# Patient Record
Sex: Female | Born: 1935 | Race: White | Hispanic: No | Smoking: Never smoker
Health system: Southern US, Community
[De-identification: ages and names within clinical notes are randomized; demographics above are authoritative.]

## PROBLEM LIST (undated history)

## (undated) DIAGNOSIS — F419 Anxiety disorder, unspecified: Secondary | ICD-10-CM

## (undated) DIAGNOSIS — K8020 Calculus of gallbladder without cholecystitis without obstruction: Secondary | ICD-10-CM

## (undated) DIAGNOSIS — I714 Abdominal aortic aneurysm, without rupture, unspecified: Secondary | ICD-10-CM

## (undated) DIAGNOSIS — E785 Hyperlipidemia, unspecified: Secondary | ICD-10-CM

## (undated) DIAGNOSIS — B0229 Other postherpetic nervous system involvement: Secondary | ICD-10-CM

## (undated) DIAGNOSIS — K5730 Diverticulosis of large intestine without perforation or abscess without bleeding: Secondary | ICD-10-CM

## (undated) DIAGNOSIS — I639 Cerebral infarction, unspecified: Secondary | ICD-10-CM

## (undated) DIAGNOSIS — I8390 Asymptomatic varicose veins of unspecified lower extremity: Secondary | ICD-10-CM

## (undated) DIAGNOSIS — Z85038 Personal history of other malignant neoplasm of large intestine: Secondary | ICD-10-CM

## (undated) DIAGNOSIS — I10 Essential (primary) hypertension: Secondary | ICD-10-CM

## (undated) DIAGNOSIS — R011 Cardiac murmur, unspecified: Secondary | ICD-10-CM

## (undated) DIAGNOSIS — B0239 Other herpes zoster eye disease: Secondary | ICD-10-CM

## (undated) DIAGNOSIS — M1990 Unspecified osteoarthritis, unspecified site: Secondary | ICD-10-CM

## (undated) DIAGNOSIS — C801 Malignant (primary) neoplasm, unspecified: Secondary | ICD-10-CM

## (undated) HISTORY — DX: Malignant (primary) neoplasm, unspecified: C80.1

## (undated) HISTORY — DX: Unspecified osteoarthritis, unspecified site: M19.90

## (undated) HISTORY — DX: Abdominal aortic aneurysm, without rupture, unspecified: I71.40

## (undated) HISTORY — DX: Abdominal aortic aneurysm, without rupture: I71.4

## (undated) HISTORY — DX: Hyperlipidemia, unspecified: E78.5

## (undated) HISTORY — PX: EYE SURGERY: SHX253

## (undated) HISTORY — PX: COLON SURGERY: SHX602

## (undated) HISTORY — DX: Anxiety disorder, unspecified: F41.9

## (undated) HISTORY — DX: Other herpes zoster eye disease: B02.39

## (undated) HISTORY — DX: Personal history of other malignant neoplasm of large intestine: Z85.038

## (undated) HISTORY — PX: TONSILLECTOMY: SUR1361

## (undated) HISTORY — DX: Cardiac murmur, unspecified: R01.1

## (undated) HISTORY — DX: Diverticulosis of large intestine without perforation or abscess without bleeding: K57.30

## (undated) HISTORY — DX: Calculus of gallbladder without cholecystitis without obstruction: K80.20

## (undated) HISTORY — DX: Asymptomatic varicose veins of unspecified lower extremity: I83.90

## (undated) HISTORY — DX: Essential (primary) hypertension: I10

## (undated) HISTORY — DX: Other postherpetic nervous system involvement: B02.29

---

## 1993-04-22 DIAGNOSIS — Z85038 Personal history of other malignant neoplasm of large intestine: Secondary | ICD-10-CM

## 1993-04-22 HISTORY — DX: Personal history of other malignant neoplasm of large intestine: Z85.038

## 1999-11-20 ENCOUNTER — Encounter: Payer: Self-pay | Admitting: Internal Medicine

## 1999-11-20 ENCOUNTER — Ambulatory Visit (HOSPITAL_COMMUNITY): Admission: RE | Admit: 1999-11-20 | Discharge: 1999-11-20 | Payer: Self-pay | Admitting: Internal Medicine

## 2000-04-22 DIAGNOSIS — B0230 Zoster ocular disease, unspecified: Secondary | ICD-10-CM

## 2000-04-22 HISTORY — DX: Zoster ocular disease, unspecified: B02.30

## 2000-12-08 ENCOUNTER — Other Ambulatory Visit: Admission: RE | Admit: 2000-12-08 | Discharge: 2000-12-08 | Payer: Self-pay | Admitting: Obstetrics and Gynecology

## 2002-02-23 ENCOUNTER — Other Ambulatory Visit: Admission: RE | Admit: 2002-02-23 | Discharge: 2002-02-23 | Payer: Self-pay | Admitting: Obstetrics and Gynecology

## 2003-03-28 ENCOUNTER — Other Ambulatory Visit: Admission: RE | Admit: 2003-03-28 | Discharge: 2003-03-28 | Payer: Self-pay | Admitting: Obstetrics and Gynecology

## 2004-10-04 ENCOUNTER — Ambulatory Visit: Payer: Self-pay | Admitting: Internal Medicine

## 2004-10-16 ENCOUNTER — Other Ambulatory Visit: Admission: RE | Admit: 2004-10-16 | Discharge: 2004-10-16 | Payer: Self-pay | Admitting: Obstetrics and Gynecology

## 2004-12-13 ENCOUNTER — Ambulatory Visit: Payer: Self-pay | Admitting: Gastroenterology

## 2005-01-09 ENCOUNTER — Ambulatory Visit: Payer: Self-pay | Admitting: Gastroenterology

## 2005-03-07 ENCOUNTER — Ambulatory Visit: Payer: Self-pay | Admitting: Internal Medicine

## 2005-07-20 ENCOUNTER — Inpatient Hospital Stay (HOSPITAL_COMMUNITY): Admission: EM | Admit: 2005-07-20 | Discharge: 2005-07-21 | Payer: Self-pay | Admitting: Emergency Medicine

## 2005-07-20 ENCOUNTER — Encounter: Payer: Self-pay | Admitting: Emergency Medicine

## 2005-11-18 ENCOUNTER — Ambulatory Visit: Payer: Self-pay | Admitting: Internal Medicine

## 2005-12-12 ENCOUNTER — Ambulatory Visit: Payer: Self-pay | Admitting: Internal Medicine

## 2006-02-19 ENCOUNTER — Ambulatory Visit: Payer: Self-pay | Admitting: Internal Medicine

## 2006-03-07 ENCOUNTER — Ambulatory Visit: Payer: Self-pay | Admitting: Internal Medicine

## 2006-06-26 ENCOUNTER — Ambulatory Visit: Payer: Self-pay | Admitting: Internal Medicine

## 2006-06-30 ENCOUNTER — Encounter: Payer: Self-pay | Admitting: Internal Medicine

## 2006-07-25 DIAGNOSIS — D126 Benign neoplasm of colon, unspecified: Secondary | ICD-10-CM | POA: Insufficient documentation

## 2006-07-25 DIAGNOSIS — C189 Malignant neoplasm of colon, unspecified: Secondary | ICD-10-CM | POA: Insufficient documentation

## 2006-08-04 ENCOUNTER — Ambulatory Visit: Payer: Self-pay | Admitting: Internal Medicine

## 2006-08-04 LAB — CONVERTED CEMR LAB
Sed Rate: 16 mm/hr (ref 0–25)
Total CK: 48 units/L (ref 7–177)

## 2006-11-07 ENCOUNTER — Encounter: Payer: Self-pay | Admitting: Internal Medicine

## 2006-11-20 ENCOUNTER — Ambulatory Visit: Payer: Self-pay | Admitting: Internal Medicine

## 2006-11-20 DIAGNOSIS — B0229 Other postherpetic nervous system involvement: Secondary | ICD-10-CM | POA: Insufficient documentation

## 2006-11-20 DIAGNOSIS — I10 Essential (primary) hypertension: Secondary | ICD-10-CM

## 2006-11-26 ENCOUNTER — Telehealth (INDEPENDENT_AMBULATORY_CARE_PROVIDER_SITE_OTHER): Payer: Self-pay | Admitting: *Deleted

## 2007-01-26 ENCOUNTER — Ambulatory Visit: Payer: Self-pay | Admitting: Internal Medicine

## 2007-01-26 DIAGNOSIS — E782 Mixed hyperlipidemia: Secondary | ICD-10-CM

## 2007-01-26 DIAGNOSIS — E041 Nontoxic single thyroid nodule: Secondary | ICD-10-CM

## 2007-01-26 DIAGNOSIS — M810 Age-related osteoporosis without current pathological fracture: Secondary | ICD-10-CM | POA: Insufficient documentation

## 2007-01-29 ENCOUNTER — Encounter: Admission: RE | Admit: 2007-01-29 | Discharge: 2007-01-29 | Payer: Self-pay | Admitting: Internal Medicine

## 2007-01-30 ENCOUNTER — Encounter (INDEPENDENT_AMBULATORY_CARE_PROVIDER_SITE_OTHER): Payer: Self-pay | Admitting: *Deleted

## 2007-02-09 ENCOUNTER — Ambulatory Visit: Payer: Self-pay | Admitting: Internal Medicine

## 2007-02-10 ENCOUNTER — Encounter (INDEPENDENT_AMBULATORY_CARE_PROVIDER_SITE_OTHER): Payer: Self-pay | Admitting: *Deleted

## 2007-03-04 ENCOUNTER — Ambulatory Visit: Payer: Self-pay | Admitting: Internal Medicine

## 2007-04-10 ENCOUNTER — Ambulatory Visit: Payer: Self-pay | Admitting: Internal Medicine

## 2007-04-20 ENCOUNTER — Encounter (INDEPENDENT_AMBULATORY_CARE_PROVIDER_SITE_OTHER): Payer: Self-pay | Admitting: *Deleted

## 2007-04-20 LAB — CONVERTED CEMR LAB
Cholesterol: 184 mg/dL (ref 0–200)
HDL: 40.7 mg/dL (ref >39.0)
Total CHOL/HDL Ratio: 4.5

## 2008-01-20 ENCOUNTER — Ambulatory Visit: Payer: Self-pay | Admitting: Internal Medicine

## 2008-06-14 ENCOUNTER — Ambulatory Visit: Payer: Self-pay | Admitting: Internal Medicine

## 2008-06-14 DIAGNOSIS — K5730 Diverticulosis of large intestine without perforation or abscess without bleeding: Secondary | ICD-10-CM | POA: Insufficient documentation

## 2008-06-14 DIAGNOSIS — Z85038 Personal history of other malignant neoplasm of large intestine: Secondary | ICD-10-CM | POA: Insufficient documentation

## 2008-06-16 ENCOUNTER — Ambulatory Visit: Payer: Self-pay | Admitting: Internal Medicine

## 2008-06-20 ENCOUNTER — Encounter: Admission: RE | Admit: 2008-06-20 | Discharge: 2008-06-20 | Payer: Self-pay | Admitting: Internal Medicine

## 2008-06-21 LAB — CONVERTED CEMR LAB
ALT: 15 units/L (ref 0–35)
Basophils Absolute: 0 10*3/uL (ref 0.0–0.1)
Basophils Relative: 0.7 % (ref 0.0–3.0)
Bilirubin, Direct: 0.1 mg/dL (ref 0.0–0.3)
Cholesterol: 161 mg/dL (ref 0–200)
Eosinophils Relative: 5.8 % — ABNORMAL HIGH (ref 0.0–5.0)
HCT: 39 % (ref 36.0–46.0)
HDL: 46.2 mg/dL (ref >39.0)
Hemoglobin: 13.2 g/dL (ref 12.0–15.0)
LDL Cholesterol: 92 mg/dL (ref 0–99)
Lymphocytes Relative: 30.9 % (ref 12.0–46.0)
MCV: 85.1 fL (ref 78.0–100.0)
Monocytes Absolute: 0.4 10*3/uL (ref 0.1–1.0)
Neutro Abs: 2.8 10*3/uL (ref 1.4–7.7)
Neutrophils Relative %: 55.4 % (ref 43.0–77.0)
Platelets: 189 10*3/uL (ref 150–400)
RBC: 4.59 M/uL (ref 3.87–5.11)
RDW: 12.6 % (ref 11.5–14.6)
TSH: 1.06 microintl units/mL (ref 0.35–5.50)
VLDL: 22 mg/dL (ref 0–40)

## 2008-06-22 ENCOUNTER — Encounter (INDEPENDENT_AMBULATORY_CARE_PROVIDER_SITE_OTHER): Payer: Self-pay | Admitting: *Deleted

## 2008-10-25 ENCOUNTER — Encounter: Payer: Self-pay | Admitting: Internal Medicine

## 2009-03-06 ENCOUNTER — Ambulatory Visit: Payer: Self-pay | Admitting: Internal Medicine

## 2009-09-06 ENCOUNTER — Ambulatory Visit: Payer: Self-pay | Admitting: Internal Medicine

## 2009-09-21 ENCOUNTER — Ambulatory Visit: Payer: Self-pay | Admitting: Internal Medicine

## 2009-09-21 ENCOUNTER — Encounter (INDEPENDENT_AMBULATORY_CARE_PROVIDER_SITE_OTHER): Payer: Self-pay | Admitting: *Deleted

## 2009-09-21 LAB — CONVERTED CEMR LAB
OCCULT 1: NEGATIVE
OCCULT 2: NEGATIVE
OCCULT 3: NEGATIVE

## 2009-09-26 ENCOUNTER — Encounter: Admission: RE | Admit: 2009-09-26 | Discharge: 2009-09-26 | Payer: Self-pay | Admitting: Internal Medicine

## 2009-11-22 ENCOUNTER — Encounter: Payer: Self-pay | Admitting: Internal Medicine

## 2010-02-07 ENCOUNTER — Ambulatory Visit: Payer: Self-pay | Admitting: Internal Medicine

## 2010-04-09 ENCOUNTER — Ambulatory Visit: Payer: Self-pay | Admitting: Internal Medicine

## 2010-04-09 DIAGNOSIS — R109 Unspecified abdominal pain: Secondary | ICD-10-CM | POA: Insufficient documentation

## 2010-04-09 DIAGNOSIS — Z8719 Personal history of other diseases of the digestive system: Secondary | ICD-10-CM

## 2010-04-12 ENCOUNTER — Telehealth (INDEPENDENT_AMBULATORY_CARE_PROVIDER_SITE_OTHER): Payer: Self-pay | Admitting: *Deleted

## 2010-04-12 ENCOUNTER — Ambulatory Visit: Payer: Self-pay | Admitting: Internal Medicine

## 2010-04-12 ENCOUNTER — Encounter: Payer: Self-pay | Admitting: Internal Medicine

## 2010-04-12 ENCOUNTER — Encounter
Admission: RE | Admit: 2010-04-12 | Discharge: 2010-04-12 | Payer: Self-pay | Source: Home / Self Care | Attending: Internal Medicine | Admitting: Internal Medicine

## 2010-04-12 LAB — CONVERTED CEMR LAB
AST: 92 units/L — ABNORMAL HIGH (ref 0–37)
Basophils Relative: 0.8 % (ref 0.0–3.0)
Eosinophils Relative: 10.5 % — ABNORMAL HIGH (ref 0.0–5.0)
HCT: 39.2 % (ref 36.0–46.0)
HCV Ab: NEGATIVE
Hep A IgM: NEGATIVE
MCHC: 33.6 g/dL (ref 30.0–36.0)
Monocytes Relative: 12 % (ref 3.0–12.0)
Neutrophils Relative %: 21.5 % — ABNORMAL LOW (ref 43.0–77.0)
Platelets: 182 10*3/uL (ref 150.0–400.0)
RBC: 4.55 M/uL (ref 3.87–5.11)
RDW: 14.2 % (ref 11.5–14.6)
Total Bilirubin: 1.1 mg/dL (ref 0.3–1.2)
Total Protein: 6.8 g/dL (ref 6.0–8.3)

## 2010-04-13 ENCOUNTER — Ambulatory Visit: Payer: Self-pay | Admitting: Internal Medicine

## 2010-04-13 DIAGNOSIS — R7402 Elevation of levels of lactic acid dehydrogenase (LDH): Secondary | ICD-10-CM | POA: Insufficient documentation

## 2010-04-13 DIAGNOSIS — R740 Nonspecific elevation of levels of transaminase and lactic acid dehydrogenase [LDH]: Secondary | ICD-10-CM

## 2010-04-13 DIAGNOSIS — I714 Abdominal aortic aneurysm, without rupture: Secondary | ICD-10-CM

## 2010-04-13 DIAGNOSIS — K8020 Calculus of gallbladder without cholecystitis without obstruction: Secondary | ICD-10-CM | POA: Insufficient documentation

## 2010-04-13 LAB — CONVERTED CEMR LAB
AST: 30 units/L (ref 0–37)
Alkaline Phosphatase: 112 units/L (ref 39–117)
Bilirubin, Direct: 0.2 mg/dL (ref 0.0–0.3)

## 2010-04-18 ENCOUNTER — Telehealth: Payer: Self-pay | Admitting: Internal Medicine

## 2010-04-24 ENCOUNTER — Encounter (INDEPENDENT_AMBULATORY_CARE_PROVIDER_SITE_OTHER): Payer: Self-pay | Admitting: *Deleted

## 2010-04-24 ENCOUNTER — Ambulatory Visit
Admission: RE | Admit: 2010-04-24 | Discharge: 2010-04-24 | Payer: Self-pay | Source: Home / Self Care | Attending: Internal Medicine | Admitting: Internal Medicine

## 2010-04-24 LAB — CONVERTED CEMR LAB
OCCULT 2: NEGATIVE
OCCULT 3: NEGATIVE

## 2010-05-07 ENCOUNTER — Ambulatory Visit: Admit: 2010-05-07 | Payer: Self-pay | Admitting: Surgery

## 2010-05-07 ENCOUNTER — Ambulatory Visit
Admission: RE | Admit: 2010-05-07 | Discharge: 2010-05-07 | Payer: Self-pay | Source: Home / Self Care | Attending: Surgery | Admitting: Surgery

## 2010-05-15 NOTE — Procedures (Unsigned)
DUPLEX ULTRASOUND OF ABDOMINAL AORTA  INDICATION:  Followup abdominal aortic aneurysm.  HISTORY: Diabetes:  No. Cardiac:  No. Hypertension:  Yes. Smoking:  No. Connective Tissue Disorder: Family History:  No. Previous Surgery:  No.  DUPLEX EXAM:         AP (cm)                   TRANSVERSE (cm) Proximal             2.1 Cm                    2.7 cm Mid                  3.1 cm                    3.0 cm Distal               3.8 cm                    4.3 cm Right Iliac          1.9 cm                    1.5 cm Left Iliac           1.6 cm                    1.8 cm  PREVIOUS:  Date:  AP:  3.8  TRANSVERSE:  4.1  IMPRESSION:  Stable appearing abdominal aortic aneurysm within the mid distal aorta measuring 4.3 x 3.8 cm with intraluminal thrombus and atherosclerosis.  Bilateral ectatic iliacs with atherosclerosis seen.  ___________________________________________ V. Charlena Cross, MD  OD/MEDQ  D:  05/07/2010  T:  05/07/2010  Job:  [id number]

## 2010-05-20 LAB — CONVERTED CEMR LAB
ALT: 21 units/L (ref 0–40)
AST: 21 units/L (ref 0–37)
AST: 28 units/L (ref 0–37)
Alkaline Phosphatase: 70 units/L (ref 39–117)
BUN: 13 mg/dL (ref 6–23)
BUN: 8 mg/dL (ref 6–23)
Basophils Absolute: 0.1 10*3/uL (ref 0.0–0.1)
Basophils Relative: 0.7 % (ref 0.0–3.0)
Basophils Relative: 0.9 % (ref 0.0–1.0)
CO2: 34 meq/L — ABNORMAL HIGH (ref 19–32)
CO2: 36 meq/L — ABNORMAL HIGH (ref 19–32)
Calcium: 10.3 mg/dL (ref 8.4–10.5)
Calcium: 10.4 mg/dL (ref 8.4–10.5)
Cholesterol, target level: 200 mg/dL
Creatinine, Ser: 0.7 mg/dL (ref 0.4–1.2)
Creatinine, Ser: 0.8 mg/dL (ref 0.4–1.2)
Direct LDL: 82.1 mg/dL
Eosinophils Absolute: 0.4 10*3/uL (ref 0.0–0.6)
GFR calc Af Amer: 88 mL/min
GFR calc non Af Amer: 73 mL/min
Glucose, Bld: 70 mg/dL (ref 70–99)
Glucose, Bld: 81 mg/dL (ref 70–99)
HCT: 39.6 % (ref 36.0–46.0)
HDL goal, serum: 40 mg/dL
HDL: 37.8 mg/dL — ABNORMAL LOW (ref >39.0)
HDL: 51 mg/dL (ref >39.00)
Hemoglobin: 13.2 g/dL (ref 12.0–15.0)
Hemoglobin: 13.7 g/dL (ref 12.0–15.0)
LDL Goal: 130 mg/dL
MCV: 87.1 fL (ref 78.0–100.0)
Monocytes Absolute: 0.4 10*3/uL (ref 0.2–0.7)
Monocytes Relative: 6.4 % (ref 3.0–11.0)
Monocytes Relative: 7.1 % (ref 3.0–12.0)
Neutrophils Relative %: 62.6 % (ref 43.0–77.0)
Potassium: 3.3 meq/L — ABNORMAL LOW (ref 3.5–5.1)
RBC: 4.62 M/uL (ref 3.87–5.11)
RDW: 13.1 % (ref 11.5–14.6)
RDW: 13.9 % (ref 11.5–14.6)
Sodium: 146 meq/L — ABNORMAL HIGH (ref 135–145)
TSH: 0.91 microintl units/mL (ref 0.35–5.50)
Total CHOL/HDL Ratio: 3
Total CHOL/HDL Ratio: 6.5
Vit D, 25-Hydroxy: 44 ng/mL
WBC: 6.3 10*3/uL (ref 4.5–10.5)
WBC: 6.7 10*3/uL (ref 4.5–10.5)

## 2010-05-24 NOTE — Assessment & Plan Note (Signed)
Summary: DISCUSS ABD PAIN AND TARRY STOOL/KB   Vital Signs:  Patient profile:   77 year old female Weight:      125.8 pounds BMI:     22.37 Temp:     98.1 degrees F oral Pulse rate:   76 / minute Resp:     16 per minute BP sitting:   150 / 90  (left arm) Cuff size:   regular  Vitals Entered By: Shonna Chock CMA (April 09, 2010 2:48 PM) CC: On Friday night patient experienced abdominal pain (center) that lasted til Sat afternoon. Patient took [facility] Saturday and then noticed blacy stools, patient now with diarrhea , Abdominal pain   CC:  On Friday night patient experienced abdominal pain (center) that lasted til Sat afternoon. Patient took [facility] Saturday and then noticed blacy stools, patient now with diarrhea , and Abdominal pain.  History of Present Illness: Abdominal Pain      This is an 77 year old woman who presents with Abdominal pain; onset 12/16 @ 4 pm.  The patient reports nausea, diarrhea ( as of today), melena  on 12/16 only ( denies Pepto Bismol) , and anorexia, but denies vomiting, constipation, hematochezia, and hematemesis.  The location of the pain is epigastric.  The pain is described as intermittent, sharp, and non  radiating. Her daughter wanted her to go to ER, but she declinedThe patient denies the following symptoms: fever, dysuria, chest pain, jaundice, dark urine, and vaginal bleeding.  The pain is worse with sitting up.  The pain is better with antacids, Maalox 12/17& 18 . She has been on Ranitidine twice a day.Marland KitchenPMH of colon cancer 1995 & DUD remotely. Diverticulosis @ colonoscopy by Dr Arlyce Dice.  Current Medications (verified): 1)  Gabapentin 400 Mg Caps (Gabapentin) .Marland Kitchen.. 1 By Mouth Two Times A Day, 2)  Calcium/vitamin D 3)  Mvi 4)  Benazepril Hcl 40 Mg  Tabs (Benazepril Hcl) .Marland Kitchen.. 1 By Mouth Once Daily 5)  Aleve 220 Mg  Tabs (Naproxen Sodium) .... Prn 6)  Alaway 0.025 % Soln (Ketotifen Fumarate) .... As Directed 7)  Pravastatin Sodium 20 Mg  Tabs (Pravastatin  Sodium) .Marland Kitchen.. 1 At Bedtime 8)  Fish Oil 1000 Mg Caps (Omega-3 Fatty Acids) .Marland Kitchen.. 1 By Mouth Once Daily 9)  Systane Ultra 10)  Vitamin E 400 Unit Caps (Vitamin E) .Marland Kitchen.. 1 By Mouth Once Daily 11)  Vitamin D3 1000 Unit Caps (Cholecalciferol) .Marland Kitchen.. 1 By Mouth Once Daily 12)  Ranitidine Hcl 150 Mg Tabs (Ranitidine Hcl) .Marland Kitchen.. 1 Two Times A Day Pre Meals 13)  Probiotic  Caps (Probiotic Product) .Marland Kitchen.. 1 By Mouth Once Daily  Allergies: 1)  ! Pcn  Past History:  Past Medical History: Hyperlipidemia:[place] LDL goal = < 130 Hypertension Osteoporosis abnormal PAP 1997 Colon cancer, PMH  of 1995; Ophthalmic  herpes zoster 2002,Dr Elmer Picker Post herpetic neuralgia Diverticulosis, colon Thyroid nodule  Past Surgical History: Tonsillectomy  Partial Colectomy 1995;Radiation Colonoscopy : diverticulosis  , Dr Arlyce Dice Cataract extraction , OS G 2 P 2  Review of Systems General:  Denies chills and sweats. ENT:  Complains of hoarseness; denies difficulty swallowing. GI:  No dysphagia. GU:  Denies discharge and hematuria.  Physical Exam  General:  Appears uncomfortable but in no acute distress; alert,appropriate and cooperative throughout examination Eyes:  No corneal or conjunctival inflammation noted. Perrla. No icterus Mouth:  Oral mucosa and oropharynx without lesions or exudates.  No pharyngeal erythema.   Lungs:  Normal respiratory effort, chest expands symmetrically. Lungs  are clear to auscultation, no crackles or wheezes. Heart:  normal rate, regular rhythm, no gallop, no rub, no JVD, no HJR, and grade 1 /6 systolic murmur.   Abdomen:  Bowel sounds positive,abdomen soft  but tender  diffusely, especially suprapubic area without masses, organomegaly or hernias noted.Aorta palpable , non tender Skin:  Intact without suspicious lesions or rashes. No jaundice Cervical Nodes:  No lymphadenopathy noted Axillary Nodes:  No palpable lymphadenopathy Psych:  memory intact for recent and remote,  normally interactive, and good eye contact.     Impression & Recommendations:  Problem # 1:  ABDOMINAL PAIN (ICD-789.00)  Orders: Venipuncture (16109) TLB-CBC Platelet - w/Differential (85025-CBCD) TLB-Hepatic/Liver Function Pnl (80076-HEPATIC) TLB-Amylase (82150-AMYL) TLB-Lipase (83690-LIPASE) Radiology Referral (Radiology)  Problem # 2:  MELENA, HX OF (ICD-V12.79)  12/16  Orders: Venipuncture (60454) TLB-CBC Platelet - w/Differential (85025-CBCD) Radiology Referral (Radiology)  Problem # 3:  DIVERTICULOSIS, COLON (ICD-562.10)  Problem # 4:  COLON CANCER, HX OF (ICD-V10.05)  1995  Orders: Radiology Referral (Radiology)  Complete Medication List: 1)  Gabapentin 400 Mg Caps (Gabapentin) .Marland Kitchen.. 1 by mouth two times a day, 2)  Calcium/vitamin D  3)  Mvi  4)  Benazepril Hcl 40 Mg Tabs (Benazepril hcl) .Marland Kitchen.. 1 by mouth once daily 5)  Aleve 220 Mg Tabs (Naproxen sodium) .... Prn 6)  Alaway 0.025 % Soln (Ketotifen fumarate) .... As directed 7)  Pravastatin Sodium 20 Mg Tabs (Pravastatin sodium) .Marland Kitchen.. 1 at bedtime 8)  Fish Oil 1000 Mg Caps (Omega-3 fatty acids) .Marland Kitchen.. 1 by mouth once daily 9)  Systane Ultra  10)  Vitamin E 400 Unit Caps (Vitamin e) .Marland Kitchen.. 1 by mouth once daily 11)  Vitamin D3 1000 Unit Caps (Cholecalciferol) .Marland Kitchen.. 1 by mouth once daily 12)  Ranitidine Hcl 150 Mg Tabs (Ranitidine hcl) .Marland Kitchen.. 1 two times a day pre meals 13)  Probiotic Caps (Probiotic product) .Marland Kitchen.. 1 by mouth once daily 14)  Nexium 40 Mg Cpdr (Esomeprazole magnesium) .Marland Kitchen.. 1 two times a day pre meals ; hold ranitidine  Patient Instructions: 1)  Drink clear liquids only for the next 24 hours, then slowly add other liquids and food as you  tolerate them.Stop Ranitidine ; take samples. Complete stool cards. To ER if pain progresses . Prescriptions: NEXIUM 40 MG CPDR (ESOMEPRAZOLE MAGNESIUM) 1 two times a day pre meals ; hold Ranitidine  #20 x 0   Entered and Authorized by:   Marga Melnick MD   Signed by:    Marga Melnick MD on 04/09/2010   Method used:   Samples Given   RxID:   [id number]    Orders Added: 1)  Est. Patient Level IV [30865] 2)  Venipuncture [78469] 3)  TLB-CBC Platelet - w/Differential [85025-CBCD] 4)  TLB-Hepatic/Liver Function Pnl [80076-HEPATIC] 5)  TLB-Amylase [82150-AMYL] 6)  TLB-Lipase [83690-LIPASE] 7)  Radiology Referral [Radiology]  Appended Document: DISCUSS ABD PAIN AND TARRY STOOL/KB  Laboratory Results   Urine Tests   Date/Time Reported: April 09, 2010 3:51 PM   Routine Urinalysis   Color: yellow Appearance: Clear Glucose: negative   (Normal Range: Negative) Bilirubin: negative   (Normal Range: Negative) Ketone: negative   (Normal Range: Negative) Spec. Gravity: <1.005   (Normal Range: 1.003-1.035) Blood: negative   (Normal Range: Negative) pH: 6.5   (Normal Range: 5.0-8.0) Protein: negative   (Normal Range: Negative) Urobilinogen: negative   (Normal Range: 0-1) Nitrite: negative   (Normal Range: Negative) Leukocyte Esterace: negative   (Normal Range: Negative)    Comments: Rene Kocher  Hoops  April 09, 2010 3:51 PM

## 2010-05-24 NOTE — Assessment & Plan Note (Signed)
Summary: roa per refills//lch   Vital Signs:  Patient profile:   77 year old female Weight:      129.4 pounds BMI:     23.01 Temp:     97.8 degrees F oral Pulse rate:   80 / minute Resp:     16 per minute BP sitting:   152 / 88  (left arm) Cuff size:   regular  Vitals Entered By: Shonna Chock (Sep 06, 2009 10:40 AM) CC: Follow-up on meds and chest pain, Hypertension Management, Lipid Management Comments REVIEWED MED LIST, PATIENT AGREED DOSE AND INSTRUCTION CORRECT    CC:  Follow-up on meds and chest pain, Hypertension Management, and Lipid Management.  History of Present Illness: Rebecca Knight is here for med refills; she has had intermittent "bubble" in the  LUQ for several months.She lost her husband in 04/2009; she is caring for her daughter who is totally disabled due to a malignant cns tumor.  Hypertension History:      She complains of peripheral edema and neurologic problems, but denies headache, chest pain, palpitations, dyspnea with exertion, orthopnea, PND, visual symptoms, syncope, and side effects from treatment.  BP is well controlled @ home.        Positive major cardiovascular risk factors include female age 70 years old or older, hyperlipidemia, and hypertension.  Negative major cardiovascular risk factors include no history of diabetes, negative family history for ischemic heart disease, and non-tobacco-user status.        Further assessment for target organ damage reveals no history of ASHD, stroke/TIA, or peripheral vascular disease.    Lipid Management History:      Positive NCEP/ATP III risk factors include female age 23 years old or older and hypertension.  Negative NCEP/ATP III risk factors include no history of early menopause without estrogen hormone replacement, non-diabetic, no family history for ischemic heart disease, non-tobacco-user status, no ASHD (atherosclerotic heart disease), no prior stroke/TIA, no peripheral vascular disease, and no history of aortic  aneurysm.      Allergies: 1)  ! Pcn  Past History:  Past Medical History: Hyperlipidemia:[place] LDL goal = < 130 Hypertension Osteoporosis abnormal PAP 1997 Colon cancer, hx of 1995; Ophthalmic  herpes zoster 2002,Dr Elmer Picker Post herpetic neuralgia Diverticulosis, colon Thyroid nodule  Past Surgical History: Tonsillectomy  Partial Colectomy 1995;Radiation Colonoscopy 2006: diverticulosis & 2009 , Dr Arlyce Dice, due 2013 Cataract extraction , OS G 2 P 2  Family History: Father: MI @ 5 Mother: CVA in 25s Siblings: bro :DM, MI in 15s; twin: MI,DM @ 17; daughter: cns tumor  Social History: no diet Retired Never Smoked Alcohol use-no Widow  Review of Systems Eyes:  Denies blurring, double vision, and vision loss-both eyes. ENT:  Denies difficulty swallowing and hoarseness. CV:  Complains of swelling of feet; denies leg cramps with exertion. Resp:  Denies cough, shortness of breath, and sputum productive. GI:  See HPI; Complains of abdominal pain and gas; denies bloody stools, diarrhea, and indigestion; No dysphagia. GU:  Complains of incontinence; Dr Vincente Poli has Rxed meds for incontinence. Neuro:  Complains of numbness; denies tingling; Post herpetic neuralgia as burning, itching & numbness in L temple; Gabapentin is beneficial. Psych:  Denies anxiety, depression, easily angered, easily tearful, and irritability.  Physical Exam  General:  Appears younger than age,in no acute distress; alert,appropriate and cooperative throughout examination Head:  Normocephalic and atraumatic without obvious abnormalities. No apparent alopecia or balding. Eyes:  No corneal or conjunctival inflammation noted. Slight ptosis OD . No  icterus. Nose:  External nasal examination shows no deformity or inflammation. Nasal mucosa are pink and moist without lesions or exudates. Mouth:  Oral mucosa and oropharynx without lesions or exudates.  Eroded L maxillary tooth. Prominent posterior maxillary  mucosa  bilaterally Neck:  ? R thyroid nodule Lungs:  Normal respiratory effort, chest expands symmetrically. Lungs are clear to auscultation, no crackles or wheezes. Heart:  normal rate, regular rhythm, no gallop, no rub, no JVD, no HJR, and grade 1 /6 systolic murmur.   Abdomen:  Bowel sounds positive,abdomen soft and non-tender without masses, organomegaly or hernias noted. Aorta 3.5 cm w/o bruit. Rectal:  Given stool cards Genitalia:  Dr Vincente Poli Pulses:  R and L carotid,radial,dorsalis pedis and posterior tibial pulses are full and equal bilaterally Extremities:  No clubbing, cyanosis, or deformity noted . Slight lipedema of ankles Neurologic:  alert & oriented X3 and DTRs symmetrical and normal.   Skin:  Intact without suspicious lesions or rashes. No jaundice Cervical Nodes:  No lymphadenopathy noted Axillary Nodes:  No palpable lymphadenopathy Psych:  memory intact for recent and remote, normally interactive, good eye contact, not anxious appearing, and not depressed appearing.     Impression & Recommendations:  Problem # 1:  ABDOMINAL PAIN, LEFT UPPER QUADRANT (ICD-789.02)  R/O Hiatal Hernia  Orders: Venipuncture (16109) TLB-CBC Platelet - w/Differential (85025-CBCD) Prescription Created Electronically [id number])  Problem # 2:  COLON CANCER, HX OF (ICD-V10.05) Colonoscopy up to date  Problem # 3:  THYROID NODULE, RIGHT (ICD-241.0)  Orders: Venipuncture (09811) TLB-TSH (Thyroid Stimulating Hormone) (91478-GNF) Radiology Referral (Radiology)  Problem # 4:  HYPERLIPIDEMIA (ICD-272.2)  Her updated medication list for this problem includes:    Pravastatin Sodium 20 Mg Tabs (Pravastatin sodium) .Marland Kitchen... 1 at bedtime  Orders: Venipuncture (62130) TLB-Lipid Panel (80061-LIPID) TLB-Hepatic/Liver Function Pnl (80076-HEPATIC) Prescription Created Electronically [id number])  Problem # 5:  HYPERTENSION, ESSENTIAL NOS (ICD-401.9)  controlled by history Her updated medication list  for this problem includes:    Benazepril Hcl 40 Mg Tabs (Benazepril hcl) .Marland Kitchen... 1 by mouth once daily  Orders: EKG w/ Interpretation (93000) Venipuncture (46962) TLB-BMP (Basic Metabolic Panel-BMET) (80048-METABOL) Prescription Created Electronically [id number])  Problem # 6:  POSTHERPETIC NEURALGIA (ICD-053.19)  Orders: Prescription Created Electronically [phone])  Problem # 7:  OSTEOPOROSIS NOS (ICD-733.00)  Orders: Venipuncture (01027) T-Vitamin D (25-Hydroxy) (25366-44034)  Complete Medication List: 1)  Gabapentin 400 Mg Caps (Gabapentin) .Marland Kitchen.. 1 by mouth two times a day, 2)  Calcium/vitamin D  3)  Mvi  4)  Benazepril Hcl 40 Mg Tabs (Benazepril hcl) .Marland Kitchen.. 1 by mouth once daily 5)  Aleve 220 Mg Tabs (Naproxen sodium) .... Prn 6)  Alaway 0.025 % Soln (Ketotifen fumarate) .... As directed 7)  Pravastatin Sodium 20 Mg Tabs (Pravastatin sodium) .Marland Kitchen.. 1 at bedtime 8)  Fish Oil 1000 Mg Caps (Omega-3 fatty acids) .Marland Kitchen.. 1 by mouth once daily 9)  Systane Ultra  10)  Vitamin E 400 Unit Caps (Vitamin e) .Marland Kitchen.. 1 by mouth once daily 11)  Vitamin D3 1000 Unit Caps (Cholecalciferol) .Marland Kitchen.. 1 by mouth once daily 12)  New Ultimate Bladder Support  .Marland KitchenMarland Kitchen. 1 by mouth once daily 13)  Ranitidine Hcl 150 Mg Tabs (Ranitidine hcl) .Marland Kitchen.. 1 two times a day pre meals  Hypertension Assessment/Plan:      The patient's hypertensive risk group is category B: At least one risk factor (excluding diabetes) with no target organ damage.  Her calculated 10 year risk of coronary heart disease is 11 %.  Today's  blood pressure is 152/88.    Lipid Assessment/Plan:      Based on NCEP/ATP III, the patient's risk factor category is "2 or more risk factors and a calculated 10 year CAD risk of < 20%".  The patient's lipid goals are as follows: Total cholesterol goal is 200; LDL cholesterol goal is 130; HDL cholesterol goal is 40; Triglyceride goal is 150.  Her LDL cholesterol goal has been met.  Secondary causes for hyperlipidemia have  been ruled out.  She has been counseled on adjunctive measures for lowering her cholesterol and has been provided with dietary instructions.    Patient Instructions: 1)  Complete stool cards.Avoid foods high in acid (tomatoes, citrus juices, spicy foods). Avoid eating within two hours of lying down or before exercising. Do not over eat; try smaller more frequent meals. Elevate head of bed twelve inches when sleeping. Prescriptions: PRAVASTATIN SODIUM 20 MG  TABS (PRAVASTATIN SODIUM) 1 at bedtime  #90 x 3   Entered and Authorized by:   Marga Melnick MD   Signed by:   Marga Melnick MD on 09/06/2009   Method used:   [facility] ...       [facility] [address] [phone]* (retail)       [address]       [place], [place]  [address]       Ph: [phone] or [phone]       Fax: [phone]   RxID:   [id number] BENAZEPRIL HCL 40 MG  TABS (BENAZEPRIL HCL) 1 by mouth once daily  #90 x 3   Entered and Authorized by:   Marga Melnick MD   Signed by:   Marga Melnick MD on 09/06/2009   Method used:   [facility]axed to ...       [facility] [address] [phone]* (retail)       [address]       [place], [place]  [address]       Ph: [phone] or [phone]       Fax: [phone]   RxID:   [id number] RANITIDINE HCL 150 MG TABS (RANITIDINE HCL) 1 two times a day pre meals  #60 x 1   Entered and Authorized by:   Marga Melnick MD   Signed by:   Marga Melnick MD on 09/06/2009   Method used:   [facility]axed to ...       [facility] [address] [phone]* (retail)       [address]       [place], [place]  [address]       Ph: [phone] or [phone]       Fax: [phone]   RxID:   [id number] GABAPENTIN 400 MG CAPS (GABAPENTIN) 1 by mouth two times a day,  #180 x 3   Entered and Authorized by:   Marga Melnick MD   Signed by:   Marga Melnick MD on 09/06/2009   Method used:   Faxed to ...       [facility] [address] [phone]*  (retail)       [address]       [place], [place]  [address]       Ph: [phone] or [phone]       Fax: [phone]   RxID:   [id number]

## 2010-05-24 NOTE — Letter (Signed)
Summary: Results Follow up Letter  [facility] [place]  [address] [place], [place] [address]   Phone: [phone]  Fax: [phone]    04/24/2010 MRN: [id number]  Rebecca Knight [address] [place], [place]  [address]  Dear [name],  The following are the results of your recent test(s):  Test         Result    Pap Smear:        Normal _____  Not Normal _____ Comments: ______________________________________________________ Cholesterol: LDL(Bad cholesterol):         Your goal is less than:         HDL (Good cholesterol):       Your goal is more than: Comments:  ______________________________________________________ Mammogram:        Normal _____  Not Normal _____ Comments:  ___________________________________________________________________ Hemoccult:        Normal _X____  Not normal _______ Comments:    _____________________________________________________________________ Other Tests:    We routinely do not discuss normal results over the telephone.  If you desire a copy of the results, or you have any questions about this information we can discuss them at your next office visit.   Sincerely,

## 2010-05-24 NOTE — Progress Notes (Signed)
Summary: Nexium rx  Phone Note Other Incoming   Summary of Call: Patient walked in stating that she was given samples of Nexium and is now out. I asked the pt if it was working for her and she stated "I don't have any problems". [facility]  Do you want the patient to continue two times a day therapy or send her prescription for once daily? Initial call taken by: Lucious Groves CMA,  April 18, 2010 2:38 PM  Follow-up for Phone Call        Change to Omeprazole 20 mg each am , 30 min pre b'fast # 30  RX2. This wil be cheaper than Nexium & should be effective Follow-up by: Marga Melnick MD,  April 18, 2010 3:00 PM  Additional Follow-up for Phone Call Additional follow up Details #1::        Lm with spouse for patient to call back to office. Lucious Groves CMA  April 18, 2010 3:19 PM   Patient notified. Lucious Groves CMA  April 18, 2010 4:41 PM     New/Updated Medications: OMEPRAZOLE 20 MG CPDR (OMEPRAZOLE) 1 po each am , 30 min pre b'fast Prescriptions: OMEPRAZOLE 20 MG CPDR (OMEPRAZOLE) 1 po each am , 30 min pre b'fast  #30 x 2   Entered by:   Lucious Groves CMA   Authorized by:   Marga Melnick MD   Signed by:   Lucious Groves CMA on 04/18/2010   Method used:   [facility]ronically to        [facility] [address] [phone]* (retail)       [address]       [place], [place]  [address]       Ph: [phone] or [phone]       Fax: [phone]   RxID:   [id number]

## 2010-05-24 NOTE — Progress Notes (Signed)
Summary: Lab results   Phone Note Outgoing Call Call back at Home Phone [phone]   Call placed by: Shonna Chock CMA,  April 12, 2010 9:24 AM Call placed to: Patient Summary of Call: Spoke with female to have him tell patient to call our office when avaliable. Female indicated patient was gone to get [facility]. Reason for call: Please have repeat fasting liver panel plus acute hepatitis panel drawn this am @ [facility]. Please see me Thurs afternoon.(790.4,789.0)  **I will have patient schedule fasting labs and appointment with [facility] tomorrow** Shonna Chock CMA  April 12, 2010 9:25 AM   Follow-up for Phone Call        Patient called back, Jamesetta So scheduled patient for fasting labs for [facility] and appointment with [facility] tomorrow Follow-up by: Shonna Chock CMA,  April 12, 2010 10:54 AM

## 2010-05-24 NOTE — Letter (Signed)
Summary: [facility]   Imported By: Lanelle Bal 12/07/2009 10:44:04  _____________________________________________________________________  External Attachment:    Type:   Image     Comment:   External Document

## 2010-05-24 NOTE — Assessment & Plan Note (Signed)
Summary: PER Avanell Banwart//PH   Vital Signs:  Patient profile:   77 year old female Weight:      127.2 pounds BMI:     22.61 Temp:     98.0 degrees F oral Pulse rate:   88 / minute Resp:     15 per minute BP sitting:   124 / 80  (left arm) Cuff size:   regular  Vitals Entered By: Shonna Chock CMA (April 13, 2010 2:37 PM) CC: Follow-up on recent labs and U/S (copies given)    CC:  Follow-up on recent labs and U/S (copies given) .  History of Present Illness: Abdominal pain resolved; serial labs reviewed . LFTs dramatically improved & Hepatitis Panel was normal. [place] results suggest passage of small gallstone. AAA present. Risk & options discussed. No PMH of aneurysm. BP @ home typically 127/84.  Current Medications (verified): 1)  Gabapentin 400 Mg Caps (Gabapentin) .Marland Kitchen.. 1 By Mouth Two Times A Day, 2)  Calcium/vitamin D 3)  Mvi 4)  Benazepril Hcl 40 Mg  Tabs (Benazepril Hcl) .Marland Kitchen.. 1 By Mouth Once Daily 5)  Aleve 220 Mg  Tabs (Naproxen Sodium) .... Prn 6)  [place] 0.025 % Soln (Ketotifen Fumarate) .... As Directed 7)  Pravastatin Sodium 20 Mg  Tabs (Pravastatin Sodium) .Marland Kitchen.. 1 At Bedtime 8)  Fish Oil 1000 Mg Caps (Omega-3 Fatty Acids) .Marland Kitchen.. 1 By Mouth Once Daily 9)  Systane Ultra 10)  Vitamin E 400 Unit Caps (Vitamin E) .Marland Kitchen.. 1 By Mouth Once Daily 11)  Vitamin D3 1000 Unit Caps (Cholecalciferol) .Marland Kitchen.. 1 By Mouth Once Daily 12)  Probiotic  Caps (Probiotic Product) .Marland Kitchen.. 1 By Mouth Once Daily 13)  Nexium 40 Mg Cpdr (Esomeprazole Magnesium) .Marland Kitchen.. 1 Two Times A Day Pre Meals ; Hold Ranitidine  Allergies: 1)  ! Pcn  Past History:  Past Medical History: Hyperlipidemia:[place] LDL goal = < 130 Hypertension Osteoporosis abnormal PAP 1997 Colon cancer, PMH  of 1995; Ophthalmic  herpes zoster 2002,Dr Elmer Picker Post herpetic neuralgia Diverticulosis, colon Thyroid nodule; AAA 03/2010 on [place]  Physical Exam  General:  in no acute distress; alert,appropriate and cooperative throughout  examination Eyes:  No corneal or conjunctival inflammation noted.No icterus Lungs:  Normal respiratory effort, chest expands symmetrically. Lungs are clear to auscultation, no crackles or wheezes. Heart:  normal rate, regular rhythm, no gallop, no rub, no JVD, no HJR, and grade1  /6 systolic murmur.   Abdomen:  Bowel sounds positive,abdomen soft and non-tender without  organomegaly or hernias noted.. Aorta palpable    Impression & Recommendations:  Problem # 1:  ABDOMINAL PAIN (ICD-789.00) resolved, probably due to passage of small gallstone  Problem # 2:  NONSPEC ELEVATION OF LEVELS OF TRANSAMINASE/LDH (ICD-790.4) resolving  Problem # 3:  CHOLELITHIASIS (ICD-574.20) Multiple,small   Problem # 4:  ABDOMINAL ANEURYSM WITHOUT MENTION OF RUPTURE (ICD-441.4)  incidental finding  Orders: Misc. Referral (Misc. Ref)  Complete Medication List: 1)  Gabapentin 400 Mg Caps (Gabapentin) .Marland Kitchen.. 1 by mouth two times a day, 2)  Calcium/vitamin D  3)  Mvi  4)  Benazepril Hcl 40 Mg Tabs (Benazepril hcl) .Marland Kitchen.. 1 by mouth once daily 5)  Aleve 220 Mg Tabs (Naproxen sodium) .... Prn 6)  [place]away 0.025 % Soln (Ketotifen fumarate) .... As directed 7)  Pravastatin Sodium 20 Mg Tabs (Pravastatin sodium) .Marland Kitchen.. 1 at bedtime 8)  Fish Oil 1000 Mg Caps (Omega-3 fatty acids) .Marland Kitchen.. 1 by mouth once daily 9)  Systane Ultra  10)  Vitamin E 400  Unit Caps (Vitamin e) .Marland Kitchen.. 1 by mouth once daily 11)  Vitamin D3 1000 Unit Caps (Cholecalciferol) .Marland Kitchen.. 1 by mouth once daily 12)  Probiotic Caps (Probiotic product) .Marland Kitchen.. 1 by mouth once daily 13)  Nexium 40 Mg Cpdr (Esomeprazole magnesium) .Marland Kitchen.. 1 two times a day pre meals ; hold ranitidine  Patient Instructions: 1)  Low fat & low salt diet as discussed. 2)  Check your Blood Pressure regularly. If it is above: 130/80 ON AVERAGE  you should make an appointment because of the aneurysm.   Orders Added: 1)  Est. Patient Level III [65784] 2)  Misc. Referral [Misc. Ref]

## 2010-05-24 NOTE — Letter (Signed)
Summary: Results Follow up Letter  [facility]  [address] [place], [place] [address]   Phone: [phone]  Fax: [phone]    09/21/2009 MRN: [id number]  Rebecca Knight [address] [place], [place]  [address]  Dear [name],  The following are the results of your recent test(s):  Test         Result    Pap Smear:        Normal _____  Not Normal _____ Comments: ______________________________________________________ Cholesterol: LDL(Bad cholesterol):         Your goal is less than:         HDL (Good cholesterol):       Your goal is more than: Comments:  ______________________________________________________ Mammogram:        Normal _____  Not Normal _____ Comments:  ___________________________________________________________________ Hemoccult:        Normal __X___  Not normal _______ Comments:    _____________________________________________________________________ Other Tests:    We routinely do not discuss normal results over the telephone.  If you desire a copy of the results, or you have any questions about this information we can discuss them at your next office visit.   Sincerely,

## 2010-05-24 NOTE — Assessment & Plan Note (Signed)
Summary: FLU SHOT/KB   Nurse Visit  CC: Flu shot./kb   Allergies: 1)  ! Pcn  Orders Added: 1)  Flu Vaccine 30yrs + MEDICARE PATIENTS [Q2039] 2)  Administration Flu vaccine - MCR [G0008]           Flu Vaccine Consent Questions     Do you have a history of severe allergic reactions to this vaccine? no    Any prior history of allergic reactions to egg and/or gelatin? no    Do you have a sensitivity to the preservative Thimersol? no    Do you have a past history of Guillan-Barre Syndrome? no    Do you currently have an acute febrile illness? no    Have you ever had a severe reaction to latex? no    Vaccine information given and explained to patient? yes    Are you currently pregnant? no    Lot Number:[id number]   Exp Date:10/20/2010   Site Given  Left Deltoid IMu

## 2010-08-20 ENCOUNTER — Other Ambulatory Visit: Payer: Self-pay

## 2010-08-20 MED ORDER — OMEPRAZOLE 20 MG PO CPDR: 20.0000 mg | DELAYED_RELEASE_CAPSULE | Freq: Every day | ORAL | Status: DC

## 2010-09-04 NOTE — Assessment & Plan Note (Signed)
OFFICE VISIT   Beamon, Lona G  DOB:  [birth year]                                       05/07/2010  [id number]#:[id number]   REASON FOR VISIT:  Follow up abdominal aneurysm.   HISTORY:  This is an 77 year old female I am seeing at the request of  Dr. Alwyn Ren for evaluation of abdominal aortic aneurysm.  The patient's  aneurysm was detected during ultrasound evaluation for abdominal pain.  The patient does not endorse any current abdominal pain or back pain.  The patient has a history of hypertension and hypercholesterolemia, both  of which medically managed.  She has never smoked.  She has a family  history which includes coronary artery disease.   REVIEW OF SYSTEMS:  GENERAL:  Positive for weight loss.  VASCULAR:  Negative.  CARDIAC:  Positive for chest pain and heart murmur.  GI:  Positive for occasional constipation.  NEURO:  Negative.  PULMONARY:  Negative.  HEME:  Negative.  GU:  Positive for frequent urination.  ENT:  Positive for recent change in eyesight and hearing.  MUSCULOSKELETAL:  Positive arthritis and joint pain.  PSYCH:  Positive for anxiety.  SKIN:  Negative for rash.   PAST MEDICAL HISTORY:  Hypertension, hypercholesterolemia, osteoporosis.   SOCIAL HISTORY:  She is widowed with 3 children.  Does not smoke or  drink.   FAMILY HISTORY:  Positive for coronary artery disease.   ALLERGIES:  Penicillin.   PHYSICAL EXAMINATION:  Heart rate 73, blood pressure 181/101, O2 sats  are 98%.  GENERAL:  She is well-appearing, in no distress.  HEENT:  Within normal limits.  LUNGS:  Clear bilaterally.  CARDIOVASCULAR:  Regular rate and rhythm.  She has a palpable left  dorsalis pedis pulse.  I cannot feel a right dorsalis pedis.  ABDOMEN:  Soft and nontender, pulsatile mass is present and nontender,  diastasis of lower abdomen.  MUSCULOSKELETAL:  Without major deformities.  NEURO:  she has no focal deficits.  SKIN:  Without rash.  She that she  does have 1+ edema bilaterally.   Diagnostic studies:  Ultrasound was performed today which shows a 4.3 x  3.8 cm abdominal aortic aneurysm with ectatic iliac arteries.   ASSESSMENT/PLAN:  Asymptomatic abdominal aortic aneurysm.   PLAN:  The patient will need to be followed with serial examinations to  monitor the size of her aneurysm.  I would like to get a CT angiogram in  6 months to confirm the ultrasound findings of an aneurysm greater 4 cm.  In addition, this will be to evaluate her infrarenal neck anatomy to see  if she would potentially be a candidate for endovascular repair if and  when she reaches the appropriate size of 5 cm or if she becomes  symptomatic.  I will plan on seeing her back in 6 months.     Jorge Ny, MD  Electronically Signed   VWB/MEDQ  D:  05/07/2010  T:  05/08/2010  Job:  [id number]

## 2010-09-06 ENCOUNTER — Telehealth: Payer: Self-pay | Admitting: Internal Medicine

## 2010-09-06 MED ORDER — GABAPENTIN 400 MG PO CAPS: 400.0000 mg | ORAL_CAPSULE | Freq: Two times a day (BID) | ORAL | Status: DC

## 2010-09-06 NOTE — Telephone Encounter (Signed)
Says she called [facility] [place] three days ago for Gabapentin and they say we have not contacted them--didn't see any refill listed in computer

## 2010-09-07 NOTE — Discharge Summary (Signed)
NAMECLEVIE, PROUT NO.:  [id number]   MEDICAL RECORD NO.:  [id number]          PATIENT TYPE:  INP   LOCATION:  3315                         FACILITY:  [facility]   PHYSICIAN:  Cherylynn Ridges, M.D.    DATE OF BIRTH:  07-May-1922   DATE OF ADMISSION:  07/20/2005  DATE OF DISCHARGE:  07/21/2005                                 DISCHARGE SUMMARY   ADMITTING TRAUMA SURGEON:  Jimmye Norman, M.D.   CONSULTANTS:  1.  Gloris Manchester. Lazarus Salines, M.D., ENT.  2.  Payton Doughty, M.D., neurosurgery.   DISCHARGE DIAGNOSES:  1.  Status post a fall on level ground.  2.  Traumatic brain injury with small subarachnoid hemorrhage.  3.  Right orbital blowout fracture with minimal displacement.   BRIEF HISTORY:  On admission, this is a very pleasant 77 year old white  female who was apparently chasing a ball when she accidentally stumbled and  fell directly onto the right side of her face.  There was no significant  loss of consciousness.  The patient presented to the emergency room for  evaluation.  Workup at this time, including a CT scan of the head, showed a  small amount of right frontal subarachnoid hemorrhage.  She also had a right  orbital floor and posterior fracture with minimal displacement.  Secondary  to this. we were asked to the patient for admission.   The patient was evaluated by Dr. Trey Sailors and felt that she could be  observed overnight.  A follow-up CT scan of the head showed a stable  appearance of her right frontal subarachnoid hemorrhage.  It was felt she  could be discharged home to the care of her family for continued  observation.  She was ambulatory and tolerating a regular diet by discharge.   The patient was also seen by Dr. Lazarus Salines, and it was felt that the patient  should avoid nose blowing x2 weeks.  She was given Keflex to prevent  infection from contaminated sinuses.  She was given Darvocet to use on a  p.r.n. basis  for pain.  She was also to follow up with Dr.  Elmer Picker, her regular  ophthalmologist, following discharge, as well.  It was not felt that she  would need specific fixation of these fractures.   The patient was therefore discharged in stable and improved condition on the  above medications on July 21, 2005.      Shawn Rayburn, P.A.      Cherylynn Ridges, M.D.  Electronically Signed    SR/MEDQ  D:  07/31/2005  T:  07/31/2005  Job:  [id number]

## 2010-09-07 NOTE — Consult Note (Signed)
NAMEALASHA, Rebecca Knight NO.:  [id number]   MEDICAL RECORD NO.:  [id number]          PATIENT TYPE:  INP   LOCATION:  3315                         FACILITY:  [facility]   PHYSICIAN:  Payton Doughty, M.D.      DATE OF BIRTH:  Jun 19, 1922   DATE OF CONSULTATION:  07/21/2005  DATE OF DISCHARGE:  07/21/2005                                   CONSULTATION   PLACE OF CONSULT:  3300.   Trauma service asked for it.   BODY OF TEXT:  This is a very nice 77 year old right-handed white lady who  took a spill last night and fell and hit her right orbit.  She did not have  loss of consciousness, but she does have a gash over her right eye and large  shiner.  She came to the [facility] emergency room, where she was seen by  Dr. Freida Busman and then was transferred over to [facility] and was admitted by the  trauma service.  I looked at her CT scan last night.  She had a small amount  of subarachnoid hemorrhagic, traumatic, right underneath her impact site.  As I visit with her this morning, she complains of some sore ribs and a bit  of a black eye but no neurologic complaints, and she is not nauseated.   She is very pleasant, awake, alert and oriented x3.  Her Pupils equal,  round, and reactive to light.  Her extraocular movements are intact in both  eyes.  Facial movement and sensation intact.  Tongue protrudes in the  midline, shoulder shrug is normal, and she describes no swallowing  difficulties.  Her motor strength is 5/5.  She does not have a pronator  drift, and she does not have any sensory deficit.  Reflexes are 1 in the  upper extremities, absent at the knees, and she has downgoing toes  bilaterally.   CT scanning done this morning shows no change in the small amount of  subarachnoid blood associated with this trauma.   CLINICAL IMPRESSION:  Mild closed head injury with an orbital fracture.  She  is stable from a neurosurgical standpoint, and she could be observed and  discharged at  trauma's discretion.  Follow-up will be on a p.r.n. basis.           ______________________________  Payton Doughty, M.D.     MWR/MEDQ  D:  07/21/2005  T:  07/23/2005  Job:  [id number]

## 2010-09-07 NOTE — Consult Note (Signed)
Rebecca Knight, DESCHEPPER NO.:  [id number]   MEDICAL RECORD NO.:  [id number]          PATIENT TYPE:  INP   LOCATION:  3315                         FACILITY:  [facility]   PHYSICIAN:  Zola Button T. Lazarus Salines, M.D. DATE OF BIRTH:  [birth year]   DATE OF CONSULTATION:  07/21/2005  DATE OF DISCHARGE:  07/21/2005                                   CONSULTATION   CHIEF COMPLAINT:  Facial trauma.   HISTORY:  An 76 year old white female was chasing a tennis ball across the  parking lot last evening when she stumbled and fell directly on her right  face.  She did not lose consciousness.  She had several scrapes contusions  of her right face, also left hand, and right chest wall.  She was brought to  the emergency room where evaluation revealed a blowout fracture of the right  orbit, mild subarachnoid hemorrhage, and some other contusions.  She had  some facial lacerations closed.  ENT was called in consultation for  evaluation of the blowout fracture.  She is not noticing any specific visual  difficulty including diplopia, blindness, or pain with range of motion.  She  is breathing comfortably through her nose with no further bleeding.  Hearing  is fine on both sides.  Her teeth feel fine and her jaws moved freely  without discomfort.   PAST MEDICAL HISTORY:  She is allergic to PENICILLIN.   PHYSICAL EXAMINATION:  This is a animated pleasant older middle-aged white  female who has excoriations and contusions around her right eye including a  laceration of the lateral brow.  She has slight difficulty opening her eyes  but has subjectively intact vision on each side, and subjectively and  objectively full range of motion with conjugate gaze and no subjective  diplopia.  I did not perform further examination.   X-RAYS:  Review of the maxillofacial CT scans including a coronal plus  sagittal reconstructions show a broad fracture of the right orbital floor  positioned more posteriorly and  laterally.  It is relatively broad in its  contours but minimally displaced.  No other bony facial fractures.   IMPRESSION:  Right orbital blowout fracture relatively broad with minimal  displacement and no evidence of entrapment.   PLAN:  I discussed this with her and her family.  I would like her to use  ice for 24 hours, elevation for 3-4 days, avoidance of nose blowing for two  weeks, and cephalexin to prevent infection from the contaminated sinuses.  We  will send her home with Darvocet for pain.  She needs to see Dr. Elmer Picker, her  ophthalmologist, in the next 1-2 days.  I will see her back in my office in  one week.  I do not think this is likely to require any specific repair.  She understands and agrees.      Rebecca Knight. Lazarus Salines, M.D.  Electronically Signed     KTW/MEDQ  D:  07/21/2005  T:  07/22/2005  Job:  [id number]   cc:   Delon Sacramento, M.D.  Fax: [phone]   Emilie Rutter.  Channing Mutters, M.D.  Fax: [phone]   Cherylynn Ridges, M.D.  [address]  [place]  [place] [address]

## 2010-09-13 ENCOUNTER — Other Ambulatory Visit: Payer: Self-pay | Admitting: Surgery

## 2010-09-13 DIAGNOSIS — I714 Abdominal aortic aneurysm, without rupture: Secondary | ICD-10-CM

## 2010-10-04 ENCOUNTER — Other Ambulatory Visit: Payer: Self-pay | Admitting: Internal Medicine

## 2010-10-04 MED ORDER — BENAZEPRIL HCL 40 MG PO TABS: 40.0000 mg | ORAL_TABLET | Freq: Every day | ORAL | Status: DC

## 2010-10-04 NOTE — Telephone Encounter (Signed)
RX sent to pharmacy  

## 2010-10-08 ENCOUNTER — Other Ambulatory Visit: Payer: Self-pay | Admitting: Internal Medicine

## 2010-11-12 ENCOUNTER — Ambulatory Visit
Admission: RE | Admit: 2010-11-12 | Discharge: 2010-11-12 | Disposition: A | Discharge status: Home / Self Care | Payer: Medicare Other | Source: Ambulatory Visit | Attending: Surgery | Admitting: Surgery

## 2010-11-12 ENCOUNTER — Ambulatory Visit (INDEPENDENT_AMBULATORY_CARE_PROVIDER_SITE_OTHER): Payer: No Typology Code available for payment source | Admitting: Surgery

## 2010-11-12 DIAGNOSIS — I714 Abdominal aortic aneurysm, without rupture: Secondary | ICD-10-CM

## 2010-11-12 MED ORDER — IOHEXOL 350 MG/ML SOLN
100.0000 mL | Freq: Once | INTRAVENOUS | Status: AC | PRN
  Administered 2010-11-12: 100 mL via INTRAVENOUS

## 2010-11-13 NOTE — Assessment & Plan Note (Signed)
OFFICE VISIT  Knight, Rebecca G DOB:  [birth year]                                       11/12/2010 ZOXWR#:[id number]  REASON FOR VISIT:  Follow up aneurysm.  HISTORY:  This is a very pleasant 77 year old female that I am following at the request of Dr. Alwyn Ren for an abdominal aortic aneurysm that was detected during ultrasound evaluation for abdominal pain.  The patient has been asymptomatic.  Since I last saw her she has not had any abdominal pain.  She does complain of occasional twinge in her chest but it is very intermittent and not reproducible.  She continues to be managed for hypertension, hypercholesterolemia.  She has never smoked. She does complain of some leg swelling.  PHYSICAL EXAMINATION:  Vital signs:  Heart rate 87, blood pressure 176/96, respiratory rate 24.  General:  She is well-appearing, in no distress.  Respirations:  Nonlabored.  Cardiovascular:  Regular rate and rhythm.  Abdomen:  Soft and nontender.  Extremities:  Reveal 2+ pitting edema bilaterally without ulceration.  She does have right posterior leg varicosity.  DIAGNOSTIC STUDIES:  CT angiogram was performed today which reveals prominent descending aorta, maximum diameter was 3.6 cm.  The infrarenal abdominal aortic aneurysm is trilobed, maximum diameter is 4.2 by radiology measurements, 4.6 by my measurements.  ASSESSMENT AND PLAN: 1. Abdominal aortic aneurysm.  I discussed the size criteria for     repair with the patient today.  I still feel like she falls below     the indications for proceeding with repair; this does not mean she     is not at risk for rupture but I think her risk of rupture is very     low at this time.  She was told to go to the emergency department     should she develop severe abdominal or low back pain.  I will plan     on seeing her back in 6 months with a repeat ultrasound. 2. Lower extremity swelling with varicosities:  I have encouraged the  patient to keep her legs elevated when she is not on her feet.     Also we discussed compression stockings.  When she comes back to     see me in 6 months I will get a venous reflux evaluation to see if     we may have options for her to help with the swelling.    Jorge Ny, MD Electronically Signed  VWB/MEDQ  D:  11/12/2010  T:  11/13/2010  Job:  [id number]  cc:   Titus Dubin. Alwyn Ren, MD,FACP,FCCP

## 2011-01-10 ENCOUNTER — Other Ambulatory Visit: Payer: Self-pay | Admitting: Internal Medicine

## 2011-01-10 MED ORDER — PRAVASTATIN SODIUM 20 MG PO TABS: 20.0000 mg | ORAL_TABLET | Freq: Every day | ORAL | Status: DC

## 2011-01-10 NOTE — Telephone Encounter (Signed)
RX sent

## 2011-02-06 ENCOUNTER — Other Ambulatory Visit: Payer: Self-pay | Admitting: Internal Medicine

## 2011-03-08 ENCOUNTER — Other Ambulatory Visit: Payer: Self-pay | Admitting: Internal Medicine

## 2011-03-08 NOTE — Telephone Encounter (Signed)
Lipid/Hep 272.4/995.20  

## 2011-03-11 ENCOUNTER — Other Ambulatory Visit: Payer: Self-pay | Admitting: Internal Medicine

## 2011-03-11 MED ORDER — GABAPENTIN 400 MG PO CAPS: 400.0000 mg | ORAL_CAPSULE | Freq: Two times a day (BID) | ORAL | Status: DC

## 2011-03-11 NOTE — Telephone Encounter (Signed)
Patient will need to schedule a CPX  

## 2011-03-29 ENCOUNTER — Other Ambulatory Visit: Payer: Self-pay | Admitting: Internal Medicine

## 2011-03-29 MED ORDER — BENAZEPRIL HCL 40 MG PO TABS: 40.0000 mg | ORAL_TABLET | Freq: Every day | ORAL | Status: DC

## 2011-03-29 NOTE — Telephone Encounter (Signed)
Patient needs to schedule a CPX  

## 2011-04-10 ENCOUNTER — Other Ambulatory Visit: Payer: Self-pay | Admitting: Internal Medicine

## 2011-04-10 NOTE — Telephone Encounter (Signed)
Patient needs to schedule a CPX with fasting labs  

## 2011-05-15 ENCOUNTER — Encounter: Payer: Self-pay | Admitting: Surgery

## 2011-05-20 ENCOUNTER — Ambulatory Visit: Payer: Medicare Other | Admitting: Surgery

## 2011-06-07 ENCOUNTER — Other Ambulatory Visit: Payer: Self-pay | Admitting: Internal Medicine

## 2011-06-11 MED ORDER — GABAPENTIN 400 MG PO CAPS: ORAL_CAPSULE | ORAL | Status: DC

## 2011-06-11 NOTE — Telephone Encounter (Signed)
Refill x 3 months 

## 2011-06-11 NOTE — Telephone Encounter (Signed)
Please verify ok to fill x 3 months, Last seen 2011

## 2011-06-11 NOTE — Telephone Encounter (Signed)
Last seen 04/13/2010, no pending appointment. Last filled 02/2011 with notation OV due  Dr.Hopper please advise

## 2011-06-11 NOTE — Telephone Encounter (Signed)
Per Dr.Hopper ok to fill  

## 2011-06-11 NOTE — Telephone Encounter (Signed)
Addended by: Maurice Small on: 06/11/2011 05:22 PM   Modules accepted: Orders

## 2011-06-21 ENCOUNTER — Encounter: Payer: Self-pay | Admitting: Surgery

## 2011-06-24 ENCOUNTER — Encounter: Payer: Self-pay | Admitting: Surgery

## 2011-06-24 ENCOUNTER — Ambulatory Visit (INDEPENDENT_AMBULATORY_CARE_PROVIDER_SITE_OTHER): Payer: Medicare Other | Admitting: Vascular Surgery

## 2011-06-24 ENCOUNTER — Ambulatory Visit (INDEPENDENT_AMBULATORY_CARE_PROVIDER_SITE_OTHER): Payer: Medicare Other | Admitting: Surgery

## 2011-06-24 ENCOUNTER — Encounter (INDEPENDENT_AMBULATORY_CARE_PROVIDER_SITE_OTHER): Payer: Medicare Other | Admitting: Vascular Surgery

## 2011-06-24 VITALS — BP 176/130 | HR 79 | Resp 16 | Ht 60.0 in | Wt 122.0 lb

## 2011-06-24 DIAGNOSIS — I714 Abdominal aortic aneurysm, without rupture, unspecified: Secondary | ICD-10-CM

## 2011-06-24 DIAGNOSIS — Z01818 Encounter for other preprocedural examination: Secondary | ICD-10-CM

## 2011-06-24 DIAGNOSIS — R609 Edema, unspecified: Secondary | ICD-10-CM

## 2011-06-24 DIAGNOSIS — I83893 Varicose veins of bilateral lower extremities with other complications: Secondary | ICD-10-CM

## 2011-06-24 NOTE — Progress Notes (Signed)
Vascular and Vein Specialist of [facility]   Patient name: Rebecca Knight MRN: [id number] DOB: 1923/03/24 Sex: female     Chief Complaint  Patient presents with  . AAA    6 month f/up w/Labs    HISTORY OF PRESENT ILLNESS: The patient comes back today for followup of her abdominal aortic aneurysm. She easily had a CT scan done 6 months ago which revealed an infrarenal 4.6 cm aneurysm. She is back today for ultrasound. She denies having any abdominal pain. She does report right upper quadrant pain with dark stools. Associate been complaining of bilateral lower extremity swelling since she also had a venous ultrasound today.  Past Medical History  Diagnosis Date  . Hypertension   . Hyperlipidemia   . Heart murmur   . Arthritis   . AAA (abdominal aortic aneurysm)   . Varicose veins   . Anxiety   . Osteoporosis   . Cancer 1995    colon cancer  . Herpes zoster of eye 2002    Dr. Elmer Picker  . Post herpetic neuralgia   . Diverticulosis of colon   . Calculus of gallbladder without mention of cholecystitis or obstruction     History reviewed. No pertinent past surgical history.  History   Social History  . Marital Status: Married    Spouse Name: N/A    Number of Children: N/A  . Years of Education: N/A   Occupational History  . Not on file.   Social History Main Topics  . Smoking status: Never Smoker   . Smokeless tobacco: Not on file  . Alcohol Use: No  . Drug Use: No  . Sexually Active:    Other Topics Concern  . Not on file   Social History Narrative  . No narrative on file    Family History  Problem Relation Age of Onset  . Heart disease Other   . Heart disease Father   . Hypertension Father   . Heart attack Father   . Diabetes Sister   . Heart attack Sister   . Diabetes Brother   . Heart disease Brother   . Heart attack Brother     Allergies as of 06/24/2011 - Review Complete 06/24/2011  Allergen Reaction Noted  . Penicillins  11/20/2006    Current  Outpatient Prescriptions on File Prior to Visit  Medication Sig Dispense Refill  . benazepril (LOTENSIN) 40 MG tablet Take 1 tablet (40 mg total) by mouth daily.  90 tablet  0  . calcium-vitamin D (OSCAL) 250-125 MG-UNIT per tablet Take 1 tablet by mouth daily.      . Cholecalciferol (VITAMIN D-3 PO) Take by mouth daily.      . fish oil-omega-3 fatty acids 1000 MG capsule Take 1 g by mouth daily.      Marland Kitchen gabapentin (NEURONTIN) 400 MG capsule TAKE ONE CAPSULE BY MOUTH TWICE A DAY  180 capsule  0  . ketotifen (ALAWAY) 0.025 % ophthalmic solution 1 drop 2 (two) times daily.      . Multiple Vitamin (MULTIVITAMIN) tablet Take 1 tablet by mouth daily.      . pravastatin (PRAVACHOL) 20 MG tablet TAKE 1 TABLET EVERY DAY  90 tablet  0  . naproxen sodium (ANAPROX) 220 MG tablet Take 220 mg by mouth as needed.      Marland Kitchen omeprazole (PRILOSEC) 20 MG capsule Take 1 capsule (20 mg total) by mouth daily.  30 capsule  7  . Probiotic Product (PROBIOTIC FORMULA) CAPS Take by mouth.  REVIEW OF SYSTEMS: No changes  PHYSICAL EXAMINATION:   Vital signs are BP 176/130  Pulse 79  Resp 16  Ht 5' (1.524 m)  Wt 122 lb (55.339 kg)  BMI 23.83 kg/m2  SpO2 100% General: The patient appears their stated age. HEENT:  No gross abnormalities Pulmonary:  Non labored breathing Abdomen: Soft and non-tender Musculoskeletal: There are no major deformities. Neurologic: No focal weakness or paresthesias are detected, Skin: There are no ulcer or rashes noted. Psychiatric: The patient has normal affect. Cardiovascular: There is a regular rate and rhythm without significant murmur appreciated. Palpable right pedal pulse nonpalpable left, bilateral edema. No carotid bruits   Diagnostic Studies Ultrasound today underestimated the degree of change in her aneurysm. Maximum diameter was 3.35. There is a, and that this may be underestimated due to tortuosity.  Venous ultrasound reveals right saphenous reflux with a small  caliber vein. She also has bilateral deep reflux in the right popliteal vein and the left superficial femoral vein.  Assessment: Abdominal aortic aneurysm Leg swelling Plan: Aneurysm:  I cannot make recommendations for management of her aneurysm based on her ultrasound today. She's going to be referred for CT angiogram of the abdomen and pelvis she will come back in one to 2 weeks once his study has been done Leg swelling: Based on her ultrasound today do not she is a candidate for intervention. I recommended compression stockings, 20-30 mm gradient. Prior visit She will get carotid Dopplers as well as ABIs anticipating her operation.  Jorge Ny, M.D. Vascular and Vein Specialists of [place] Office: [phone] Pager:  [phone]

## 2011-06-28 ENCOUNTER — Other Ambulatory Visit: Payer: Self-pay | Admitting: Surgery

## 2011-06-28 ENCOUNTER — Encounter: Payer: Self-pay | Admitting: Surgery

## 2011-07-01 ENCOUNTER — Ambulatory Visit
Admission: RE | Admit: 2011-07-01 | Discharge: 2011-07-01 | Disposition: A | Discharge status: Home / Self Care | Payer: Medicare Other | Source: Ambulatory Visit | Attending: Surgery | Admitting: Surgery

## 2011-07-01 ENCOUNTER — Encounter (INDEPENDENT_AMBULATORY_CARE_PROVIDER_SITE_OTHER): Payer: Medicare Other | Admitting: Vascular Surgery

## 2011-07-01 ENCOUNTER — Ambulatory Visit (INDEPENDENT_AMBULATORY_CARE_PROVIDER_SITE_OTHER): Payer: Medicare Other | Admitting: Surgery

## 2011-07-01 ENCOUNTER — Encounter: Payer: Self-pay | Admitting: Surgery

## 2011-07-01 ENCOUNTER — Other Ambulatory Visit (INDEPENDENT_AMBULATORY_CARE_PROVIDER_SITE_OTHER): Payer: Medicare Other | Admitting: Vascular Surgery

## 2011-07-01 VITALS — BP 171/83 | HR 74 | Resp 16 | Ht 60.0 in | Wt 123.0 lb

## 2011-07-01 DIAGNOSIS — I714 Abdominal aortic aneurysm, without rupture: Secondary | ICD-10-CM

## 2011-07-01 DIAGNOSIS — Z01810 Encounter for preprocedural cardiovascular examination: Secondary | ICD-10-CM

## 2011-07-01 DIAGNOSIS — Z01818 Encounter for other preprocedural examination: Secondary | ICD-10-CM

## 2011-07-01 MED ORDER — IOHEXOL 350 MG/ML SOLN
100.0000 mL | Freq: Once | INTRAVENOUS | Status: AC | PRN
  Administered 2011-07-01: 100 mL via INTRAVENOUS

## 2011-07-01 NOTE — Progress Notes (Signed)
Vascular and Vein Specialist of [facility]   Patient name: Rebecca Knight MRN: [id number] DOB: 10/20/1922 Sex: female     Chief Complaint  Patient presents with  . AAA    CTA  Abd/Pelvis - Carotid  U/S and ABI's prior    HISTORY OF PRESENT ILLNESS: The patient comes back today for further evaluation of her abdominal aortic aneurysm. She had a CT scan prior to my visit. She is without abdominal or back pain today.  Past Medical History  Diagnosis Date  . Hypertension   . Hyperlipidemia   . Heart murmur   . Arthritis   . AAA (abdominal aortic aneurysm)   . Varicose veins   . Anxiety   . Osteoporosis   . Cancer 1995    colon cancer  . Herpes zoster of eye 2002    Dr. Elmer Picker  . Post herpetic neuralgia   . Diverticulosis of colon   . Calculus of gallbladder without mention of cholecystitis or obstruction     History reviewed. No pertinent past surgical history.  History   Social History  . Marital Status: Married    Spouse Name: N/A    Number of Children: N/A  . Years of Education: N/A   Occupational History  . Not on file.   Social History Main Topics  . Smoking status: Never Smoker   . Smokeless tobacco: Not on file  . Alcohol Use: No  . Drug Use: No  . Sexually Active:    Other Topics Concern  . Not on file   Social History Narrative  . No narrative on file    Family History  Problem Relation Age of Onset  . Heart disease Other   . Heart disease Father   . Hypertension Father   . Heart attack Father   . Diabetes Sister   . Heart attack Sister   . Diabetes Brother   . Heart disease Brother   . Heart attack Brother     Allergies as of 07/01/2011 - Review Complete 07/01/2011  Allergen Reaction Noted  . Penicillins  11/20/2006    Current Outpatient Prescriptions on File Prior to Visit  Medication Sig Dispense Refill  . benazepril (LOTENSIN) 40 MG tablet Take 1 tablet (40 mg total) by mouth daily.  90 tablet  0  . calcium-vitamin D (OSCAL)  250-125 MG-UNIT per tablet Take 1 tablet by mouth daily.      . Cholecalciferol (VITAMIN D-3 PO) Take by mouth daily.      . fish oil-omega-3 fatty acids 1000 MG capsule Take 1 g by mouth daily.      Marland Kitchen gabapentin (NEURONTIN) 400 MG capsule TAKE ONE CAPSULE BY MOUTH TWICE A DAY  180 capsule  0  . Multiple Vitamin (MULTIVITAMIN) tablet Take 1 tablet by mouth daily.      . pravastatin (PRAVACHOL) 20 MG tablet TAKE 1 TABLET EVERY DAY  90 tablet  0  . ketotifen (ALAWAY) 0.025 % ophthalmic solution 1 drop 2 (two) times daily.      . naproxen sodium (ANAPROX) 220 MG tablet Take 220 mg by mouth as needed.      Marland Kitchen omeprazole (PRILOSEC) 20 MG capsule Take 1 capsule (20 mg total) by mouth daily.  30 capsule  7  . Probiotic Product (PROBIOTIC FORMULA) CAPS Take by mouth.       No current facility-administered medications on file prior to visit.     REVIEW OF SYSTEMS: No changes from prior visit  PHYSICAL EXAMINATION:  Vital signs are BP 171/83  Pulse 74  Resp 16  Ht 5' (1.524 m)  Wt 123 lb (55.792 kg)  BMI 24.02 kg/m2  SpO2 98% General: The patient appears their stated age. HEENT:  No gross abnormalities Pulmonary:  Non labored breathing Abdomen: Soft and non-tender Musculoskeletal: There are no major deformities. Neurologic: No focal weakness or paresthesias are detected, Skin: There are no ulcer or rashes noted. Psychiatric: The patient has normal affect. Cardiovascular: I cannot palpate pedal pulses   Diagnostic Studies Carotid duplex: 1-39% stenosis bilaterally ABI: 0.98 on the right 1.0 on the left CT angiogram: Increase in the size of the infrarenal component to her aneurysm from 4.2-4.9 mm. This is over a 7 month.  Assessment: Abdominal aortic aneurysm Plan: Based on the rate of growth (7 mm in 7 months) I feel that we need to address her aneurysm. I've recommended proceeding with endovascular repair. I think this can be done percutaneously. I discussed the risks and benefits  of the procedure with the patient including the risk of death the risk of cardiopulmonary complications risk of renal complications, the risk of intestinal and lower extremity complications. The patient takes care of at daughter at home and needs to make arrangements for this prior operation. We have tentatively scheduled her for Thursday, April 18. I'm sending her for a Myoview.  I spent in excess of 45 minutes discussing the CT scan findings with the patient, reviewing her information and planning her operation  V. Charlena Cross, M.D. Vascular and Vein Specialists of [place] Office: [phone] Pager:  [phone]

## 2011-07-04 ENCOUNTER — Ambulatory Visit (INDEPENDENT_AMBULATORY_CARE_PROVIDER_SITE_OTHER): Payer: Medicare Other | Admitting: Internal Medicine

## 2011-07-04 ENCOUNTER — Encounter: Payer: Self-pay | Admitting: Internal Medicine

## 2011-07-04 VITALS — BP 140/92 | HR 73 | Temp 98.3°F | Wt 123.0 lb

## 2011-07-04 DIAGNOSIS — I10 Essential (primary) hypertension: Secondary | ICD-10-CM

## 2011-07-04 DIAGNOSIS — E782 Mixed hyperlipidemia: Secondary | ICD-10-CM

## 2011-07-04 DIAGNOSIS — I714 Abdominal aortic aneurysm, without rupture: Secondary | ICD-10-CM

## 2011-07-04 MED ORDER — BENAZEPRIL HCL 40 MG PO TABS: 40.0000 mg | ORAL_TABLET | Freq: Every day | ORAL | Status: DC

## 2011-07-04 MED ORDER — METOPROLOL TARTRATE 25 MG PO TABS: 25.0000 mg | ORAL_TABLET | Freq: Two times a day (BID) | ORAL | Status: DC

## 2011-07-04 MED ORDER — PRAVASTATIN SODIUM 20 MG PO TABS: 20.0000 mg | ORAL_TABLET | Freq: Every day | ORAL | Status: DC

## 2011-07-04 NOTE — Assessment & Plan Note (Signed)
Blood pressure control is suboptimal in view of the aneurysm. Low-dose metoprolol will be added

## 2011-07-04 NOTE — Assessment & Plan Note (Signed)
The operation was discussed; I've encouraged her to pursue this because of the increased risk with her hypertension and the physical strain  related to providing care for her daughter. I've also asked her to talk to her daughter's primary care doctor to see if  Aid  can be obtained through [employer] sources.

## 2011-07-04 NOTE — Patient Instructions (Signed)
Blood Pressure Goal  Ideally is an AVERAGE < 130/80. This AVERAGE should be calculated from @ least 5-7 BP readings taken @ different times of day on different days of week. You should not respond to isolated BP readings , but rather the AVERAGE for that week .  Please do not take the metoprolol in am prior to the scheduled stress test.

## 2011-07-04 NOTE — Progress Notes (Signed)
  Subjective:    Patient ID: Rebecca Knight, female    DOB: 04/15/1923, 78 y.o.   MRN: [id number]  HPI  abd aneurysm - MD told patient if aneurysm "gets bigger it is liable to burst".Stent placement is scheduled for april 18th, pt here today to talk about pros and cons of stent placement.  Pt is a non-smoker, she does have HTN - takes medications, pt has never had a MI  Pt had an "abd attack" with pain a few months ago - ultrasound showed aneurysm and gallstones  Pt then had another "attack" a few weeks ago - stools were black with what looked like seeds.  No abd pain now, no changes in stool now, sometimes pt has back pain, pt lifts daughter (125pounds) - son is helping out now; has helper X1 per week for bath.  Pt has had no pre syncope, does have incoordination at times, but has not fallen, no numbness or tinging in feet or hands, does have swelling in ankles off/on.  does not follow a certain diet  Review of Systems   She denies any clay-colored stool or coke colored urine. The dark stool has resolved    Objective:   Physical Exam Gen.: thin but healthy and well-nourished in appearance. Alert, appropriate and cooperative throughout exam.Appears younger than stated age   Eyes: No corneal or conjunctival inflammation noted. No icterus. Mouth: Oral mucosa and oropharynx reveal no lesions or exudates. Teeth in good repair. Neck: No deformities, masses, or tenderness noted.  Lungs: Normal respiratory effort; chest expands symmetrically. Lungs are clear to auscultation without rales, wheezes, or increased work of breathing. Heart: Normal rate and rhythm. Normal S1 and S2. No gallop, click, or rub. Grade 1/6 systolic murmur . Abdomen: Bowel sounds normal; abdomen soft and nontender. No masses, organomegaly or hernias noted.AAA palpable  Musculoskeletal/extremities:  No clubbing, cyanosis,pitting edema, or deformity noted.  Nail health  good. Vascular: Carotid, radial artery, dorsalis pedis  and  posterior tibial pulses are full and equal. No bruits present. Neurologic: Alert and oriented x3.          Skin: Intact without suspicious lesions or rashes. Lymph: No cervical, axillary lymphadenopathy present. Psych: Mood and affect are normal. Normally interactive                                                                                         Assessment & Plan:

## 2011-07-05 ENCOUNTER — Other Ambulatory Visit: Payer: Self-pay | Admitting: *Deleted

## 2011-07-05 LAB — HEPATIC FUNCTION PANEL
AST: 22 U/L (ref 0–37)
Albumin: 4 g/dL (ref 3.5–5.2)
Alkaline Phosphatase: 94 U/L (ref 39–117)
Total Protein: 7 g/dL (ref 6.0–8.3)

## 2011-07-05 LAB — LIPID PANEL
Cholesterol: 144 mg/dL (ref 0–200)
LDL Cholesterol: 65 mg/dL (ref 0–99)
Triglycerides: 99 mg/dL (ref 0.0–149.0)

## 2011-07-08 NOTE — Procedures (Unsigned)
CAROTID DUPLEX EXAM  INDICATION:  Preoperative exam  HISTORY: Diabetes:  No Cardiac:  No Hypertension:  Yes Smoking:  No Previous Surgery:  No carotid intervention CV History:  Asymptomatic Amaurosis Fugax No, Paresthesias No, Hemiparesis No                                      RIGHT             LEFT Brachial systolic pressure:         176               178 Brachial Doppler waveforms:         WNL               WNL Vertebral direction of flow:        Antegrade         Antegrade DUPLEX VELOCITIES (cm/sec) CCA peak systolic                   65                74 ECA peak systolic                   63                57 ICA peak systolic                   70                68-P/123-M ICA end diastolic                   23                18-P/41-M PLAQUE MORPHOLOGY:                  Heterogeneous     Heterogeneous PLAQUE AMOUNT:                      Mild              Mild PLAQUE LOCATION:                    CCA/ICA           CCA/ICA  IMPRESSION: 1. Bilateral internal carotid artery stenosis present in the 1%-39%     range. 2. Bilateral external carotid arteries are patent. 3. Bilateral vertebral arteries are patent and antegrade.  ___________________________________________ V. Charlena Cross, MD  SH/MEDQ  D:  07/01/2011  T:  07/01/2011  Job:  [id number]

## 2011-07-08 NOTE — Procedures (Unsigned)
LOWER EXTREMITY VENOUS REFLUX EXAM  INDICATION:  Bilateral lower extremity edema and varicose veins.  EXAM:  Using color-flow imaging and pulse Doppler spectral analysis, the bilateral common femoral, femoral, popliteal, posterior tibial, great and small saphenous veins were evaluated.  There is evidence suggesting deep venous insufficiency in the bilateral lower extremities.  The bilateral saphenofemoral junctions are competent.  The right GSV is not competent with reflux of > 500 milliseconds with caliber as described below.  The left GSV is competent.  The bilateral proximal small saphenous veins demonstrate incompetency, with the right diameters ranging from 0.11 cm to 0.03 cm and the left diameters ranging from 0.13 cm to 0.23 cm.  Additionally, multiple branches are noted arising off the mid calf segments of the bilateral small saphenous veins.  GSV Diameter (used if found to be incompetent only)                                           Right    Left Proximal Greater Saphenous Vein           0.40 cm  cm Proximal-to-mid-thigh                     0.33 cm  cm Mid thigh                                 0.05 cm  cm Mid-distal thigh                          cm       cm Distal thigh                              0.36 cm  cm Knee                                      0.24 cm  cm  IMPRESSION: 1. The right great saphenous vein is not competent with reflux of >     500 milliseconds. 2. The left great saphenous vein is competent. 3. The bilateral great saphenous veins are not tortuous. 4. The deep venous system bilaterally is not competent with reflux of     > 500 milliseconds. 5. The bilateral small saphenous veins are not competent with reflux     of > 500 milliseconds.     ___________________________________________ V. Charlena Cross, MD  SH/MEDQ  D:  06/24/2011  T:  06/24/2011  Job:  [id number]

## 2011-07-08 NOTE — Procedures (Unsigned)
DUPLEX ULTRASOUND OF ABDOMINAL AORTA  INDICATION:  Abdominal aortic aneurysm  HISTORY: Diabetes:  No Cardiac:  No Hypertension:  Yes Smoking:  No Connective Tissue Disorder: Family History:  No Previous Surgery:  No  DUPLEX EXAM:         AP (cm)                   TRANSVERSE (cm) Proximal             2.78 cm                   2.75 cm Mid                  2.67 cm                   2.76 cm Distal               3.85 cm                   3.35 cm Right Iliac          2.71 cm                   2.60 cm Left Iliac           3.38 cm                   3.08 cm  PREVIOUS:  Date:  05/07/2010  AP:  3.8  TRANSVERSE:  4.3  IMPRESSION: 1. Infrarenal abdominal aortic aneurysm measuring 3.85 cm x 3.35 cm,     this may be over or under estimated due to vessel tortuosity. 2. Right common iliac artery aneurysm present measuring 2.71 cm x 2.60     cm. 3. Left common iliac artery distal aneurysm present measuring 3.38 cm     x 3.08 cm. 4. Technically difficult due to vessel tortuosity present. 5. Increase in iliac artery diameters and stable abdominal aortic     aneurysm diameter since previous study on 05/07/2010.     ___________________________________________ V. Charlena Cross, MD  SH/MEDQ  D:  06/24/2011  T:  06/24/2011  Job:  [id number]

## 2011-07-24 ENCOUNTER — Encounter (HOSPITAL_COMMUNITY): Payer: Self-pay | Admitting: Pharmacy Technician

## 2011-08-01 ENCOUNTER — Ambulatory Visit (HOSPITAL_COMMUNITY)
Admission: RE | Admit: 2011-08-01 | Discharge: 2011-08-01 | Disposition: A | Discharge status: Home / Self Care | Payer: Medicare Other | Source: Ambulatory Visit | Attending: Anesthesiology | Admitting: Anesthesiology

## 2011-08-01 ENCOUNTER — Encounter (HOSPITAL_COMMUNITY)
Admission: RE | Admit: 2011-08-01 | Discharge: 2011-08-01 | Disposition: A | Discharge status: Home / Self Care | Payer: Medicare Other | Source: Ambulatory Visit | Attending: Surgery | Admitting: Surgery

## 2011-08-01 ENCOUNTER — Encounter (HOSPITAL_COMMUNITY): Payer: Self-pay

## 2011-08-01 DIAGNOSIS — Z01811 Encounter for preprocedural respiratory examination: Secondary | ICD-10-CM | POA: Insufficient documentation

## 2011-08-01 DIAGNOSIS — Z01818 Encounter for other preprocedural examination: Secondary | ICD-10-CM | POA: Insufficient documentation

## 2011-08-01 DIAGNOSIS — Z01810 Encounter for preprocedural cardiovascular examination: Secondary | ICD-10-CM | POA: Insufficient documentation

## 2011-08-01 DIAGNOSIS — Z01812 Encounter for preprocedural laboratory examination: Secondary | ICD-10-CM | POA: Insufficient documentation

## 2011-08-01 DIAGNOSIS — I700 Atherosclerosis of aorta: Secondary | ICD-10-CM | POA: Insufficient documentation

## 2011-08-01 DIAGNOSIS — M4120 Other idiopathic scoliosis, site unspecified: Secondary | ICD-10-CM | POA: Insufficient documentation

## 2011-08-01 LAB — DIFFERENTIAL
Basophils Absolute: 0.1 10*3/uL (ref 0.0–0.1)
Lymphocytes Relative: 28 % (ref 12–46)
Neutro Abs: 3.7 10*3/uL (ref 1.7–7.7)

## 2011-08-01 LAB — CBC
Hemoglobin: 13.1 g/dL (ref 12.0–15.0)
MCH: 28.1 pg (ref 26.0–34.0)
MCV: 85.9 fL (ref 78.0–100.0)
RBC: 4.67 MIL/uL (ref 3.87–5.11)

## 2011-08-01 LAB — URINALYSIS, ROUTINE W REFLEX MICROSCOPIC
Glucose, UA: NEGATIVE mg/dL
Hgb urine dipstick: NEGATIVE
Nitrite: NEGATIVE
Urobilinogen, UA: 0.2 mg/dL (ref 0.0–1.0)

## 2011-08-01 LAB — TYPE AND SCREEN: ABO/RH(D): O POS

## 2011-08-01 LAB — BLOOD GAS, ARTERIAL
Acid-Base Excess: 6.7 mmol/L — ABNORMAL HIGH (ref 0.0–2.0)
Bicarbonate: 30.8 mEq/L — ABNORMAL HIGH (ref 20.0–24.0)
Drawn by: 344381
O2 Saturation: 94 %
pO2, Arterial: 65.1 mmHg — ABNORMAL LOW (ref 80.0–100.0)

## 2011-08-01 LAB — COMPREHENSIVE METABOLIC PANEL
BUN: 9 mg/dL (ref 6–23)
CO2: 31 mEq/L (ref 19–32)
Calcium: 9.7 mg/dL (ref 8.4–10.5)
Creatinine, Ser: 0.78 mg/dL (ref 0.50–1.10)
GFR calc Af Amer: 85 mL/min — ABNORMAL LOW (ref >90)
GFR calc non Af Amer: 73 mL/min — ABNORMAL LOW (ref >90)
Glucose, Bld: 75 mg/dL (ref 70–99)

## 2011-08-01 LAB — APTT: aPTT: 35 seconds (ref 24–37)

## 2011-08-01 LAB — ABO/RH: ABO/RH(D): O POS

## 2011-08-01 LAB — PROTIME-INR
INR: 1.01 (ref 0.00–1.49)
Prothrombin Time: 13.5 seconds (ref 11.6–15.2)

## 2011-08-01 MED ORDER — SODIUM CHLORIDE 0.9 % IV SOLN
INTRAVENOUS | Status: DC
  Filled 2011-08-01: qty 1000

## 2011-08-01 NOTE — Pre-Procedure Instructions (Signed)
20 Rebecca Knight  08/01/2011   Your procedure is scheduled on:  08/08/2011  Report to [facility] at 0530 AM.  Call this number if you have problems the morning of surgery: [phone]   Remember:   Do not eat food:After Midnight.  May have clear liquids: up to 4 Hours before arrival.  DO NOT DRINK LIQUIDS AFTER 0130 AM  Clear liquids include soda, tea, black coffee, apple or grape juice, broth.  Take these medicines the morning of surgery with A SIP OF WATER: METOPROLOL     Do not wear jewelry, make-up or nail polish.  Do not wear lotions, powders, or perfumes. You may wear deodorant.  Do not shave 48 hours prior to surgery.  Do not bring valuables to the hospital.  Contacts, dentures or bridgework may not be worn into surgery.  Leave suitcase in the car. After surgery it may be brought to your room.  For patients admitted to the hospital, checkout time is 11:00 AM the day of discharge.   Patients discharged the day of surgery will not be allowed to drive home.  Name and phone number of your driver: [phone]  Special Instructions: CHG Shower Use Special Wash: 1/2 bottle night before surgery and 1/2 bottle morning of surgery.   Please read over the following fact sheets that you were given: Pain Booklet, Coughing and Deep Breathing, Blood Transfusion Information, MRSA Information and Surgical Site Infection Prevention

## 2011-08-02 NOTE — Consult Note (Signed)
Anesthesia:  Patient is a 78 year old female scheduled for a EVAR AAA on 08/08/11.  History includes non-smoker, HTN, murmur, anxiety, HLD, arthritis, osteoporosis, colon cancer s/p resection '95.  Her PCP is Dr. Marga Melnick whom she recently saw to further discuss her planned procedure.  Pre-operatively Dr. Myra Gianotti ordered a stress test which was performed at [facility] on 07/05/11 and showed normal myocardial perfusion imaging with no significant ischemia, EF 68%.  EKG on 08/01/11 showed SR with first degree AVB, LAD.  CXR on 08/01/11 showed no active disease. Thoracic dextroscoliosis. Mild generative changes thoracic spine. Tortuous thoracic aorta with atherosclerotic calcifications.   Labs and ABG noted.  I alerted Oswaldo Done, RN at [facility] to have Dr. Myra Gianotti review if not done so already.  However, I anticipate she can proceed if no acute change in her status.

## 2011-08-07 MED ORDER — VANCOMYCIN HCL IN DEXTROSE 1-5 GM/200ML-% IV SOLN
1000.0000 mg | INTRAVENOUS | Status: AC
  Administered 2011-08-08: 1000 mg via INTRAVENOUS
  Filled 2011-08-07: qty 200

## 2011-08-08 ENCOUNTER — Encounter (HOSPITAL_COMMUNITY): Payer: Self-pay | Admitting: Vascular Surgery

## 2011-08-08 ENCOUNTER — Encounter (HOSPITAL_COMMUNITY)
Admission: RE | Disposition: A | Discharge status: Home / Self Care | Payer: Self-pay | Source: Ambulatory Visit | Attending: Surgery

## 2011-08-08 ENCOUNTER — Encounter (HOSPITAL_COMMUNITY): Payer: Self-pay | Admitting: *Deleted

## 2011-08-08 ENCOUNTER — Ambulatory Visit (HOSPITAL_COMMUNITY): Payer: Medicare Other

## 2011-08-08 ENCOUNTER — Ambulatory Visit (HOSPITAL_COMMUNITY): Payer: Medicare Other | Admitting: Vascular Surgery

## 2011-08-08 ENCOUNTER — Inpatient Hospital Stay (HOSPITAL_COMMUNITY)
Admission: RE | Admit: 2011-08-08 | Discharge: 2011-08-09 | DRG: 238 | Disposition: A | Discharge status: Home / Self Care | Payer: Medicare Other | Source: Ambulatory Visit | Attending: Surgery | Admitting: Surgery

## 2011-08-08 ENCOUNTER — Other Ambulatory Visit: Payer: Self-pay | Admitting: *Deleted

## 2011-08-08 DIAGNOSIS — Z85038 Personal history of other malignant neoplasm of large intestine: Secondary | ICD-10-CM

## 2011-08-08 DIAGNOSIS — I714 Abdominal aortic aneurysm, without rupture, unspecified: Secondary | ICD-10-CM

## 2011-08-08 DIAGNOSIS — F411 Generalized anxiety disorder: Secondary | ICD-10-CM | POA: Diagnosis present

## 2011-08-08 DIAGNOSIS — M810 Age-related osteoporosis without current pathological fracture: Secondary | ICD-10-CM | POA: Diagnosis present

## 2011-08-08 DIAGNOSIS — E785 Hyperlipidemia, unspecified: Secondary | ICD-10-CM | POA: Diagnosis present

## 2011-08-08 DIAGNOSIS — M129 Arthropathy, unspecified: Secondary | ICD-10-CM | POA: Diagnosis present

## 2011-08-08 DIAGNOSIS — Z48812 Encounter for surgical aftercare following surgery on the circulatory system: Secondary | ICD-10-CM

## 2011-08-08 DIAGNOSIS — Z79899 Other long term (current) drug therapy: Secondary | ICD-10-CM

## 2011-08-08 DIAGNOSIS — I10 Essential (primary) hypertension: Secondary | ICD-10-CM | POA: Diagnosis present

## 2011-08-08 HISTORY — PX: ENDOVASCULAR STENT INSERTION: SHX5161

## 2011-08-08 LAB — CBC
HCT: 32.9 % — ABNORMAL LOW (ref 36.0–46.0)
Hemoglobin: 11 g/dL — ABNORMAL LOW (ref 12.0–15.0)
MCH: 28.1 pg (ref 26.0–34.0)
MCH: 28.2 pg (ref 26.0–34.0)
MCV: 85.2 fL (ref 78.0–100.0)
Platelets: 140 10*3/uL — ABNORMAL LOW (ref 150–400)
RBC: 3.92 MIL/uL (ref 3.87–5.11)
RDW: 13.8 % (ref 11.5–15.5)
WBC: 6.3 10*3/uL (ref 4.0–10.5)
WBC: 8.3 10*3/uL (ref 4.0–10.5)

## 2011-08-08 LAB — BASIC METABOLIC PANEL
CO2: 29 mEq/L (ref 19–32)
Calcium: 9 mg/dL (ref 8.4–10.5)
Chloride: 108 mEq/L (ref 96–112)
Glucose, Bld: 106 mg/dL — ABNORMAL HIGH (ref 70–99)
Sodium: 145 mEq/L (ref 135–145)

## 2011-08-08 LAB — PROTIME-INR
INR: 1.18 (ref 0.00–1.49)
Prothrombin Time: 15.3 seconds — ABNORMAL HIGH (ref 11.6–15.2)

## 2011-08-08 LAB — CARDIAC PANEL(CRET KIN+CKTOT+MB+TROPI)
CK, MB: 1.7 ng/mL (ref 0.3–4.0)
Relative Index: INVALID (ref 0.0–2.5)
Troponin I: <0.3 ng/mL (ref <0.30)

## 2011-08-08 LAB — APTT: aPTT: 36 seconds (ref 24–37)

## 2011-08-08 LAB — PHOSPHORUS: Phosphorus: 2.4 mg/dL (ref 2.3–4.6)

## 2011-08-08 LAB — MAGNESIUM: Magnesium: 1.7 mg/dL (ref 1.5–2.5)

## 2011-08-08 SURGERY — ENDOVASCULAR STENT GRAFT INSERTION
Anesthesia: General | Site: Groin | Wound class: Clean

## 2011-08-08 MED ORDER — SODIUM CHLORIDE 0.9 % IJ SOLN
INTRAMUSCULAR | Status: AC
  Filled 2011-08-08: qty 30

## 2011-08-08 MED ORDER — MAGNESIUM SULFATE 50 % IJ SOLN
1.0000 g | Freq: Once | INTRAVENOUS | Status: DC
  Filled 2011-08-08: qty 2

## 2011-08-08 MED ORDER — SODIUM CHLORIDE 0.9 % IV SOLN
INTRAVENOUS | Status: DC
  Administered 2011-08-08: 15:00:00 via INTRAVENOUS

## 2011-08-08 MED ORDER — SIMVASTATIN 5 MG PO TABS
10.0000 mg | ORAL_TABLET | Freq: Every day | ORAL | Status: DC
  Filled 2011-08-08 (×2): qty 2

## 2011-08-08 MED ORDER — PROTAMINE SULFATE 10 MG/ML IV SOLN
INTRAVENOUS | Status: DC | PRN
  Administered 2011-08-08: 10 mg via INTRAVENOUS
  Administered 2011-08-08: 30 mg via INTRAVENOUS
  Administered 2011-08-08: 10 mg via INTRAVENOUS

## 2011-08-08 MED ORDER — SODIUM CHLORIDE 0.9 % IR SOLN
Status: DC | PRN
  Administered 2011-08-08: 09:00:00

## 2011-08-08 MED ORDER — SODIUM CHLORIDE 0.9 % IV SOLN: 500.0000 mL | Freq: Once | INTRAVENOUS | Status: AC | PRN

## 2011-08-08 MED ORDER — ALUM & MAG HYDROXIDE-SIMETH 200-200-20 MG/5ML PO SUSP: 15.0000 mL | ORAL | Status: DC | PRN

## 2011-08-08 MED ORDER — PANTOPRAZOLE SODIUM 40 MG PO TBEC
40.0000 mg | DELAYED_RELEASE_TABLET | Freq: Every day | ORAL | Status: DC
  Administered 2011-08-08 – 2011-08-09 (×2): 40 mg via ORAL
  Filled 2011-08-08 (×2): qty 1

## 2011-08-08 MED ORDER — HYDRALAZINE HCL 20 MG/ML IJ SOLN: 10.0000 mg | INTRAMUSCULAR | Status: DC | PRN

## 2011-08-08 MED ORDER — HYDRALAZINE HCL 20 MG/ML IJ SOLN
10.0000 mg | Freq: Four times a day (QID) | INTRAMUSCULAR | Status: DC | PRN
  Administered 2011-08-08 (×2): 10 mg via INTRAVENOUS
  Filled 2011-08-08: qty 0.5

## 2011-08-08 MED ORDER — MIDAZOLAM HCL 5 MG/5ML IJ SOLN
INTRAMUSCULAR | Status: DC | PRN
  Administered 2011-08-08: .5 mg via INTRAVENOUS

## 2011-08-08 MED ORDER — HYDROMORPHONE HCL PF 1 MG/ML IJ SOLN
0.2500 mg | INTRAMUSCULAR | Status: DC | PRN
  Administered 2011-08-08 (×2): 0.5 mg via INTRAVENOUS

## 2011-08-08 MED ORDER — POLYVINYL ALCOHOL 1.4 % OP SOLN
1.0000 [drp] | Freq: Two times a day (BID) | OPHTHALMIC | Status: DC
  Administered 2011-08-08 – 2011-08-09 (×3): 1 [drp] via OPHTHALMIC
  Filled 2011-08-08: qty 15

## 2011-08-08 MED ORDER — 0.9 % SODIUM CHLORIDE (POUR BTL) OPTIME
TOPICAL | Status: DC | PRN
  Administered 2011-08-08: 1000 mL

## 2011-08-08 MED ORDER — VANCOMYCIN HCL IN DEXTROSE 1-5 GM/200ML-% IV SOLN
1000.0000 mg | Freq: Two times a day (BID) | INTRAVENOUS | Status: AC
  Administered 2011-08-08 – 2011-08-09 (×2): 1000 mg via INTRAVENOUS
  Filled 2011-08-08 (×2): qty 200

## 2011-08-08 MED ORDER — MORPHINE SULFATE 2 MG/ML IJ SOLN: 0.0500 mg/kg | INTRAMUSCULAR | Status: DC | PRN

## 2011-08-08 MED ORDER — METOPROLOL TARTRATE 1 MG/ML IV SOLN: 2.0000 mg | INTRAVENOUS | Status: DC | PRN

## 2011-08-08 MED ORDER — ACETAMINOPHEN 325 MG PO TABS: 325.0000 mg | ORAL_TABLET | ORAL | Status: DC | PRN

## 2011-08-08 MED ORDER — IODIXANOL 320 MG/ML IV SOLN
INTRAVENOUS | Status: DC | PRN
  Administered 2011-08-08: 25 mL via INTRAVENOUS

## 2011-08-08 MED ORDER — FENTANYL CITRATE 0.05 MG/ML IJ SOLN
INTRAMUSCULAR | Status: DC | PRN
  Administered 2011-08-08: 50 ug via INTRAVENOUS
  Administered 2011-08-08: 25 ug via INTRAVENOUS
  Administered 2011-08-08: 50 ug via INTRAVENOUS

## 2011-08-08 MED ORDER — LACTATED RINGERS IV SOLN: INTRAVENOUS | Status: DC

## 2011-08-08 MED ORDER — BISACODYL 5 MG PO TBEC: 5.0000 mg | DELAYED_RELEASE_TABLET | Freq: Every day | ORAL | Status: DC | PRN

## 2011-08-08 MED ORDER — MAGNESIUM SULFATE 40 MG/ML IJ SOLN
2.0000 g | Freq: Once | INTRAMUSCULAR | Status: AC | PRN
  Filled 2011-08-08: qty 50

## 2011-08-08 MED ORDER — GABAPENTIN 400 MG PO CAPS
400.0000 mg | ORAL_CAPSULE | Freq: Two times a day (BID) | ORAL | Status: DC
  Administered 2011-08-08 – 2011-08-09 (×2): 400 mg via ORAL
  Filled 2011-08-08 (×3): qty 1

## 2011-08-08 MED ORDER — SENNOSIDES-DOCUSATE SODIUM 8.6-50 MG PO TABS
1.0000 | ORAL_TABLET | Freq: Every evening | ORAL | Status: DC | PRN
  Filled 2011-08-08: qty 1

## 2011-08-08 MED ORDER — POLYETHYL GLYCOL-PROPYL GLYCOL 0.4-0.3 % OP SOLN: 1.0000 [drp] | Freq: Two times a day (BID) | OPHTHALMIC | Status: DC

## 2011-08-08 MED ORDER — DOPAMINE-DEXTROSE 3.2-5 MG/ML-% IV SOLN: 3.0000 ug/kg/min | INTRAVENOUS | Status: DC

## 2011-08-08 MED ORDER — DOCUSATE SODIUM 100 MG PO CAPS
100.0000 mg | ORAL_CAPSULE | Freq: Every day | ORAL | Status: DC
  Administered 2011-08-09: 100 mg via ORAL
  Filled 2011-08-08: qty 1

## 2011-08-08 MED ORDER — ACETAMINOPHEN 650 MG RE SUPP: 325.0000 mg | RECTAL | Status: DC | PRN

## 2011-08-08 MED ORDER — ONDANSETRON HCL 4 MG/2ML IJ SOLN
4.0000 mg | Freq: Four times a day (QID) | INTRAMUSCULAR | Status: DC | PRN
  Administered 2011-08-08: 4 mg via INTRAVENOUS
  Filled 2011-08-08: qty 2

## 2011-08-08 MED ORDER — OXYCODONE HCL 5 MG PO TABS: 5.0000 mg | ORAL_TABLET | ORAL | Status: DC | PRN

## 2011-08-08 MED ORDER — BENAZEPRIL HCL 40 MG PO TABS
40.0000 mg | ORAL_TABLET | Freq: Every day | ORAL | Status: DC
  Administered 2011-08-08 – 2011-08-09 (×2): 40 mg via ORAL
  Filled 2011-08-08 (×2): qty 1

## 2011-08-08 MED ORDER — PHENOL 1.4 % MT LIQD: 1.0000 | OROMUCOSAL | Status: DC | PRN

## 2011-08-08 MED ORDER — METOPROLOL TARTRATE 25 MG PO TABS
25.0000 mg | ORAL_TABLET | Freq: Two times a day (BID) | ORAL | Status: DC
  Administered 2011-08-08 – 2011-08-09 (×2): 25 mg via ORAL
  Filled 2011-08-08 (×3): qty 1

## 2011-08-08 MED ORDER — KETOTIFEN FUMARATE 0.025 % OP SOLN: 1.0000 [drp] | Freq: Two times a day (BID) | OPHTHALMIC | Status: DC

## 2011-08-08 MED ORDER — LABETALOL HCL 5 MG/ML IV SOLN
10.0000 mg | INTRAVENOUS | Status: DC | PRN
  Administered 2011-08-08: 10 mg via INTRAVENOUS
  Filled 2011-08-08: qty 4

## 2011-08-08 MED ORDER — POTASSIUM CHLORIDE CRYS ER 20 MEQ PO TBCR: 20.0000 meq | EXTENDED_RELEASE_TABLET | Freq: Once | ORAL | Status: AC | PRN

## 2011-08-08 MED ORDER — PROPOFOL 10 MG/ML IV EMUL
INTRAVENOUS | Status: DC | PRN
  Administered 2011-08-08: 100 ug/kg/min via INTRAVENOUS

## 2011-08-08 MED ORDER — LIDOCAINE-EPINEPHRINE (PF) 1 %-1:200000 IJ SOLN
INTRAMUSCULAR | Status: DC | PRN
  Administered 2011-08-08: 20 mL

## 2011-08-08 MED ORDER — LACTATED RINGERS IV SOLN
INTRAVENOUS | Status: DC | PRN
  Administered 2011-08-08: 07:00:00 via INTRAVENOUS

## 2011-08-08 MED ORDER — MORPHINE SULFATE 2 MG/ML IJ SOLN
2.0000 mg | INTRAMUSCULAR | Status: DC | PRN
Start: 2011-08-08 — End: 2011-08-09

## 2011-08-08 MED ORDER — HEPARIN SODIUM (PORCINE) 1000 UNIT/ML IJ SOLN
INTRAMUSCULAR | Status: DC | PRN
  Administered 2011-08-08: 6000 [IU] via INTRAVENOUS

## 2011-08-08 MED ORDER — ONDANSETRON HCL 4 MG/2ML IJ SOLN: 4.0000 mg | Freq: Once | INTRAMUSCULAR | Status: DC | PRN

## 2011-08-08 MED ORDER — MAGNESIUM SULFATE IN D5W 10-5 MG/ML-% IV SOLN
1.0000 g | Freq: Once | INTRAVENOUS | Status: AC
  Administered 2011-08-08: 1 g via INTRAVENOUS
  Filled 2011-08-08: qty 100

## 2011-08-08 MED ORDER — IODIXANOL 320 MG/ML IV SOLN
INTRAVENOUS | Status: DC | PRN
  Administered 2011-08-08: 82 mL via INTRAVENOUS

## 2011-08-08 MED ORDER — GUAIFENESIN-DM 100-10 MG/5ML PO SYRP: 15.0000 mL | ORAL_SOLUTION | ORAL | Status: DC | PRN

## 2011-08-08 SURGICAL SUPPLY — 75 items
BAG DECANTER FOR FLEXI CONT (MISCELLANEOUS) ×2 IMPLANT
BALLN CODA OCL 2-9.0-35-120-3 (BALLOONS)
BALLOON COD OCL 2-9.0-35-120-3 (BALLOONS) IMPLANT
CANISTER SUCTION 2500CC (MISCELLANEOUS) ×2 IMPLANT
CATH OMNI FLUSH .035X70CM (CATHETERS) ×2 IMPLANT
CLIP TI MEDIUM 24 (CLIP) IMPLANT
CLIP TI WIDE RED SMALL 24 (CLIP) IMPLANT
CLOTH BEACON ORANGE TIMEOUT ST (SAFETY) ×2 IMPLANT
COVER MAYO STAND STRL (DRAPES) ×2 IMPLANT
COVER PROBE W GEL 5X96 (DRAPES) ×2 IMPLANT
COVER SURGICAL LIGHT HANDLE (MISCELLANEOUS) ×4 IMPLANT
DERMABOND ADHESIVE PROPEN (GAUZE/BANDAGES/DRESSINGS) ×1
DERMABOND ADVANCED (GAUZE/BANDAGES/DRESSINGS) ×1
DERMABOND ADVANCED .7 DNX12 (GAUZE/BANDAGES/DRESSINGS) ×1 IMPLANT
DERMABOND ADVANCED .7 DNX6 (GAUZE/BANDAGES/DRESSINGS) ×1 IMPLANT
DEVICE CLOSURE PERCLS PRGLD 6F (VASCULAR PRODUCTS) ×4 IMPLANT
DEVICE TORQUE 50000 (MISCELLANEOUS) IMPLANT
DRAIN CHANNEL 10F 3/8 F FF (DRAIN) IMPLANT
DRAIN CHANNEL 10M FLAT 3/4 FLT (DRAIN) IMPLANT
DRAPE C-ARM 42X72 X-RAY (DRAPES) ×2 IMPLANT
DRAPE TABLE COVER HEAVY DUTY (DRAPES) ×2 IMPLANT
DRESSING OPSITE X SMALL 2X3 (GAUZE/BANDAGES/DRESSINGS) ×2 IMPLANT
DRYSEAL FLEXSHEATH 18FR 33CM (SHEATH) ×1
ELECT REM PT RETURN 9FT ADLT (ELECTROSURGICAL) ×4
ELECTRODE REM PT RTRN 9FT ADLT (ELECTROSURGICAL) ×2 IMPLANT
EVACUATOR 3/16  PVC DRAIN (DRAIN)
EVACUATOR 3/16 PVC DRAIN (DRAIN) IMPLANT
EVACUATOR SILICONE 100CC (DRAIN) IMPLANT
EXCLUDER TNK LEG 31MX14X17 (Endovascular Graft) ×1 IMPLANT
EXCLUDER TRUNK LEG 31MX14X17 (Endovascular Graft) ×2 IMPLANT
GLOVE BIOGEL PI IND STRL 7.0 (GLOVE) ×1 IMPLANT
GLOVE BIOGEL PI IND STRL 7.5 (GLOVE) ×1 IMPLANT
GLOVE BIOGEL PI INDICATOR 7.0 (GLOVE) ×1
GLOVE BIOGEL PI INDICATOR 7.5 (GLOVE) ×1
GLOVE SURG SS PI 7.5 STRL IVOR (GLOVE) ×2 IMPLANT
GOWN PREVENTION PLUS XXLARGE (GOWN DISPOSABLE) ×2 IMPLANT
GOWN STRL NON-REIN LRG LVL3 (GOWN DISPOSABLE) ×2 IMPLANT
GOWN STRL REIN XL XLG (GOWN DISPOSABLE) ×2 IMPLANT
GRAFT BALLN CATH 65CM (STENTS) IMPLANT
GRAFT EXCLUDER LEG 12X12 (Endovascular Graft) ×2 IMPLANT
GUIDEWIRE AMPLATZ STIFF 0.35 (WIRE) ×4 IMPLANT
HEMOSTAT SNOW SURGICEL 2X4 (HEMOSTASIS) IMPLANT
HEMOSTAT SURGICEL 2X14 (HEMOSTASIS) IMPLANT
KIT BASIN OR (CUSTOM PROCEDURE TRAY) ×2 IMPLANT
KIT ROOM TURNOVER OR (KITS) ×2 IMPLANT
NEEDLE PERC 18GX7CM (NEEDLE) ×2 IMPLANT
NS IRRIG 1000ML POUR BTL (IV SOLUTION) ×2 IMPLANT
PACK AORTA (CUSTOM PROCEDURE TRAY) ×2 IMPLANT
PAD ARMBOARD 7.5X6 YLW CONV (MISCELLANEOUS) ×4 IMPLANT
PENCIL BUTTON HOLSTER BLD 10FT (ELECTRODE) IMPLANT
PERCLOSE PROGLIDE 6F (VASCULAR PRODUCTS) ×8
SHEATH BRITE TIP 8FR 23CM (MISCELLANEOUS) ×2 IMPLANT
SHEATH DRYSEAL FLEX 18FR 33CM (SHEATH) ×1 IMPLANT
SHEATH DRYSEAL GORE 12FRX28 (SHEATH) ×2 IMPLANT
SLEEVE SURGEON STRL (DRAPES) ×2 IMPLANT
STAPLER VISISTAT 35W (STAPLE) IMPLANT
STENT GRAFT BALLN CATH 65CM (STENTS)
STOPCOCK MORSE 400PSI 3WAY (MISCELLANEOUS) ×4 IMPLANT
SUT ETHILON 3 0 PS 1 (SUTURE) IMPLANT
SUT PROLENE 5 0 C 1 24 (SUTURE) IMPLANT
SUT VIC AB 2-0 CT1 36 (SUTURE) IMPLANT
SUT VIC AB 3-0 SH 27 (SUTURE)
SUT VIC AB 3-0 SH 27X BRD (SUTURE) IMPLANT
SUT VICRYL 4-0 PS2 18IN ABS (SUTURE) ×4 IMPLANT
SYR 20CC LL (SYRINGE) ×4 IMPLANT
SYR 30ML LL (SYRINGE) IMPLANT
SYR 5ML LL (SYRINGE) IMPLANT
SYR MEDRAD MARK V 150ML (SYRINGE) ×2 IMPLANT
SYRINGE 10CC LL (SYRINGE) ×6 IMPLANT
TOWEL OR 17X24 6PK STRL BLUE (TOWEL DISPOSABLE) ×4 IMPLANT
TOWEL OR 17X26 10 PK STRL BLUE (TOWEL DISPOSABLE) ×4 IMPLANT
TRAY FOLEY CATH 14FRSI W/METER (CATHETERS) ×2 IMPLANT
TUBING HIGH PRESSURE 120CM (CONNECTOR) ×4 IMPLANT
WATER STERILE IRR 1000ML POUR (IV SOLUTION) ×2 IMPLANT
WIRE BENTSON .035X145CM (WIRE) ×4 IMPLANT

## 2011-08-08 NOTE — Progress Notes (Signed)
eLink Physician-Brief Progress Note Patient Name: Rebecca Knight DOB: 27-Sep-1922 MRN: [id number]  Date of Service  08/08/2011   HPI/Events of Note  Camera rounds shows patient to be pale though normotensive  Ct Abdomen Pelvis Wo Contrast  08/08/2011  *RADIOLOGY REPORT*  Clinical Data: Endograft stent placement this morning.  "Hardness" felt in the lower abdomen/pelvis.  CT ABDOMEN AND PELVIS WITHOUT CONTRAST  Technique:  Multidetector CT imaging of the abdomen and pelvis was performed following the standard protocol without intravenous contrast.  Comparison: Plain film of earlier today.  Prior CT of 07/01/2011.  Findings: Bibasilar atelectasis. Mild cardiomegaly without pericardial or pleural effusion  Evaluation of abdominal parenchyma is degraded by lack of IV contrast.  There is also no oral contrast.  Grossly normal uninfused appearance of the liver, spleen, stomach, pancreas. Cholelithiasis.  Normal adrenal glands.  Contrast within the renal collecting systems, presumably from today's earlier surgery.  Areas of retained contrast within the renal parenchyma are nonspecific but may relate to mild renal insufficiency.  Exophytic interpolar right renal cyst.  Status post aortic Endograft repair.  No gross periaortic hemorrhage.  The native sac measures 4.9 cm on image 44. No retroperitoneal or retrocrural adenopathy.  Colonic stool burden suggests constipation.  Normal caliber of small bowel loops.  No abdominal ascites, pneumatosis, or free intraperitoneal air.  There is edema and subcutaneous gas within the groins bilaterally. There is ill-defined hemorrhage tracking in the left pelvic retroperitoneum up to approximately the pelvic brim.  Example image 41 - 54 of series 2.  The bladder is mildly distended with contrast within.  Soft tissue density within the central pelvis is likely residual uterus. No significant free fluid.  Mild to moderate osteopenia.  Sclerosis in the lateral 9th right rib is likely  due to remote trauma.  Probable left posterior 12th rib remote trauma as well.  IMPRESSION: 1.  Surgical changes of aortic Endograft repair.  Ill-defined hemorrhage within the left pelvic retroperitoneum to approximately the level of the pelvic brim. These results will be called to the ordering clinician or representative by the Radiologist Assistant, and communication documented in the PACS Dashboard. 2.  Mild bladder distention. 3.  Cholelithiasis. 4.  Degraded exam, secondary lack of IV contrast.  Original Report Authenticated By: Consuello Bossier, M.D.   eICU Interventions  Check cbc, bmet, lactate, phois, mag, stat   Intervention Category Major Interventions: Other:  Rebecca Knight 08/08/2011, 5:05 PM

## 2011-08-08 NOTE — Progress Notes (Signed)
08/08/2011 2043  Unable to doppler L PT pulse at shift change. L DP pulse is dopplerable. Dr. Edilia Bo notified. No new orders received. Will continue to monitor.  Rebecca Laakea Pereira, RN

## 2011-08-08 NOTE — Progress Notes (Signed)
Dr Myra Gianotti here to see pt. Aware cuff BP lower than art line, Sbp in 170's. Aware abd a little distended. Xrays have been done.

## 2011-08-08 NOTE — Anesthesia Procedure Notes (Signed)
Procedures [phone]: Procedures: Right IJ CVP Catheter Insertion: The patient was identified and consent obtained.  TO was performed, and full barrier precautions were used.  The skin was anesthetized with lidocaine-4cc plain with 25g needle.  Once the vein was located with the 22 ga. needle using ultrasound guidance , the wire was inserted into the vein.  The wire location was confirmed with ultrasound.  The tissue was dilated and the 8.5 [place] cordis catheter was carefully inserted.   The patient tolerated the procedure well.   CE

## 2011-08-08 NOTE — H&P (Signed)
[facility]in [facility]ist of [facility]  Patient name: Rebecca Knight MRN: [id number] DOB: 08/16/1922 Sex: female  Chief Complaint   Patient presents with   .  AAA     CTA Abd/Pelvis - Carotid U/S and ABI's prior    HISTORY OF PRESENT ILLNESS:  The patient comes back today for further evaluation of her abdominal aortic aneurysm. She had a CT scan prior to my visit. She is without abdominal or back pain today.  Past Medical History   Diagnosis  Date   .  Hypertension    .  Hyperlipidemia    .  Heart murmur    .  Arthritis    .  AAA (abdominal aortic aneurysm)    .  Varicose veins    .  Anxiety    .  Osteoporosis    .  Cancer  1995     colon cancer   .  Herpes zoster of eye  2002     Dr. Elmer Picker   .  Post herpetic neuralgia    .  Diverticulosis of colon    .  Calculus of gallbladder without mention of cholecystitis or obstruction     History reviewed. No pertinent past surgical history.  History    Social History   .  Marital Status:  Married     Spouse Name:  N/A     Number of Children:  N/A   .  Years of Education:  N/A    Occupational History   .  Not on file.    Social History Main Topics   .  Smoking status:  Never Smoker   .  Smokeless tobacco:  Not on file   .  Alcohol Use:  No   .  Drug Use:  No   .  Sexually Active:     Other Topics  Concern   .  Not on file    Social History Narrative   .  No narrative on file    Family History   Problem  Relation  Age of Onset   .  Heart disease  Other    .  Heart disease  Father    .  Hypertension  Father    .  Heart attack  Father    .  Diabetes  Sister    .  Heart attack  Sister    .  Diabetes  Brother    .  Heart disease  Brother    .  Heart attack  Brother     Allergies as of 07/01/2011 - Review Complete 07/01/2011   Allergen  Reaction  Noted   .  Penicillins   11/20/2006    Current Outpatient Prescriptions on File Prior to Visit   Medication  Sig  Dispense  Refill   .  benazepril (LOTENSIN) 40 MG  tablet  Take 1 tablet (40 mg total) by mouth daily.  90 tablet  0   .  calcium-vitamin D (OSCAL) 250-125 MG-UNIT per tablet  Take 1 tablet by mouth daily.     .  Cholecalciferol (VITAMIN D-3 PO)  Take by mouth daily.     .  fish oil-omega-3 fatty acids 1000 MG capsule  Take 1 g by mouth daily.     Marland Kitchen  gabapentin (NEURONTIN) 400 MG capsule  TAKE ONE CAPSULE BY MOUTH TWICE A DAY  180 capsule  0   .  Multiple Vitamin (MULTIVITAMIN) tablet  Take 1 tablet by mouth daily.     Marland Kitchen  pravastatin (PRAVACHOL) 20 MG tablet  TAKE 1 TABLET EVERY DAY  90 tablet  0   .  ketotifen (ALAWAY) 0.025 % ophthalmic solution  1 drop 2 (two) times daily.     .  naproxen sodium (ANAPROX) 220 MG tablet  Take 220 mg by mouth as needed.     Marland Kitchen  omeprazole (PRILOSEC) 20 MG capsule  Take 1 capsule (20 mg total) by mouth daily.  30 capsule  7   .  Probiotic Product (PROBIOTIC FORMULA) CAPS  Take by mouth.      No current facility-administered medications on file prior to visit.    REVIEW OF SYSTEMS:  No changes from prior visit  PHYSICAL EXAMINATION:  Vital signs are BP 171/83  Pulse 74  Resp 16  Ht 5' (1.524 m)  Wt 123 lb (55.792 kg)  BMI 24.02 kg/m2  SpO2 98%  General: The patient appears their stated age.  HEENT: No gross abnormalities  Pulmonary: Non labored breathing  Abdomen: Soft and non-tender  Musculoskeletal: There are no major deformities.  Neurologic: No focal weakness or paresthesias are detected,  Skin: There are no ulcer or rashes noted.  Psychiatric: The patient has normal affect.  Cardiovascular: I cannot palpate pedal pulses  Diagnostic Studies  Carotid duplex: 1-39% stenosis bilaterally  ABI: 0.98 on the right 1.0 on the left  CT angiogram: Increase in the size of the infrarenal component to her aneurysm from 4.2-4.9 mm. This is over a 7 month.  Assessment:  Abdominal aortic aneurysm  Plan:  Based on the rate of growth (7 mm in 7 months) I feel that we need to address her aneurysm. I've  recommended proceeding with endovascular repair. I think this can be done percutaneously. I discussed the risks and benefits of the procedure with the patient including the risk of death the risk of cardiopulmonary complications risk of renal complications, the risk of intestinal and lower extremity complications. The patient takes care of at daughter at home and needs to make arrangements for this prior operation. We have tentatively scheduled her for Thursday, April 18. I'm sending her for a Myoview. I spent in excess of 45 minutes discussing the CT scan findings with the patient, reviewing her information and planning her operation  V. Charlena Cross, M.D.  [facility] of [place]  Office: [phone]  Pager: [phone]

## 2011-08-08 NOTE — Op Note (Signed)
Vascular and Vein Specialists of [facility]  Patient name: ILYSSA GRENNAN MRN: [id number] DOB: 1922/12/07 Sex: female  08/08/2011 Pre-operative Diagnosis: Abdominal aortic aneurysm Post-operative diagnosis:  Same Surgeon:  Jorge Ny Assistants:  M.Collins Procedure:   Percutaneous endovascular abdominal aortic aneurysm repair Anesthesia:  Conscious sedation Blood Loss:  See anesthesia record Specimens:  None  Findings:  Complete exclusion Devices used: Main body (primary left) GORE Excluder 31 x 14 x 17, contralateral extension (right) Gore Excluder 12 x 12  Indications:  The patient comes in today for repair of her abdominal aortic aneurysm. She has had a significant increase in the size of her aneurysm over the past 7 months.  Procedure:  The patient was identified in the holding area and taken to [facility] OR ROOM 09  The patient was then placed supine on the table. IV sedation anesthesia was administered.  The patient was prepped and draped in the usual sterile fashion.  A time out was called and antibiotics were administered.  Ultrasound was used to evaluate the right and left common femoral arteries. They were without significant plaque and widely patent. Digital ultrasound images were acquired. 11 blade was used to make a skin nick on each side. An 18-gauge needle was used to access bilateral common femoral arteries under ultrasound guidance. An 035 wires were advanced. The groins were prepared for pre-closure using a Proglide device deployed in the 11:00 and 1:00 position. 8 French sheaths were placed bilaterally. The patient was fully heparinized. A contrast injection was performed with the catheter on the left side to determine the appropriate length of the main body device. I then upsized to a stiff wire an 18 French sheath on the left. On the right a 73 French sheath was placed over a stiff wire. The pigtail catheter was then advanced up the right side. The main body device was  selected and prepared on the back table. This was a Biomedical scientist 31 x 14 x 17. The device was then advanced through the sheath on the left groin up to the level of the renal arteries. A contrast injection was performed and the device was deployed at the level of the renal arteries. A followup study revealed that the graft had migrated slightly and I felt it was too far below the renal arteries. The main body was reconstructing and then advanced more proximally. This had to be done several times with contrast injections. Ultimately I was satisfied with the placement of the graft. During manipulation of the proximal portion of the graft it became apparent that the sheath had been withdrawn from the groin and there was a small hematoma in the left groin. At this point I performed a contrast injection from the sheath on the right side with the image intensifier and a right anterior oblique position. This identified the origin of the left hypogastric artery. I then deployed the ipsilateral limb. The device was then removed. I reinserted the dilator into the 18 French sheath and advanced the sheath back into the artery. Next the gate was cannulated with the Omni flush catheter and a Benson wire. The catheter was advanced into the main body portion of the device and able to be freely rotated confirming successful cannulation. The image intensifier was rotated to a left anterior oblique position and a retrograde arteriogram through the sheath in the right groin was performed which located the right hypogastric artery. I then selected the appropriate contralateral limb this was a Biomedical scientist 12 x  12. The dilator for the sheath was then reinserted and the sheath was advanced into the contralateral gate. The contralateral limb was then advanced through the sheath and placed into the appropriate position. The sheath was then withdrawn and the device deployed. Next a Q-50 balloon was used to mold the proximal and distal  portions of the device as well as the overlapping segments. A completion arteriogram was then performed which showed widely patent bilateral renal arteries, preservation of both internal and external iliac arteries as well as complete exclusion of the aneurysm. There was a late type II leak. I was satisfied with our final results. Bentson wires were exchanged for on both sides. The groins were then closed by completing the Proglide devices. 50 mg of protamine was then administered. Once I was satisfied with the hemostasis the skin incisions were closed with 4-0 Vicryl and Dermabond was placed on the skin. The patient had excellent Doppler signals in her dorsalis pedis and posterior tibial arteries at the ankle. She was taken the recovery room in stable condition. There were no complications.   Disposition:  To PACU in stable condition.   Juleen China, M.D. [facility] of [place] Office: [phone] Pager:  [phone]

## 2011-08-08 NOTE — Progress Notes (Signed)
eLink Physician-Brief Progress Note Patient Name: DANIRA NYLANDER DOB: [birth year] MRN: [id number]  Date of Service  08/08/2011   HPI/Events of Note    Lab 08/08/11 1716 08/08/11 1020  HGB 10.9* 11.0*    Lab 08/08/11 1716 08/08/11 1020  NA -- 145  K -- 3.5  CL -- 108  CO2 -- 29  GLUCOSE -- 106*  BUN -- 11  CREATININE -- 0.69  CALCIUM -- 9.0  MG 1.6 1.7  PHOS 2.4 --     Lab 08/08/11 1716  TROPONINI <0.30     eICU Interventions  Hgb stable - monitor MAg 1.6 - will replete   Intervention Category Intermediate Interventions: Diagnostic test evaluation;Electrolyte abnormality - evaluation and management  Alfred Harrel 08/08/2011, 10:28 PM

## 2011-08-08 NOTE — Anesthesia Postprocedure Evaluation (Signed)
  Anesthesia Post-op Note  Patient: Rebecca Knight  Procedure(s) Performed: Procedure(s) (LRB): ENDOVASCULAR STENT GRAFT INSERTION (N/A)  Patient Location: PACU  Anesthesia Type: General  Level of Consciousness: awake  Airway and Oxygen Therapy: Patient Spontanous Breathing  Post-op Pain: mild  Post-op Assessment: Post-op Vital signs reviewed  Post-op Vital Signs: Reviewed  Complications: No apparent anesthesia complications

## 2011-08-08 NOTE — Transfer of Care (Signed)
Immediate Anesthesia Transfer of Care Note  Patient: Rebecca Knight  Procedure(s) Performed: Procedure(s) (LRB): ENDOVASCULAR STENT GRAFT INSERTION (N/A)  Patient Location: PACU  Anesthesia Type: MAC  Level of Consciousness: awake, alert , oriented and patient cooperative  Airway & Oxygen Therapy: Patient Spontanous Breathing and Patient connected to face mask oxygen  Post-op Assessment: Report given to PACU RN, Post -op Vital signs reviewed and stable and Patient moving all extremities X 4  Post vital signs: Reviewed and stable  Complications: No apparent anesthesia complications

## 2011-08-08 NOTE — Progress Notes (Signed)
Abdomen appears a bit more distended than earlier .  Dr Myra Gianotti and PA Narda Amber at bedside.  Pt to ct for abd scan. Will cont to monitor closely.

## 2011-08-08 NOTE — Preoperative (Signed)
Beta Blockers   Reason not to administer Beta Blockers:Not Applicable 

## 2011-08-08 NOTE — Anesthesia Preprocedure Evaluation (Addendum)
Anesthesia Evaluation  Patient identified by MRN, date of birth, ID band Patient awake    Reviewed: Allergy & Precautions, H&P , NPO status , Patient's Chart, lab work & pertinent test results, reviewed documented beta blocker date and time   Airway Mallampati: I TM Distance: >3 FB Neck ROM: Full    Dental  (+) Teeth Intact and Dental Advisory Given   Pulmonary neg pulmonary ROS,  breath sounds clear to auscultation        Cardiovascular hypertension, Pt. on medications Rhythm:Regular Rate:Normal     Neuro/Psych Anxiety negative neurological ROS     GI/Hepatic Neg liver ROS,   Endo/Other  negative endocrine ROS  Renal/GU negative Renal ROS     Musculoskeletal   Abdominal   Peds  Hematology   Anesthesia Other Findings   Reproductive/Obstetrics                         Anesthesia Physical Anesthesia Plan  ASA: III  Anesthesia Plan: General   Post-op Pain Management:    Induction: Intravenous  Airway Management Planned: Oral ETT  Additional Equipment: Arterial line and CVP  Intra-op Plan:   Post-operative Plan: Extubation in OR  Informed Consent: I have reviewed the patients History and Physical, chart, labs and discussed the procedure including the risks, benefits and alternatives for the proposed anesthesia with the patient or authorized representative who has indicated his/her understanding and acceptance.   Dental advisory given  Plan Discussed with: CRNA, Anesthesiologist and Surgeon  Anesthesia Plan Comments:         Anesthesia Quick Evaluation

## 2011-08-09 ENCOUNTER — Encounter (HOSPITAL_COMMUNITY): Payer: Self-pay | Admitting: Surgery

## 2011-08-09 LAB — COMPREHENSIVE METABOLIC PANEL
AST: 15 U/L (ref 0–37)
Albumin: 3 g/dL — ABNORMAL LOW (ref 3.5–5.2)
Alkaline Phosphatase: 56 U/L (ref 39–117)
BUN: 9 mg/dL (ref 6–23)
Potassium: 3.1 mEq/L — ABNORMAL LOW (ref 3.5–5.1)
Sodium: 142 mEq/L (ref 135–145)
Total Protein: 5.2 g/dL — ABNORMAL LOW (ref 6.0–8.3)

## 2011-08-09 LAB — CBC
MCV: 84.4 fL (ref 78.0–100.0)
Platelets: 158 10*3/uL (ref 150–400)
RBC: 3.52 MIL/uL — ABNORMAL LOW (ref 3.87–5.11)
RDW: 14.1 % (ref 11.5–15.5)
WBC: 9.2 10*3/uL (ref 4.0–10.5)

## 2011-08-09 MED ORDER — OXYCODONE HCL 5 MG PO TABS: 5.0000 mg | ORAL_TABLET | Freq: Four times a day (QID) | ORAL | Status: AC | PRN

## 2011-08-09 NOTE — Progress Notes (Signed)
Utilization review completed. Kalid Ghan, RN, BSN. 08/09/11 

## 2011-08-09 NOTE — Progress Notes (Signed)
  Discussed discharge instructions and medications with patient and family member. Answered all questions. Vss. Pt has no complaints of pain. Left groin is Clean dry and intact with purple bruising with small hematoma, md aware. Right groin is clean dry and intact.  Left DP and Right DP and PT pulses are dopplerable. All IV's are dc'ed with tips intact. Pt discharged home with family.  Rebecca Knight Annette 4/19/20132:13 PM

## 2011-08-09 NOTE — Discharge Summary (Signed)
Vascular and Vein Specialists Discharge Summary  Rebecca Knight 07/30/1922 78 y.o. female  [id number]  Admission Date: 08/08/2011  Discharge Date: 08/09/11  Physician: Rebecca Libman, MD  Admission Diagnosis: AAA   HPI:   This is a 78 y.o. female comes back today for further evaluation of her abdominal aortic aneurysm. She had a CT scan prior to my visit. She is without abdominal or back pain today.   Hospital Course:  The patient was admitted to the hospital and taken to the operating room on 08/08/2011 and underwent Percutaneous endovascular abdominal aortic aneurysm repair.  The pt tolerated the procedure well and was transported to the PACU in good condition. Pt's abdomen was distended post operatively and the pt was sent for a CT scan of the abdomen. The results are as follows: IMPRESSION:  1. Surgical changes of aortic Endograft repair. Ill-defined  hemorrhage within the left pelvic retroperitoneum to approximately  the level of the pelvic brim. These results will be called to the  ordering clinician or representative by the Radiologist Assistant,  and communication documented in the PACS Dashboard.  2. Mild bladder distention.  3. Cholelithiasis.  4. Degraded exam, secondary lack of IV contrast  By POD 1, she is doing well and is discharged home.  The remainder of the hospital course consisted of increasing ambulation and increasing intake of solids without difficulty.  CBC    Component Value Date/Time   WBC 9.2 08/09/2011 0411   RBC 3.52* 08/09/2011 0411   HGB 9.7* 08/09/2011 0411   HCT 29.7* 08/09/2011 0411   PLT 158 08/09/2011 0411   MCV 84.4 08/09/2011 0411   MCH 27.6 08/09/2011 0411   MCHC 32.7 08/09/2011 0411   RDW 14.1 08/09/2011 0411   LYMPHSABS 1.7 08/01/2011 1121   MONOABS 0.4 08/01/2011 1121   EOSABS 0.3 08/01/2011 1121   BASOSABS 0.1 08/01/2011 1121    BMET    Component Value Date/Time   NA 142 08/09/2011 0411   K 3.1* 08/09/2011 0411   CL 107 08/09/2011  0411   CO2 27 08/09/2011 0411   GLUCOSE 107* 08/09/2011 0411   BUN 9 08/09/2011 0411   CREATININE 0.78 08/09/2011 0411   CREATININE 0.81 06/28/2011 1321   CALCIUM 8.7 08/09/2011 0411   GFRNONAA 73* 08/09/2011 0411   GFRAA 85* 08/09/2011 0411     Discharge Instructions:   The patient is discharged to home with extensive instructions on wound care and progressive ambulation.  They are instructed not to drive or perform any heavy lifting until returning to see the physician in his office.  Discharge Orders    Future Appointments: Provider: Department: Dept Phone: Center:   09/09/2011 1:45 PM Rebecca Libman, MD [facility] [phone] [facility]     Future Orders Please Complete By Expires   Resume previous diet      Driving Restrictions      Comments:   No driving for 2 weeks and while taking pain medication   Lifting restrictions      Comments:   No lifting for 6 weeks   Call MD for:  temperature >100.5      Call MD for:  redness, tenderness, or signs of infection (pain, swelling, bleeding, redness, odor or green/yellow discharge around incision site)      Call MD for:  severe or increased pain, loss or decreased feeling  in affected limb(s)      ABDOMINAL PROCEDURE/ANEURYSM REPAIR/AORTO-BIFEMORAL BYPASS:  Call MD for increased abdominal pain; cramping diarrhea; nausea/vomiting  may wash over wound with mild soap and water      Scheduling Instructions:   Shower daily with soap and water starting 08/10/11       Discharge Diagnosis:  AAA  Secondary Diagnosis: Patient Active Problem List  Diagnoses  . POSTHERPETIC NEURALGIA  . ADENOCARCINOMA, COLON  . NEOP, BNG, LARGE INTESTINE  . THYROID NODULE, RIGHT  . HYPERLIPIDEMIA  . HYPERTENSION, ESSENTIAL NOS  . DIVERTICULOSIS, COLON  . OSTEOPOROSIS NOS  . COLON CANCER, HX OF  . ABDOMINAL ANEURYSM WITHOUT MENTION OF RUPTURE  . CHOLELITHIASIS  . NONSPEC ELEVATION OF LEVELS OF TRANSAMINASE/LDH  . MELENA, HX OF   Past Medical History    Diagnosis Date  . Hypertension   . Hyperlipidemia   . Heart murmur   . Arthritis   . AAA (abdominal aortic aneurysm)   . Varicose veins   . Anxiety   . Osteoporosis   . Cancer 1995    colon cancer  . Herpes zoster of eye 2002    Dr. Elmer Knight  . Post herpetic neuralgia   . Diverticulosis of colon   . Calculus of gallbladder without mention of cholecystitis or obstruction       Rebecca Knight  Home Medication Instructions [id number]   Printed on:08/09/11 1025  Medication Information                    calcium-vitamin D (OSCAL) 250-125 MG-UNIT per tablet Take 1 tablet by mouth daily.           naproxen sodium (ANAPROX) 220 MG tablet Take 220 mg by mouth daily as needed. For pain           ketotifen (ALAWAY) 0.025 % ophthalmic solution Place 1 drop into both eyes 2 (two) times daily.            fish oil-omega-3 fatty acids 1000 MG capsule Take 1 g by mouth daily.           metoprolol tartrate (LOPRESSOR) 25 MG tablet Take 1 tablet (25 mg total) by mouth 2 (two) times daily.           pravastatin (PRAVACHOL) 20 MG tablet Take 1 tablet (20 mg total) by mouth daily.           benazepril (LOTENSIN) 40 MG tablet Take 40 mg by mouth daily.           gabapentin (NEURONTIN) 400 MG capsule Take 400 mg by mouth 2 (two) times daily.           Polyethyl Glycol-Propyl Glycol (SYSTANE) 0.4-0.3 % SOLN Apply 1 drop to eye 2 (two) times daily.           oxyCODONE (OXY IR/ROXICODONE) 5 MG immediate release tablet Take 1 tablet (5 mg total) by mouth every 6 (six) hours as needed.             Disposition: home  Patient's condition: is Good  Follow up: 1. Rebecca Knight in 4 weeks with CT Scan   Rebecca Pigg, PA-C [facility] [phone] 08/09/2011  10:25 AM

## 2011-08-09 NOTE — Progress Notes (Signed)
[facility] of [place]  Subjective  - POD #1 s/p EVAR  Feels good this am.  Ambulated last night.  Tolerating diet   Physical Exam:  Abdomen soft Both groins level 0, non tender Legs warm and pink        Assessment/Plan:  POD #1  D/c home today F/u 1 month with CTA abd/pelvis  Tinamarie Przybylski IV, V. WELLS 08/09/2011 10:10 AM --  Filed Vitals:   08/09/11 0939  BP: 134/62  Pulse: 82  Temp:   Resp:     Intake/Output Summary (Last 24 hours) at 08/09/11 1010 Last data filed at 08/09/11 0900  Gross per 24 hour  Intake   1870 ml  Output   1390 ml  Net    480 ml     Laboratory CBC    Component Value Date/Time   WBC 9.2 08/09/2011 0411   HGB 9.7* 08/09/2011 0411   HCT 29.7* 08/09/2011 0411   PLT 158 08/09/2011 0411    BMET    Component Value Date/Time   NA 142 08/09/2011 0411   K 3.1* 08/09/2011 0411   CL 107 08/09/2011 0411   CO2 27 08/09/2011 0411   GLUCOSE 107* 08/09/2011 0411   BUN 9 08/09/2011 0411   CREATININE 0.78 08/09/2011 0411   CREATININE 0.81 06/28/2011 1321   CALCIUM 8.7 08/09/2011 0411   GFRNONAA 73* 08/09/2011 0411   GFRAA 85* 08/09/2011 0411    COAG Lab Results  Component Value Date   INR 1.18 08/08/2011   INR 1.01 08/01/2011   No results found for this basename: PTT    Antibiotics Anti-infectives     Start     Dose/Rate Route Frequency Ordered Stop   08/08/11 2000   vancomycin (VANCOCIN) IVPB 1000 mg/200 mL premix        1,000 mg 200 mL/hr over 60 Minutes Intravenous Every 12 hours 08/08/11 1621 08/09/11 0848   08/08/11 0600   vancomycin (VANCOCIN) IVPB 1000 mg/200 mL premix        1,000 mg 200 mL/hr over 60 Minutes Intravenous On call to O.R. 08/07/11 1340 08/08/11 0743           V. Charlena Cross, M.D. [facility] [place] Office: [phone] Pager:  [phone]

## 2011-08-09 NOTE — Discharge Summary (Signed)
Agree with above  Rebecca Knight 

## 2011-08-09 NOTE — Discharge Summary (Signed)
[facility] Discharge Summary   Patient ID:  Rebecca Knight MRN: [id number] DOB/AGE: Rebecca Knight 78 y.o.  Admit date: 08/08/2011 Discharge date: 08/09/2011 Date of Surgery: 08/08/2011 Surgeon: Surgeon(s): Nada Libman, MD  Admission Diagnosis: AAA  Discharge Diagnoses:  AAA  Secondary Diagnoses: Past Medical History  Diagnosis Date  . Hypertension   . Hyperlipidemia   . Heart murmur   . Arthritis   . AAA (abdominal aortic aneurysm)   . Varicose veins   . Anxiety   . Osteoporosis   . Cancer 1995    colon cancer  . Herpes zoster of eye 2002    Dr. Elmer Picker  . Post herpetic neuralgia   . Diverticulosis of colon   . Calculus of gallbladder without mention of cholecystitis or obstruction     Procedure(s): ENDOVASCULAR STENT GRAFT INSERTION  Discharged Condition: good  HPI: Rebecca Knight is a 77 y.o. female ha had a known Abdominal aortic aneurysm  Based on the rate of growth (7 mm in 7 months) Dr. Myra Gianotti feel that we need to address her aneurysm. He recommended proceeding with endovascular repair. This can be done percutaneously.  the risks and benefits of the procedure were discussed with the patient including the risk of death the risk of cardiopulmonary complications risk of renal complications, the risk of intestinal and lower extremity complications.  We have tentatively scheduled her for Thursday, Rebecca 20. She had preoperative cardiac clearance  Hospital Course:  Rebecca Knight is a 78 y.o. female is S/P Procedure(s): ENDOVASCULAR STENT GRAFT INSERTION Extubated: POD # 0 Post-op wounds healing well with minimal ecchymoses but no hematoma Pt. Ambulating, voiding and taking PO diet without difficulty. Pt pain controlled with PO pain meds. Labs as below Complications:none  Consults:     Significant Diagnostic Studies: CBC Lab Results  Component Value Date   WBC 9.2 08/09/2011   HGB 9.7* 08/09/2011   HCT 29.7* 08/09/2011   MCV 84.4  08/09/2011   PLT 158 08/09/2011    BMET    Component Value Date/Time   NA 142 08/09/2011 0411   K 3.1* 08/09/2011 0411   CL 107 08/09/2011 0411   CO2 27 08/09/2011 0411   GLUCOSE 107* 08/09/2011 0411   BUN 9 08/09/2011 0411   CREATININE 0.78 08/09/2011 0411   CREATININE 0.81 06/28/2011 1321   CALCIUM 8.7 08/09/2011 0411   GFRNONAA 73* 08/09/2011 0411   GFRAA 85* 08/09/2011 0411   COAG Lab Results  Component Value Date   INR 1.18 08/08/2011   INR 1.01 08/01/2011     Disposition:  Discharge to :Home Discharge Orders    Future Appointments: Provider: Department: Dept Phone: Center:   09/09/2011 1:45 PM Nada Libman, MD [facility] [phone] [facility]      [name], [name]  Home Medication Instructions QIO:[id number]   Printed on:08/09/11 1020  Medication Information                    calcium-vitamin D (OSCAL) 250-125 MG-UNIT per tablet Take 1 tablet by mouth daily.           naproxen sodium (ANAPROX) 220 MG tablet Take 220 mg by mouth daily as needed. For pain           ketotifen (ALAWAY) 0.025 % ophthalmic solution Place 1 drop into both eyes 2 (two) times daily.            fish oil-omega-3 fatty acids 1000 MG capsule Take 1 g by mouth  daily.           metoprolol tartrate (LOPRESSOR) 25 MG tablet Take 1 tablet (25 mg total) by mouth 2 (two) times daily.           pravastatin (PRAVACHOL) 20 MG tablet Take 1 tablet (20 mg total) by mouth daily.           benazepril (LOTENSIN) 40 MG tablet Take 40 mg by mouth daily.           gabapentin (NEURONTIN) 400 MG capsule Take 400 mg by mouth 2 (two) times daily.           Polyethyl Glycol-Propyl Glycol (SYSTANE) 0.4-0.3 % SOLN Apply 1 drop to eye 2 (two) times daily.            Verbal and written Discharge instructions given to the patient. Wound care per Discharge AVS   Signed: Marlowe Shores 08/09/2011, 10:20 AM

## 2011-09-06 ENCOUNTER — Encounter: Payer: Self-pay | Admitting: Surgery

## 2011-09-09 ENCOUNTER — Ambulatory Visit (INDEPENDENT_AMBULATORY_CARE_PROVIDER_SITE_OTHER): Payer: Medicare Other | Admitting: Surgery

## 2011-09-09 ENCOUNTER — Encounter: Payer: Self-pay | Admitting: Surgery

## 2011-09-09 ENCOUNTER — Ambulatory Visit
Admission: RE | Admit: 2011-09-09 | Discharge: 2011-09-09 | Disposition: A | Discharge status: Home / Self Care | Payer: Medicare Other | Source: Ambulatory Visit | Attending: Surgery | Admitting: Surgery

## 2011-09-09 VITALS — BP 175/68 | HR 76 | Temp 98.0°F | Ht 60.0 in | Wt 120.0 lb

## 2011-09-09 DIAGNOSIS — Z48812 Encounter for surgical aftercare following surgery on the circulatory system: Secondary | ICD-10-CM

## 2011-09-09 DIAGNOSIS — I714 Abdominal aortic aneurysm, without rupture, unspecified: Secondary | ICD-10-CM

## 2011-09-09 MED ORDER — IOHEXOL 350 MG/ML SOLN
100.0000 mL | Freq: Once | INTRAVENOUS | Status: AC | PRN
  Administered 2011-09-09: 100 mL via INTRAVENOUS

## 2011-09-09 NOTE — Progress Notes (Signed)
The patient comes in today for followup. She is status post endovascular repair of an abdominal aortic aneurysm which was performed on 08/08/2011. The indication for repair was a significant increase in size over a six-month period. Her procedure was done using local anesthesia and IV sedation. Her procedure was uncomplicated. She is back today without complaints.  CT angiogram was ordered and reviewed today. This shows the stent graft to be in good position. There is no evidence of endoleak.  On examination her extremities are warm well perfused both groin sites are clean  Overall she is doing very well following repair. I will see her back in 6 months with an abdominal ultrasound

## 2011-09-09 NOTE — Progress Notes (Signed)
Addended by: Sharee Pimple on: 09/09/2011 04:03 PM   Modules accepted: Orders

## 2011-09-11 ENCOUNTER — Other Ambulatory Visit: Payer: Self-pay | Admitting: Internal Medicine

## 2011-09-26 ENCOUNTER — Ambulatory Visit (INDEPENDENT_AMBULATORY_CARE_PROVIDER_SITE_OTHER): Payer: Medicare Other | Admitting: Internal Medicine

## 2011-09-26 ENCOUNTER — Encounter: Payer: Self-pay | Admitting: Internal Medicine

## 2011-09-26 VITALS — BP 130/88 | HR 102 | Temp 98.1°F | Wt 120.0 lb

## 2011-09-26 DIAGNOSIS — R1013 Epigastric pain: Secondary | ICD-10-CM

## 2011-09-26 DIAGNOSIS — I714 Abdominal aortic aneurysm, without rupture: Secondary | ICD-10-CM

## 2011-09-26 DIAGNOSIS — R609 Edema, unspecified: Secondary | ICD-10-CM

## 2011-09-26 DIAGNOSIS — Z0000 Encounter for general adult medical examination without abnormal findings: Secondary | ICD-10-CM

## 2011-09-26 DIAGNOSIS — I10 Essential (primary) hypertension: Secondary | ICD-10-CM

## 2011-09-26 DIAGNOSIS — R350 Frequency of micturition: Secondary | ICD-10-CM

## 2011-09-26 NOTE — Progress Notes (Signed)
Subjective:    Patient ID: Rebecca Knight, female    DOB: Nov 02, 1922, 78 y.o.   MRN: [id number]  HPI Medicare Wellness Visit:  The following psychosocial & medical history were reviewed as required by Medicare.   Social history: caffeine:2 colas / week , alcohol:  no,  tobacco use : never  & exercise :no program  Home & personal  safety / fall risk: some imbalance, activities of daily living: no limitations , seatbelt use : yes , and smoke alarm employment : yes.  Power of Attorney/Living Will status : needed  Vision ( as recorded per Nurse) & Hearing  evaluation :  Ophth exam < 12 mos. Decreased hearing , no evaluation to date. Orientation :oriented X 3 , memory & recall :good,  math testing: good,and mood & affect : normal . Depression / anxiety: worried about move to retirement facility Travel history : 1983 [place] , immunization status :Shingles declined , transfusion history: no, and preventive health surveillance ( colonoscopies, BMD , etc as per protocol/ [facility]): colonoscopy up to date, Dental care:  Overdue due to finances  . Chart reviewed &  Updated. Active issues reviewed & addressed.       Review of Systems HYPERTENSION: Disease Monitoring: Blood pressure range- elevated, list not brought  Chest pain, palpitations- no      Dyspnea- no Medications: Compliance- yes  Lightheadedness,Syncope- no    Edema- yes  HYPERLIPIDEMIA: Disease Monitoring: See symptoms for Hypertension Medications: Compliance- yes  Abd pain, bowel changes-epigastric pain 09/25/11 after taking OTC for increased "gas"   , resolved over several hours . She lost 5 pounds after her vascular surgery. She denies rectal bleeding or melena Muscle aches- no    GEX:BMWUXLKG/MWNUUV/OZDGUY- excess urination       Visual problems- no          Objective:   Physical Exam Gen.: Thin but well-nourished in appearance. Alert, appropriate and cooperative throughout exam. Appears younger than stated age  Head:  Normocephalic without obvious abnormalities Eyes: No corneal or conjunctival inflammation noted. Pupils equal round reactive to light and accommodation.  Extraocular motion intact. Vision grossly normal.Minimal OD ptosis Ears: External  ear exam reveals no significant lesions or deformities. Canals clear .TMs normal. Hearing is grossly decreased bilaterally. Nose: External nasal exam reveals no deformity or inflammation. Nasal mucosa are pink and moist. No lesions or exudates noted.  Mouth: Oral mucosa and oropharynx reveal no lesions or exudates. Teeth in fair-poor repair. Neck: No deformities, masses, or tenderness noted.  Thyroid normal Lungs: Normal respiratory effort; chest expands symmetrically. Lungs are clear to auscultation without rales, wheezes, or increased work of breathing. Heart: Normal rate and rhythm. Normal S1 and S2. No gallop, click, or rub.Grade 1/6 systolic murmur  Abdomen: Bowel sounds normal; abdomen soft and nontender. No masses, organomegaly or hernias noted.Aorta palpable  Genitalia: deferred  .                                                                                   Musculoskeletal/extremities: There is some asymmetry of the posterior thoracic musculature suggesting occult scoliosis.  No clubbing, cyanosis, or significant  deformity noted. Range of motion  normal .Tone & strength  normal.Joints normal. Nail health  Good. Trace-1/2 pedal edema Vascular: Carotid, radial artery, dorsalis pedis and  posterior tibial pulses are  equal. No bruits present. Decreased pedal pulsesNeurologic: Alert and oriented x3. Deep tendon reflexes symmetrical and normal.          Skin: Intact without suspicious lesions or rashes. Lymph: No cervical, axillary lymphadenopathy present. Psych: Mood and affect are normal. Normally interactive                                                                                         Assessment & Plan:  #1 Medicare Wellness Exam; criteria  met ; data entered #2 Problem List reviewed ; Assessment/ Recommendations made  #3 hypertension; blood pressure today is good. She'll be asked to monitor her this. Goals discussed. HCTZ 12.5 mg will be added if her blood pressure stays greater than 140/90 on average.  #4 abdominal pain 09/25/11, resolved. She should be seen if this recurs or  progresses Plan: see Orders

## 2011-09-26 NOTE — Patient Instructions (Signed)
Blood Pressure Goal  Is average< 140/90,ideally  < 135/85. This AVERAGE should be calculated from @ least 5-7 BP readings taken @ different times of day on different days of week. You should not respond to isolated BP readings , but rather the AVERAGE for that week . If your blood pressure remains elevated when the swelling increases; we will add HCTZ 12.5 mg daily.  Please monitor your stools for black or tarry stools or blood. Call if abdominal pain recurs

## 2011-09-27 ENCOUNTER — Ambulatory Visit (INDEPENDENT_AMBULATORY_CARE_PROVIDER_SITE_OTHER): Payer: Medicare Other

## 2011-09-27 DIAGNOSIS — Z111 Encounter for screening for respiratory tuberculosis: Secondary | ICD-10-CM

## 2011-09-30 LAB — TB SKIN TEST: TB Skin Test: NEGATIVE

## 2011-10-14 DIAGNOSIS — Z0279 Encounter for issue of other medical certificate: Secondary | ICD-10-CM

## 2011-12-09 ENCOUNTER — Other Ambulatory Visit: Payer: Self-pay | Admitting: Internal Medicine

## 2011-12-25 ENCOUNTER — Encounter: Payer: Self-pay | Admitting: Gastroenterology

## 2012-01-06 ENCOUNTER — Encounter: Payer: Self-pay | Admitting: Gastroenterology

## 2012-01-08 ENCOUNTER — Other Ambulatory Visit: Payer: Self-pay | Admitting: Internal Medicine

## 2012-01-08 NOTE — Telephone Encounter (Signed)
Refill done.  

## 2012-01-15 ENCOUNTER — Other Ambulatory Visit: Payer: Self-pay | Admitting: Internal Medicine

## 2012-01-15 NOTE — Telephone Encounter (Signed)
Rx sent.    MW 

## 2012-02-10 ENCOUNTER — Ambulatory Visit (INDEPENDENT_AMBULATORY_CARE_PROVIDER_SITE_OTHER): Payer: Medicare Other | Admitting: Gastroenterology

## 2012-02-10 ENCOUNTER — Encounter: Payer: Self-pay | Admitting: Gastroenterology

## 2012-02-10 VITALS — BP 152/92 | HR 72 | Ht 60.75 in | Wt 102.4 lb

## 2012-02-10 DIAGNOSIS — C189 Malignant neoplasm of colon, unspecified: Secondary | ICD-10-CM

## 2012-02-10 DIAGNOSIS — R32 Unspecified urinary incontinence: Secondary | ICD-10-CM | POA: Insufficient documentation

## 2012-02-10 NOTE — Assessment & Plan Note (Signed)
Patient has no GI symptoms. Last colonoscopy in 2006 demonstrated diverticulosis. In view of her age and absence of symptoms I do not think surveillance colonoscopy is merited.

## 2012-02-10 NOTE — Patient Instructions (Addendum)
Follow up as needed

## 2012-02-10 NOTE — Assessment & Plan Note (Signed)
The patient will follow with Drs. Hopper and Grewel for this problem

## 2012-02-10 NOTE — Progress Notes (Signed)
History of Present Illness: Pleasant 78 year old white female with history of colon cancer resected in 1996 here for possible colonoscopy. Last colonoscopy 2006 demonstrated diverticulosis. She has no GI complaints including change in bowel habits, abdominal pain, melena or hematochezia.    Past Medical History  Diagnosis Date  . Hypertension   . Hyperlipidemia   . Heart murmur   . Arthritis   . AAA (abdominal aortic aneurysm)   . Varicose veins   . Anxiety   . Osteoporosis   . Cancer 1995    colon cancer  . Herpes zoster of eye 2002    Dr. Elmer Picker  . Post herpetic neuralgia   . Diverticulosis of colon   . Calculus of gallbladder without mention of cholecystitis or obstruction    Past Surgical History  Procedure Date  . Tonsillectomy   . Colon surgery     RESECTION in 1990s  . Eye surgery     CAT EXT  OU  . Endovascular stent insertion 08/08/2011    Procedure: ENDOVASCULAR STENT GRAFT INSERTION;  Surgeon: Nada Libman, MD;  Location: [facility];  Service: Vascular;  Laterality: N/A;  Endo Vascular Aortic stent placement with gore   family history includes Brain cancer in her daughter; Diabetes in her brother and sister; Heart attack in her brother; Heart attack (age of onset:65) in her father; Heart attack (age of onset:78) in her sister; and Hypertension in her father.  There is no history of Anesthesia problems, and Hypotension, and Malignant hyperthermia, and Pseudochol deficiency, . Current Outpatient Prescriptions  Medication Sig Dispense Refill  . AMBULATORY NON FORMULARY MEDICATION PRO-ADVANCED FORMULA 220 URINARY SUPPORT Take 2 by mouth at bedtime      . benazepril (LOTENSIN) 40 MG tablet TAKE 1 TABLET (40 MG TOTAL) BY MOUTH DAILY.  90 tablet  0  . calcium-vitamin D (OSCAL) 250-125 MG-UNIT per tablet Take 1 tablet by mouth daily.      . fish oil-omega-3 fatty acids 1000 MG capsule Take 1 g by mouth daily.      Marland Kitchen gabapentin (NEURONTIN) 400 MG capsule TAKE ONE CAPSULE BY  MOUTH TWICE A DAY  180 capsule  0  . metoprolol tartrate (LOPRESSOR) 25 MG tablet TAKE 1 TABLET (25 MG TOTAL) BY MOUTH 2 (TWO) TIMES DAILY.  60 tablet  0  . Polyethyl Glycol-Propyl Glycol (SYSTANE) 0.4-0.3 % SOLN Apply 1 drop to eye 2 (two) times daily.      . pravastatin (PRAVACHOL) 20 MG tablet TAKE 1 TABLET (20 MG TOTAL) BY MOUTH DAILY.  90 tablet  0   Allergies as of 02/10/2012 - Review Complete 02/10/2012  Allergen Reaction Noted  . Penicillins  11/20/2006    reports that she has never smoked. She has never used smokeless tobacco. She reports that she does not drink alcohol or use illicit drugs.     Review of Systems: She complains of urinary incontinence. Pertinent positive and negative review of systems were noted in the above HPI section. All other review of systems were otherwise negative.  Vital signs were reviewed in today's medical record Physical Exam: General: Well developed , well nourished, no acute distress Head: Normocephalic and atraumatic Eyes:  sclerae anicteric, EOMI Ears: Normal auditory acuity Mouth: No deformity or lesions Neck: Supple, no masses or thyromegaly Lungs: Clear throughout to auscultation Heart: Regular rate and rhythm; no murmurs, rubs or bruits Abdomen: Soft, non tender and non distended. No masses, hepatosplenomegaly or hernias noted. Normal Bowel sounds Rectal:deferred Musculoskeletal: Symmetrical with no  gross deformities  Skin: No lesions on visible extremities Pulses:  Normal pulses noted Extremities: No clubbing, cyanosis,  or deformities noted. There is trace pedal edema Neurological: Alert oriented x 4, grossly nonfocal Cervical Nodes:  No significant cervical adenopathy Inguinal Nodes: No significant inguinal adenopathy Psychological:  Alert and cooperative. Normal mood and affect

## 2012-02-20 ENCOUNTER — Other Ambulatory Visit: Payer: Self-pay | Admitting: Internal Medicine

## 2012-03-09 ENCOUNTER — Other Ambulatory Visit: Payer: Self-pay | Admitting: Internal Medicine

## 2012-03-09 NOTE — Telephone Encounter (Signed)
Rx sent.    MW 

## 2012-03-13 ENCOUNTER — Encounter: Payer: Self-pay | Admitting: Neurosurgery

## 2012-03-16 ENCOUNTER — Encounter: Payer: Self-pay | Admitting: Neurosurgery

## 2012-03-16 ENCOUNTER — Encounter (INDEPENDENT_AMBULATORY_CARE_PROVIDER_SITE_OTHER): Payer: Medicare Other | Admitting: *Deleted

## 2012-03-16 ENCOUNTER — Ambulatory Visit (INDEPENDENT_AMBULATORY_CARE_PROVIDER_SITE_OTHER): Payer: Medicare Other | Admitting: Neurosurgery

## 2012-03-16 VITALS — BP 155/91 | HR 70 | Resp 16 | Ht 60.0 in | Wt 120.6 lb

## 2012-03-16 DIAGNOSIS — Z48812 Encounter for surgical aftercare following surgery on the circulatory system: Secondary | ICD-10-CM

## 2012-03-16 DIAGNOSIS — I714 Abdominal aortic aneurysm, without rupture: Secondary | ICD-10-CM

## 2012-03-16 NOTE — Progress Notes (Signed)
[facility] Office Note  CC: AAA surveillance post endograft repair Referring Physician: Myra Gianotti  History of Present Illness: 78 year old female patient of Dr. Myra Gianotti status post endovascular repair of a AAA in April 2013. The patient denies any abdominal or back pain. The patient denies any new medical diagnoses or recent surgery.  Past Medical History  Diagnosis Date  . Hypertension   . Hyperlipidemia   . Heart murmur   . Arthritis   . AAA (abdominal aortic aneurysm)   . Varicose veins   . Anxiety   . Osteoporosis   . Cancer 1995    colon cancer  . Herpes zoster of eye 2002    Dr. Elmer Picker  . Post herpetic neuralgia   . Diverticulosis of colon   . Calculus of gallbladder without mention of cholecystitis or obstruction     ROS: [x]  Positive   [ ]  Denies    General: [ ]  Weight loss, [ ]  Fever, [ ]  chills Neurologic: [ ]  Dizziness, [ ]  Blackouts, [ ]  Seizure [ ]  Stroke, [ ]  "Mini stroke", [ ]  Slurred speech, [ ]  Temporary blindness; [ ]  weakness in arms or legs, [ ]  Hoarseness Cardiac: [ ]  Chest pain/pressure, [ ]  Shortness of breath at rest [ ]  Shortness of breath with exertion, [ ]  Atrial fibrillation or irregular heartbeat Vascular: [ ]  Pain in legs with walking, [ ]  Pain in legs at rest, [ ]  Pain in legs at night,  [ ]  Non-healing ulcer, [ ]  Blood clot in vein/DVT,   Pulmonary: [ ]  Home oxygen, [ ]  Productive cough, [ ]  Coughing up blood, [ ]  Asthma,  [ ]  Wheezing Musculoskeletal:  [ ]  Arthritis, [ ]  Low back pain, [ ]  Joint pain Hematologic: [ ]  Easy Bruising, [ ]  Anemia; [ ]  Hepatitis Gastrointestinal: [ ]  Blood in stool, [ ]  Gastroesophageal Reflux/heartburn, [ ]  Trouble swallowing Urinary: [ ]  chronic Kidney disease, [ ]  on HD - [ ]  MWF or [ ]  TTHS, [ ]  Burning with urination, [ ]  Difficulty urinating Skin: [ ]  Rashes, [ ]  Wounds Psychological: [ ]  Anxiety, [ ]  Depression   Social History History  Substance Use Topics  .  Smoking status: Never Smoker   . Smokeless tobacco: Never Used  . Alcohol Use: No    Family History Family History  Problem Relation Age of Onset  . Hypertension Father   . Heart attack Father 64  . Diabetes Sister   . Heart attack Sister 24     her twin  . Diabetes Brother   . Heart attack Brother     in 70s  . Anesthesia problems Neg Hx   . Hypotension Neg Hx   . Malignant hyperthermia Neg Hx   . Pseudochol deficiency Neg Hx   . Brain cancer Daughter     Allergies  Allergen Reactions  . Penicillins     Rash     Current Outpatient Prescriptions  Medication Sig Dispense Refill  . benazepril (LOTENSIN) 40 MG tablet TAKE 1 TABLET (40 MG TOTAL) BY MOUTH DAILY.  90 tablet  0  . calcium-vitamin D (OSCAL) 250-125 MG-UNIT per tablet Take 1 tablet by mouth daily.      . fish oil-omega-3 fatty acids 1000 MG capsule Take 1 g by mouth daily.      Marland Kitchen gabapentin (NEURONTIN) 400 MG capsule TAKE ONE CAPSULE BY MOUTH TWICE A DAY  180 capsule  0  . metoprolol tartrate (LOPRESSOR) 25 MG  tablet TAKE 1 TABLET (25 MG TOTAL) BY MOUTH 2 (TWO) TIMES DAILY.  60 tablet  5  . pravastatin (PRAVACHOL) 20 MG tablet TAKE 1 TABLET (20 MG TOTAL) BY MOUTH DAILY.  90 tablet  0  . AMBULATORY NON FORMULARY MEDICATION PRO-ADVANCED FORMULA 220 URINARY SUPPORT Take 2 by mouth at bedtime      . Polyethyl Glycol-Propyl Glycol (SYSTANE) 0.4-0.3 % SOLN Apply 1 drop to eye 2 (two) times daily.        Physical Examination  Filed Vitals:   03/16/12 0946  BP: 155/91  Pulse: 70  Resp: 16    Body mass index is 23.55 kg/(m^2).  General:  WDWN in NAD Gait: Normal HEENT: WNL Eyes: Pupils equal Pulmonary: normal non-labored breathing , without Rales, rhonchi,  wheezing Cardiac: RRR, without  Murmurs, rubs or gallops; No carotid bruits Abdomen: soft, NT, no masses Skin: no rashes, ulcers noted Vascular Exam/Pulses: Palpable femoral pulses bilaterally there is no abdominal mass palpated  Extremities without  ischemic changes, no Gangrene , no cellulitis; no open wounds;  Musculoskeletal: no muscle wasting or atrophy  Neurologic: A&O X 3; Appropriate Affect ; SENSATION: normal; MOTOR FUNCTION:  moving all extremities equally. Speech is fluent/normal  Non-Invasive Vascular Imaging: AAA duplex today shows AP diameter 3.3 by transverse of 3.6 which is fairly consistent with previous duplex 6 months ago.  ASSESSMENT/PLAN: Asymptomatic patient doing well status post endograft repair of AAA. Patient will followup in 6 months with a repeat duplex. The patient's questions were encouraged and answered, she is in agreement with this plan.  Lauree Chandler ANP  Clinic M.D.: Myra Gianotti

## 2012-03-16 NOTE — Addendum Note (Signed)
Addended by: Sharee Pimple on: 03/16/2012 10:39 AM   Modules accepted: Orders

## 2012-04-06 ENCOUNTER — Other Ambulatory Visit: Payer: Self-pay | Admitting: Internal Medicine

## 2012-04-06 MED ORDER — PRAVASTATIN SODIUM 20 MG PO TABS: ORAL_TABLET | ORAL | Status: DC

## 2012-04-06 MED ORDER — BENAZEPRIL HCL 40 MG PO TABS: ORAL_TABLET | ORAL | Status: DC

## 2012-04-06 NOTE — Telephone Encounter (Signed)
RXs sent.

## 2012-04-06 NOTE — Telephone Encounter (Signed)
refills x 2--last ov 6.6.13 V70 no labs -- last  labs 3.14.13  1-Benazepril HCL 40MG  Tablet #90 last fill 9.18.13, Take one tablet by mouth daily   2-Pravastatin Sodium 20MG  Tab #90 last fill 9.18.13 Take 1 tablet by mouth daily

## 2012-06-10 ENCOUNTER — Other Ambulatory Visit: Payer: Self-pay | Admitting: Internal Medicine

## 2012-06-10 NOTE — Telephone Encounter (Signed)
Patient aware Controlled Substance Contract to be sign and rx to be picked up   

## 2012-06-11 ENCOUNTER — Telehealth: Payer: Self-pay | Admitting: *Deleted

## 2012-06-11 NOTE — Telephone Encounter (Signed)
Hopp please advise  

## 2012-06-11 NOTE — Telephone Encounter (Signed)
VM left calling to check status of Paperwork dropped off for completion. Please call when ready.

## 2012-06-14 ENCOUNTER — Encounter: Payer: Self-pay | Admitting: Internal Medicine

## 2012-06-19 ENCOUNTER — Ambulatory Visit (INDEPENDENT_AMBULATORY_CARE_PROVIDER_SITE_OTHER): Payer: Medicare Other | Admitting: Internal Medicine

## 2012-06-19 ENCOUNTER — Encounter: Payer: Self-pay | Admitting: Internal Medicine

## 2012-06-19 VITALS — BP 138/94 | HR 76 | Wt 119.0 lb

## 2012-06-19 DIAGNOSIS — I10 Essential (primary) hypertension: Secondary | ICD-10-CM

## 2012-06-19 DIAGNOSIS — R1013 Epigastric pain: Secondary | ICD-10-CM

## 2012-06-19 DIAGNOSIS — R0789 Other chest pain: Secondary | ICD-10-CM

## 2012-06-19 DIAGNOSIS — T887XXA Unspecified adverse effect of drug or medicament, initial encounter: Secondary | ICD-10-CM

## 2012-06-19 LAB — CBC WITH DIFFERENTIAL/PLATELET
Eosinophils Relative: 4 % (ref 0–5)
HCT: 38.5 % (ref 36.0–46.0)
Hemoglobin: 12.6 g/dL (ref 12.0–15.0)
Lymphocytes Relative: 27 % (ref 12–46)
Lymphs Abs: 1.7 10*3/uL (ref 0.7–4.0)
MCV: 82.8 fL (ref 78.0–100.0)
Monocytes Relative: 9 % (ref 3–12)
Platelets: 176 10*3/uL (ref 150–400)
RBC: 4.65 MIL/uL (ref 3.87–5.11)
WBC: 6.2 10*3/uL (ref 4.0–10.5)

## 2012-06-19 LAB — BASIC METABOLIC PANEL
CO2: 34 mEq/L — ABNORMAL HIGH (ref 19–32)
Chloride: 100 mEq/L (ref 96–112)
Creat: 1.1 mg/dL (ref 0.50–1.10)

## 2012-06-19 MED ORDER — LOSARTAN POTASSIUM 100 MG PO TABS: ORAL_TABLET | ORAL | Status: DC

## 2012-06-19 NOTE — Progress Notes (Signed)
  Subjective:    Patient ID: Rebecca Knight, female    DOB: June 01, 1922, 79 y.o.   MRN: [id number]  HPI CHRONIC HYPERTENSION follow-up:  Home blood pressure range up to 164/100  Patient is compliant with medications  Adverse effects noted from medication: dry throat (Note: on ACE-I)  No exercise program but helping with daughter  Low-sugar/ low-salt diet           Review of Systems No exertional chest pain, palpitations, dyspnea, claudication,or paroxysmal nocturnal dyspnea described. Occasional L sided sharp chest pain @ rest over 1 minute. No sweating or nausea associated. Persistent edema.No dyspepsia, dysphagia, weight loss, melena or rectal bleeding. Mid abd pain on one occasion. Stools ?dark & soft; increased flatulence  No significant lightheadedness, headache, epistaxis, or syncope. Nocturia 3x / night.      Objective:   Physical Exam Thin but appears healthy and well-nourished & in no acute distress.Appears younger than stated age  No carotid bruits are present.No neck pain distention present at 10 - 15 degrees. Thyroid normal to palpation  Heart rhythm and rate are normal with Grade 1.5 /6 systolic murmur  Chest is clear with no increased work of breathing  There is no evidence of aortic aneurysm or renal artery bruits. L carotid bruit  Abdomen soft with no organomegaly or masses. No HJR  No clubbing, cyanosis or edema present.  DPP decreased > PTP  No ischemic skin changes are present .   Alert and oriented. Strength, tone, DTRs reflexes normal         Assessment & Plan:  #1 HTN #2 dry throat, ? ACE-I induced #3 atypical chest pain #4 abd pain #5 stool changes Plan: See orders and recommendations

## 2012-06-19 NOTE — Patient Instructions (Addendum)
Please complete and return stool cards; these will determine whether there is any gastrointestinal bleeding risk. Review and correct the record as indicated. Please share record with all medical staff seen.

## 2012-06-25 NOTE — Telephone Encounter (Signed)
Amy please verify if you have received this paperwork

## 2012-06-25 NOTE — Telephone Encounter (Signed)
Hopp please advise, I checked green file folder and no paperwork/form for patient

## 2012-06-25 NOTE — Telephone Encounter (Signed)
Last form signed & placed in Rebecca Knight's basket ; not sure of date

## 2012-07-02 ENCOUNTER — Encounter: Payer: Self-pay | Admitting: Internal Medicine

## 2012-07-12 ENCOUNTER — Other Ambulatory Visit: Payer: Self-pay | Admitting: Internal Medicine

## 2012-07-13 NOTE — Telephone Encounter (Signed)
Pending CPX 09/2012

## 2012-07-14 ENCOUNTER — Other Ambulatory Visit (INDEPENDENT_AMBULATORY_CARE_PROVIDER_SITE_OTHER): Payer: Medicare Other

## 2012-07-14 DIAGNOSIS — Z1289 Encounter for screening for malignant neoplasm of other sites: Secondary | ICD-10-CM

## 2012-07-14 LAB — HEMOCCULT GUIAC POC 1CARD (OFFICE): Card #2 Fecal Occult Blod, POC: NEGATIVE

## 2012-07-27 NOTE — Telephone Encounter (Signed)
Patient was seen on 06/19/2012

## 2012-08-15 ENCOUNTER — Other Ambulatory Visit: Payer: Self-pay | Admitting: Internal Medicine

## 2012-08-17 ENCOUNTER — Other Ambulatory Visit: Payer: Self-pay | Admitting: Internal Medicine

## 2012-08-18 NOTE — Telephone Encounter (Signed)
Spoke with pharmacy, pt still has refills left on file. Refill request sent in error.

## 2012-08-26 ENCOUNTER — Telehealth: Payer: Self-pay | Admitting: Internal Medicine

## 2012-08-26 NOTE — Telephone Encounter (Signed)
[facility] called to inquire about the [place]illa envelope they dropped off a few weeks ago. Contents were FL2 forms to be filled out by Dr. Alwyn Ren. Has anyone seen this paperwork?

## 2012-08-26 NOTE — Telephone Encounter (Signed)
Paper work is not in [employer], contracting, Hopp please advise if you have seen FL2 forms

## 2012-08-28 NOTE — Telephone Encounter (Signed)
Paperwork is scanned into the system, I called [facility] and informed them paperwork was sent to them.

## 2012-09-07 ENCOUNTER — Other Ambulatory Visit: Payer: Self-pay | Admitting: Internal Medicine

## 2012-09-18 ENCOUNTER — Encounter: Payer: Self-pay | Admitting: Neurosurgery

## 2012-09-21 ENCOUNTER — Ambulatory Visit: Payer: Medicare Other | Admitting: Neurosurgery

## 2012-09-21 ENCOUNTER — Telehealth: Payer: Self-pay | Admitting: Surgery

## 2012-09-21 ENCOUNTER — Other Ambulatory Visit: Payer: Medicare Other

## 2012-09-21 NOTE — Telephone Encounter (Signed)
Patient reports "occasional heart pain", the same as when she saw Dr. Alwyn Ren in February. She will call them to discuss this pain and will reschedule her protocol scan for post AAA. Rebecca Knight was made aware to reschedule.

## 2012-09-21 NOTE — Telephone Encounter (Signed)
Patient missed appt today and wanted to reschedule, I noticed she was Lab only, but she mentioned having some pain- transferred to Triage for appropriate follow up.

## 2012-09-22 ENCOUNTER — Other Ambulatory Visit: Payer: Self-pay | Admitting: *Deleted

## 2012-09-22 DIAGNOSIS — I714 Abdominal aortic aneurysm, without rupture: Secondary | ICD-10-CM

## 2012-09-22 DIAGNOSIS — Z48812 Encounter for surgical aftercare following surgery on the circulatory system: Secondary | ICD-10-CM

## 2012-09-23 ENCOUNTER — Ambulatory Visit (INDEPENDENT_AMBULATORY_CARE_PROVIDER_SITE_OTHER): Payer: Medicare Other | Admitting: Vascular Surgery

## 2012-09-23 DIAGNOSIS — I714 Abdominal aortic aneurysm, without rupture, unspecified: Secondary | ICD-10-CM

## 2012-09-23 DIAGNOSIS — Z48812 Encounter for surgical aftercare following surgery on the circulatory system: Secondary | ICD-10-CM

## 2012-09-23 NOTE — Progress Notes (Signed)
AAA ultrasound performed @ [facility] 09/23/2012

## 2012-09-28 ENCOUNTER — Other Ambulatory Visit: Payer: Self-pay

## 2012-09-28 ENCOUNTER — Ambulatory Visit (INDEPENDENT_AMBULATORY_CARE_PROVIDER_SITE_OTHER): Payer: Medicare Other | Admitting: Internal Medicine

## 2012-09-28 ENCOUNTER — Encounter: Payer: Self-pay | Admitting: Internal Medicine

## 2012-09-28 VITALS — BP 150/88 | HR 79 | Temp 98.3°F | Resp 12 | Ht 61.08 in | Wt 123.0 lb

## 2012-09-28 DIAGNOSIS — R0789 Other chest pain: Secondary | ICD-10-CM

## 2012-09-28 DIAGNOSIS — I10 Essential (primary) hypertension: Secondary | ICD-10-CM

## 2012-09-28 DIAGNOSIS — I714 Abdominal aortic aneurysm, without rupture: Secondary | ICD-10-CM

## 2012-09-28 DIAGNOSIS — E782 Mixed hyperlipidemia: Secondary | ICD-10-CM

## 2012-09-28 DIAGNOSIS — Z0000 Encounter for general adult medical examination without abnormal findings: Secondary | ICD-10-CM

## 2012-09-28 DIAGNOSIS — Z48812 Encounter for surgical aftercare following surgery on the circulatory system: Secondary | ICD-10-CM

## 2012-09-28 MED ORDER — METOPROLOL TARTRATE 50 MG PO TABS: ORAL_TABLET | ORAL | Status: DC

## 2012-09-28 NOTE — Progress Notes (Signed)
Subjective:    Patient ID: Rebecca Knight, female    DOB: [birth year], 79 y.o.   MRN: [id number]  HPI Medicare Wellness Visit:  Psychosocial & medical history were reviewed as required by Medicare (abuse,antisocial behavioral risks,firearm risk).  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  Home & personal  safety / fall risk:some unsteadiness @ times Limitations of activities of daily living:no Seatbelt  and smoke alarm use:yes Power of Attorney/Living Will status : needed Ophthalmology exam status : due Hearing evaluation status:1 year ago Orientation :oriented X 3 Memory & recall :good Math testing: good Active depression / anxiety:denied Foreign travel history : remotely in 29s in [place] Immunization status for Shingles /Flu/ PNA/ tetanus :afraid to take shingles Transfusion history: no  Preventive health surveillance status of colonoscopy, BMD , mammograms,PAP as per protocol/ SOC:BMD by Gyn; aged out of colonoscopy as per GI Dental care: not seen  Chart reviewed &  Updated. Active issues reviewed & addressed.      Review of Systems CHRONIC HYPERTENSION follow-up: Home blood pressure not monitored regularly Patient is compliant with medications No adverse effects noted from medication No specific dietary program but minimal salt in diet.   She describes intermittent, nonexertional substernal sharp chest pain lasting seconds. Resolves with several deep inspirations. Last episode this am.She denies associated dyspepsia,dysphagia, abdominal pain, melena, rectal bleeding.     No  palpitations, dyspnea, claudication,or paroxysmal nocturnal dyspnea described. No significant lightheadedness, headache, epistaxis, or syncope. Occasional benign positional vertigo.         Objective:   Physical Exam  Gen.: Healthy and well-nourished in appearance. Alert, appropriate and cooperative throughout  exam. Appears younger than stated age  Head: Normocephalic without obvious abnormalities  Eyes: No corneal or conjunctival inflammation noted.  Extraocular motion intact. Vision grossly normal with lenses Ears: External  ear exam reveals no significant lesions or deformities. Some wax on R; butTMs normal. Hearing is grossly markedly decreased bilaterally. Nose: External nasal exam reveals no deformity or inflammation. Nasal mucosa are pink and moist. No lesions or exudates noted.  Mouth: Oral mucosa and oropharynx reveal no lesions or exudates. Teeth in poor repair.Osteoma R posterior maxilla Neck: No deformities, masses, or tenderness noted. Range of motion good .Thyroid normal. Lungs: Normal respiratory effort; chest expands symmetrically. Lungs are clear to auscultation without rales, wheezes, or increased work of breathing. Heart: Normal rate and rhythm. Normal S1 ;accentuated S2. No gallop, click, or rub. Grade 1/6 systolic murmur R base Abdomen: Bowel sounds normal; abdomen soft and nontender. No masses, organomegaly or hernias noted. Aorta palpable ; known AAA Genitalia: As per Gyn  , Dr Vincente Poli                                Musculoskeletal/extremities: There is some asymmetry of the posterior thoracic musculature suggesting occult scoliosis. No clubbing, cyanosis,  or significant extremity  deformity noted. Range of motion normal .Tone & strength  Normal. Trace - 1/2 + edema Joints  reveal mild  DJD DIP changes. Nail health good. Able to lie down & sit up w/o help. Negative SLR bilaterally Vascular: Carotid, radial artery, & dorsalis pedis  are full and equal. No bruits present.Decreased PTP Neurologic: Alert and oriented x3. Deep tendon reflexes symmetrical and normal.       Skin: Intact without suspicious lesions or rashes. Lymph: No  cervical, axillary lymphadenopathy present. Psych: Mood and affect are normal. Normally interactive                                                                                       Assessment & Plan:  #1 Medicare Wellness Exam; criteria met ; data entered #2 Problem List/Diagnoses reviewed #3 non exertional atypical chest pain relieved with deep inspirations. EKG normal Plan:  Assessments made/ Orders entered

## 2012-09-28 NOTE — Patient Instructions (Addendum)
Minimal Blood Pressure Goal= AVERAGE < 140/90;  Ideal is an AVERAGE < 135/85. This AVERAGE should be calculated from @ least 5-7 BP readings taken @ different times of day on different days of week. You should not respond to isolated BP readings , but rather the AVERAGE for that week .Please bring your  blood pressure cuff to office visits to verify that it is reliable.It  can also be checked against the blood pressure device at the pharmacy. Finger or wrist cuffs are not dependable; an arm cuff is. Share results with [facility] staff .

## 2012-10-10 ENCOUNTER — Other Ambulatory Visit: Payer: Self-pay | Admitting: Internal Medicine

## 2012-10-10 DIAGNOSIS — T887XXA Unspecified adverse effect of drug or medicament, initial encounter: Secondary | ICD-10-CM

## 2012-10-10 DIAGNOSIS — E785 Hyperlipidemia, unspecified: Secondary | ICD-10-CM

## 2012-10-10 NOTE — Telephone Encounter (Signed)
#  90.Please  schedule fasting Labs : CK,Lipids, AST, ALT, TSH. Code 272.4, 995.20

## 2012-10-10 NOTE — Telephone Encounter (Signed)
Last OV 09-28-12, Last refilled3-24-14 #90 last lipid 07-04-11.Please advise

## 2012-10-12 NOTE — Telephone Encounter (Signed)
Discuss with patient, appt scheduled. 

## 2012-10-13 ENCOUNTER — Other Ambulatory Visit: Payer: Self-pay

## 2012-10-13 DIAGNOSIS — I714 Abdominal aortic aneurysm, without rupture: Secondary | ICD-10-CM

## 2012-10-13 DIAGNOSIS — Z48812 Encounter for surgical aftercare following surgery on the circulatory system: Secondary | ICD-10-CM

## 2012-10-19 ENCOUNTER — Telehealth: Payer: Self-pay

## 2012-10-19 NOTE — Telephone Encounter (Signed)
Called patient to give her results of aortic ultrasound.  No change since prior study per Dr. Myra Gianotti.  Results are normal.  Patient needs to return for aortic ultrasound and to see Dr. Myra Gianotti in 1 year.  Left message for patient to call Eber Jones for results.  Also need to tell patient that we will send results letter and appointment for 1 year.

## 2012-10-20 ENCOUNTER — Other Ambulatory Visit (INDEPENDENT_AMBULATORY_CARE_PROVIDER_SITE_OTHER): Payer: Medicare Other

## 2012-10-20 DIAGNOSIS — T887XXA Unspecified adverse effect of drug or medicament, initial encounter: Secondary | ICD-10-CM

## 2012-10-20 DIAGNOSIS — E785 Hyperlipidemia, unspecified: Secondary | ICD-10-CM

## 2012-10-20 LAB — LIPID PANEL
Cholesterol: 149 mg/dL (ref 0–200)
LDL Cholesterol: 77 mg/dL (ref 0–99)
Triglycerides: 126 mg/dL (ref 0.0–149.0)
VLDL: 25.2 mg/dL (ref 0.0–40.0)

## 2012-10-21 ENCOUNTER — Encounter: Payer: Self-pay | Admitting: *Deleted

## 2012-10-21 ENCOUNTER — Telehealth: Payer: Self-pay

## 2012-10-21 NOTE — Telephone Encounter (Signed)
I called Rebecca Knight with the results of an aortic ultrasound she had at [facility] in June.  We have left several messages for each other, but I finally spoke with her today.  I told her the results of the ultrasound, unchanged from the previous.  I also told her that Dr. Myra Gianotti wants the ultrasound repeated in 1 year.  She will receive a results letter and an appointment to return for the next ultrasound in 1 year.

## 2012-10-22 ENCOUNTER — Encounter: Payer: Self-pay | Admitting: Vascular Surgery

## 2012-11-11 NOTE — Telephone Encounter (Signed)
This encounter was created in error - please disregard.

## 2012-12-12 ENCOUNTER — Other Ambulatory Visit: Payer: Self-pay | Admitting: Internal Medicine

## 2012-12-16 NOTE — Telephone Encounter (Signed)
Med filled.  

## 2013-01-07 ENCOUNTER — Other Ambulatory Visit: Payer: Self-pay | Admitting: Internal Medicine

## 2013-01-13 NOTE — Telephone Encounter (Signed)
Med filled.  

## 2013-03-10 ENCOUNTER — Other Ambulatory Visit: Payer: Self-pay | Admitting: Internal Medicine

## 2013-03-11 NOTE — Telephone Encounter (Signed)
Request for Gabapentin 400mg  Last refill:12-16-12 Last OV:09-28-12 Please advise.//AB/CMA

## 2013-03-11 NOTE — Telephone Encounter (Signed)
OK X1 

## 2013-04-01 ENCOUNTER — Other Ambulatory Visit: Payer: Self-pay | Admitting: Internal Medicine

## 2013-04-02 NOTE — Telephone Encounter (Signed)
Rx sent to the pharmacy by e-script.  Pt needs office visit for BP check.//AB/CMA 

## 2013-04-14 ENCOUNTER — Other Ambulatory Visit: Payer: Self-pay | Admitting: *Deleted

## 2013-04-14 MED ORDER — PRAVASTATIN SODIUM 20 MG PO TABS: ORAL_TABLET | ORAL | Status: DC

## 2013-06-07 ENCOUNTER — Other Ambulatory Visit: Payer: Self-pay | Admitting: Internal Medicine

## 2013-06-09 ENCOUNTER — Encounter: Payer: Self-pay | Admitting: Internal Medicine

## 2013-06-09 NOTE — Telephone Encounter (Signed)
OK X1 

## 2013-06-09 NOTE — Telephone Encounter (Signed)
Requesting Gabapentin 400mg  Take 1 capsule twice a day. Last refill:03-10-13:#180 Last OV:09-28-12 Please advise.//AB/CMA      Rx sent to the pharmacy for Losartan 100mg  by e-script.//AB/CMA

## 2013-06-10 NOTE — Telephone Encounter (Signed)
Rx sent to the pharmacy.//AB/CMA 

## 2013-07-08 ENCOUNTER — Other Ambulatory Visit: Payer: Self-pay | Admitting: *Deleted

## 2013-07-08 MED ORDER — METOPROLOL TARTRATE 50 MG PO TABS: ORAL_TABLET | ORAL | Status: DC

## 2013-07-08 NOTE — Telephone Encounter (Signed)
Rx sent to the pharmacy by e-script.  Pt needs complete physical and fasting labs.//AB/CMA 

## 2013-09-04 ENCOUNTER — Other Ambulatory Visit: Payer: Self-pay | Admitting: Internal Medicine

## 2013-10-04 ENCOUNTER — Other Ambulatory Visit: Payer: Self-pay

## 2013-10-04 MED ORDER — PRAVASTATIN SODIUM 20 MG PO TABS: ORAL_TABLET | ORAL | Status: AC

## 2013-10-04 MED ORDER — METOPROLOL TARTRATE 50 MG PO TABS: ORAL_TABLET | ORAL | Status: DC

## 2013-10-25 ENCOUNTER — Other Ambulatory Visit (HOSPITAL_COMMUNITY): Payer: Medicare Other

## 2013-10-25 ENCOUNTER — Ambulatory Visit: Payer: Medicare Other | Admitting: Family

## 2013-11-05 ENCOUNTER — Encounter: Payer: Self-pay | Admitting: Family

## 2013-11-08 ENCOUNTER — Other Ambulatory Visit: Payer: Self-pay | Admitting: Surgery

## 2013-11-08 ENCOUNTER — Ambulatory Visit (HOSPITAL_COMMUNITY)
Admission: RE | Admit: 2013-11-08 | Discharge: 2013-11-08 | Disposition: A | Discharge status: Home / Self Care | Payer: Medicare Other | Source: Ambulatory Visit | Attending: Family | Admitting: Family

## 2013-11-08 ENCOUNTER — Ambulatory Visit (INDEPENDENT_AMBULATORY_CARE_PROVIDER_SITE_OTHER): Payer: Medicare Other | Admitting: Family

## 2013-11-08 ENCOUNTER — Encounter: Payer: Self-pay | Admitting: Family

## 2013-11-08 VITALS — BP 160/95 | HR 74 | Resp 16 | Ht 62.0 in | Wt 126.0 lb

## 2013-11-08 DIAGNOSIS — I714 Abdominal aortic aneurysm, without rupture, unspecified: Secondary | ICD-10-CM | POA: Insufficient documentation

## 2013-11-08 DIAGNOSIS — Z48812 Encounter for surgical aftercare following surgery on the circulatory system: Secondary | ICD-10-CM

## 2013-11-08 LAB — CREATININE, SERUM: CREATININE: 0.85 mg/dL (ref 0.50–1.10)

## 2013-11-08 LAB — BUN: BUN: 9 mg/dL (ref 6–23)

## 2013-11-08 NOTE — Progress Notes (Signed)
[facility] [place]  Established EVAR  History of Present Illness  Rebecca Knight is a 80 y.o. ([birth year]) female patient of Dr. [name] status post endovascular repair of a AAA in April 2013 who presents for routine follow up.  Most recent EVAR duplex (Date: 09/23/12) demonstrates: 3.53 sac size.  Most recent CTA (Date: 09/09/11) demonstrates: no endoleak and 4.8 sac size.  The patient denies back or abdominal pain. Pt denies allergy to IV dye, denies kidney problems. She does report urinary incontinence.  Pt denies history of stroke or TIA. She reports a tired feeling in one or both calves after walking "for a while".  She does not take any antiplatelet nor anticoagulant medication, she does take a daily statin.    Pt Diabetic: No Pt smoker: non-smoker   Past Medical History  Diagnosis Date  . Hypertension   . Hyperlipidemia   . Heart murmur   . Arthritis   . AAA (abdominal aortic aneurysm)     [name]  . Varicose veins   . Anxiety   . Osteoporosis   . History of colon cancer 1995    Dr Lennie Hummer  . Herpes zoster of eye 2002    Dr. Herbert Deaner  . Post herpetic neuralgia   . Diverticulosis of colon   . Calculus of gallbladder without mention of cholecystitis or obstruction   . Cancer     Colon and Rectal   Past Surgical History  Procedure Laterality Date  . Tonsillectomy    . Colon surgery      RESECTION in 1990s  . Eye surgery      CAT EXT  OU  . Endovascular stent insertion  08/08/2011    Procedure: ENDOVASCULAR STENT GRAFT INSERTION;  Surgeon: [name], MD;  Location: [facility] OR;  Service: Vascular;  Laterality: N/A;  Endo Vascular Aortic stent placement with gore   Social History History  Substance Use Topics  . Smoking status: Never Smoker   . Smokeless tobacco: Never Used  . Alcohol Use: No   Family History Family History  Problem Relation Age of Onset  . Hypertension Father   . Heart attack Father 67  . Diabetes Sister   . Heart  attack Sister 58     her twin  . Diabetes Brother   . Heart attack Brother     in 50s  . Anesthesia problems Neg Hx   . Hypotension Neg Hx   . Malignant hyperthermia Neg Hx   . Pseudochol deficiency Neg Hx   . Brain cancer Daughter   . Stroke Mother     > 3   Current Outpatient Prescriptions on File Prior to Visit  Medication Sig Dispense Refill  . calcium-vitamin D (OSCAL) 250-125 MG-UNIT per tablet Take 1 tablet by mouth daily.      . Cholecalciferol (VITAMIN D3) 1000 UNITS CAPS Take by mouth daily.      . fish oil-omega-3 fatty acids 1000 MG capsule Take 1 g by mouth daily.      Marland Kitchen gabapentin (NEURONTIN) 400 MG capsule TAKE ONE CAPSULE TWICE DAILY  180 capsule  0  . losartan (COZAAR) 100 MG tablet TAKE 1 TABLET BY MOUTH EVERY DAY--REPLACING BENAZEPRIL  90 tablet  3  . metoprolol (LOPRESSOR) 50 MG tablet PT NEEDS COMPLETE PHYSICAL AND FASTING LABS.  TAKE 1 TABLET BY MOUTH TWICE A DAY.  180 tablet  0  . Polyethyl Glycol-Propyl Glycol (SYSTANE) 0.4-0.3 % SOLN Apply 1 drop to eye  2 (two) times daily.      . pravastatin (PRAVACHOL) 20 MG tablet 1 BY MOUTH DAILY  90 tablet  1   No current facility-administered medications on file prior to visit.   Allergies  Allergen Reactions  . Penicillins     Rash      ROS: See HPI for pertinent positives and negatives.  Physical Examination  Filed Vitals:   11/08/13 0937  BP: 160/95  Pulse: 74  Resp: 16  Height: 5\' 2"  (1.575 m)  Weight: 126 lb (57.153 kg)  SpO2: 99%   Body mass index is 23.04 kg/(m^2).  General: A&O x 3, WD.  Pulmonary: Sym exp, good air movt, CTAB, no rales, rhonchi, or wheezing.  Cardiac: RRR, Nl S1, S2, positive Murmurs  Vascular: Vessel Right Left  Radial 2+Palpable 2+Palpable  Carotid  without bruit without bruit  Aorta  palpable N/A  Femoral 2+Palpable 2+Palpable  Popliteal 2+ palpable 2+ palpable  PT Not Palpable Not Palpable  DP Faintly Palpable 2+Palpable   Gastrointestinal: soft, NTND, -G/R,  - HSM, - masses, - CVATB.  Musculoskeletal: M/S 4/5 throughout, Extremities without ischemic changes.  Neurologic: Pain and light touch intact in extremities, Motor exam as listed above. Is hard of hearing.  Non-Invasive Vascular Imaging  EVAR Duplex (Date: 11/08/2013) ABDOMINAL AORTA DUPLEX EVALUATION - POST ENDOVASCULAR REPAIR    INDICATION: Follow up endograft.    PREVIOUS INTERVENTION(S): Endovascular repair of the abdominal aortic aneurysm 08/08/2011    DUPLEX EXAM:      DIAMETER AP (cm) DIAMETER TRANSVERSE (cm) VELOCITIES (cm/sec)  Aorta See impression    Right Common Iliac     Left Common Iliac       Comparison Study       Date DIAMETER AP (cm) DIAMETER TRANSVERSE (cm)  09/23/12 3.24 cm 3.53 cm     ADDITIONAL FINDINGS:     IMPRESSION: 1. The Aorta/endograft appear patent, however the proximal edge of the endograft is not fully attached with soft thrombus visualized. 2. The aorta proximal fixation site measures 2.8 cms (20cm). 3. The mid  aorta measures just distal to the endograft 3.2 x cms 4. The distal aorta measures 3.0 x 3.6 cms. 5. The right limb measures 1.1 x 1.1 cms and the left limb measures 1.10 x 1.14.    Compared to the previous exam:      Medical Decision Making  Rebecca Knight is a 80 y.o. female who presents s/p EVAR.  Pt is asymptomatic with no change in sac size.  The Aorta/endograft appears patent, however the proximal edge of the endograft is not fully attached with soft thrombus visualized. She states she does not have a PCP but will make an appointment with Dr. Rachell Cipro in [place], recommended by her gynecologist; pt advised to make an appointment with her today to be seen ASAP re her uncontrolled hypertension.   I discussed with the patient the importance of surveillance of the endograft.  Based on today's AAA Duplex EVAR evaluation and after discussing with Dr. Oneida Alar, the next CTA will be scheduled for 1-2 weeks.  The patient  will follow up with Dr. [name] on November 22, 2013.  I emphasized the importance of maximal medical management including strict control of blood pressure, blood glucose, and lipid levels, antiplatelet agents, obtaining regular exercise, and cessation of smoking.   The patient was given information about AAA including signs, symptoms, treatment, and how to minimize the risk of enlargement and rupture of aneurysms.  Thank you for allowing [place] to participate in this patient's care.  Clemon Chambers, RN, MSN, FNP-C [facility] [place] Office: [phone]  Clinic Physician: Oneida Alar on call  11/08/2013, 9:48 AM

## 2013-11-08 NOTE — Patient Instructions (Signed)
Abdominal Aortic Aneurysm An aneurysm is a weakened or damaged part of an artery wall that bulges from the normal force of blood pumping through the body. An abdominal aortic aneurysm is an aneurysm that occurs in the lower part of the aorta, the main artery of the body.  The major concern with an abdominal aortic aneurysm is that it can enlarge and burst (rupture) or blood can flow between the layers of the wall of the aorta through a tear (aorticdissection). Both of these conditions can cause bleeding inside the body and can be life threatening unless diagnosed and treated promptly. CAUSES  The exact cause of an abdominal aortic aneurysm is unknown. Some contributing factors are:   A hardening of the arteries caused by the buildup of fat and other substances in the lining of a blood vessel (arteriosclerosis).  Inflammation of the walls of an artery (arteritis).   Connective tissue diseases, such as Marfan syndrome.   Abdominal trauma.   An infection, such as syphilis or staphylococcus, in the wall of the aorta (infectious aortitis) caused by bacteria. RISK FACTORS  Risk factors that contribute to an abdominal aortic aneurysm may include:  Age older than 60 years.   High blood pressure (hypertension).  Female gender.  Ethnicity (white race).  Obesity.  Family history of aneurysm (first degree relatives only).  Tobacco use. PREVENTION  The following healthy lifestyle habits may help decrease your risk of abdominal aortic aneurysm:  Quitting smoking. Smoking can raise your blood pressure and cause arteriosclerosis.  Limiting or avoiding alcohol.  Keeping your blood pressure, blood sugar level, and cholesterol levels within normal limits.  Decreasing your salt intake. In somepeople, too much salt can raise blood pressure and increase your risk of abdominal aortic aneurysm.  Eating a diet low in saturated fats and cholesterol.  Increasing your fiber intake by including  whole grains, vegetables, and fruits in your diet. Eating these foods may help lower blood pressure.  Maintaining a healthy weight.  Staying physically active and exercising regularly. SYMPTOMS  The symptoms of abdominal aortic aneurysm may vary depending on the size and rate of growth of the aneurysm.Most grow slowly and do not have any symptoms. When symptoms do occur, they may include:  Pain (abdomen, side, lower back, or groin). The pain may vary in intensity. A sudden onset of severe pain may indicate that the aneurysm has ruptured.  Feeling full after eating only small amounts of food.  Nausea or vomiting or both.  Feeling a pulsating lump in the abdomen.  Feeling faint or passing out. DIAGNOSIS  Since most unruptured abdominal aortic aneurysms have no symptoms, they are often discovered during diagnostic exams for other conditions. An aneurysm may be found during the following procedures:  Ultrasonography (A one-time screening for abdominal aortic aneurysm by ultrasonography is also recommended for all men aged 65-75 years who have ever smoked).  X-ray exams.  A computed tomography (CT).  Magnetic resonance imaging (MRI).  Angiography or arteriography. TREATMENT  Treatment of an abdominal aortic aneurysm depends on the size of your aneurysm, your age, and risk factors for rupture. Medication to control blood pressure and pain may be used to manage aneurysms smaller than 6 cm. Regular monitoring for enlargement may be recommended by your caregiver if:  The aneurysm is 3-4 cm in size (an annual ultrasonography may be recommended).  The aneurysm is 4-4.5 cm in size (an ultrasonography every 6 months may be recommended).  The aneurysm is larger than 4.5 cm in   size (your caregiver may ask that you be examined by a vascular surgeon). If your aneurysm is larger than 6 cm, surgical repair may be recommended. There are two main methods for repair of an aneurysm:   Endovascular  repair (a minimally invasive surgery). This is done most often.  Open repair. This method is used if an endovascular repair is not possible. Document Released: 01/16/2005 Document Revised: 08/03/2012 Document Reviewed: 05/08/2012 ExitCare Patient Information 2015 [employer]. This information is not intended to replace advice given to you by your health care provider. Make sure you discuss any questions you have with your health care provider.  

## 2013-11-19 ENCOUNTER — Encounter: Payer: Self-pay | Admitting: Surgery

## 2013-11-22 ENCOUNTER — Encounter: Payer: Self-pay | Admitting: Surgery

## 2013-11-22 ENCOUNTER — Ambulatory Visit (INDEPENDENT_AMBULATORY_CARE_PROVIDER_SITE_OTHER): Payer: Medicare Other | Admitting: Surgery

## 2013-11-22 ENCOUNTER — Ambulatory Visit
Admission: RE | Admit: 2013-11-22 | Discharge: 2013-11-22 | Disposition: A | Discharge status: Home / Self Care | Payer: Medicare Other | Source: Ambulatory Visit | Attending: Family | Admitting: Family

## 2013-11-22 VITALS — BP 178/100 | HR 78 | Temp 97.8°F | Resp 16 | Ht 60.0 in | Wt 124.0 lb

## 2013-11-22 DIAGNOSIS — I714 Abdominal aortic aneurysm, without rupture, unspecified: Secondary | ICD-10-CM

## 2013-11-22 DIAGNOSIS — Z48812 Encounter for surgical aftercare following surgery on the circulatory system: Secondary | ICD-10-CM

## 2013-11-22 MED ORDER — IOHEXOL 350 MG/ML SOLN
80.0000 mL | Freq: Once | INTRAVENOUS | Status: AC | PRN
  Administered 2013-11-22: 80 mL via INTRAVENOUS

## 2013-11-22 NOTE — Addendum Note (Signed)
Addended by: Mena Goes on: 11/22/2013 04:32 PM   Modules accepted: Orders

## 2013-11-22 NOTE — Progress Notes (Signed)
Patient name: Rebecca Knight MRN: [id number] DOB: 1922/09/13 Sex: female     Chief Complaint  Patient presents with  . Follow-up    EVAR 08-08-2011   CTA done today    HISTORY OF PRESENT ILLNESS: This is an 80 year old female who is status post endovascular aneurysm repair, performed on 08/08/2011.  Indication for repair was a significant increase in the size of her 6 month period.  Her procedure was done using local anesthesia and IV sedation.  It was uncomplicated.  She was here for routine followup several weeks ago an ultrasound identified that the stent graft was not fully opposed proximally.  Aneurysm continue to decrease in size.  A CT scan was ordered.  She is here today for followup.  Past Medical History  Diagnosis Date  . Hypertension   . Hyperlipidemia   . Heart murmur   . Arthritis   . AAA (abdominal aortic aneurysm)     [name]  . Varicose veins   . Anxiety   . Osteoporosis   . History of colon cancer 1995    Dr Lennie Hummer  . Herpes zoster of eye 2002    Dr. Herbert Deaner  . Post herpetic neuralgia   . Diverticulosis of colon   . Calculus of gallbladder without mention of cholecystitis or obstruction   . Cancer     Colon and Rectal    Past Surgical History  Procedure Laterality Date  . Tonsillectomy    . Colon surgery      RESECTION in 1990s  . Eye surgery      CAT EXT  OU  . Endovascular stent insertion  08/08/2011    Procedure: ENDOVASCULAR STENT GRAFT INSERTION;  Surgeon: [name], MD;  Location: [facility] OR;  Service: Vascular;  Laterality: N/A;  Endo Vascular Aortic stent placement with gore    History   Social History  . Marital Status: Widowed    Spouse Name: N/A    Number of Children: 2  . Years of Education: N/A   Occupational History  . retired    Social History Main Topics  . Smoking status: Never Smoker   . Smokeless tobacco: Never Used  . Alcohol Use: No  . Drug Use: No  . Sexual Activity: Not on file   Other Topics Concern  .  Not on file   Social History Narrative  . No narrative on file    Family History  Problem Relation Age of Onset  . Hypertension Father   . Heart attack Father 34  . Diabetes Sister   . Heart attack Sister 71     her twin  . Diabetes Brother   . Heart attack Brother     in 90s  . Anesthesia problems Neg Hx   . Hypotension Neg Hx   . Malignant hyperthermia Neg Hx   . Pseudochol deficiency Neg Hx   . Brain cancer Daughter   . Stroke Mother     > 65    Allergies as of 11/22/2013 - Review Complete 11/22/2013  Allergen Reaction Noted  . Penicillins  11/20/2006    Current Outpatient Prescriptions on File Prior to Visit  Medication Sig Dispense Refill  . calcium-vitamin D (OSCAL) 250-125 MG-UNIT per tablet Take 1 tablet by mouth daily.      . Carboxymethylcellulose Sodium (LUBRICANT EYE DROPS OP) Apply to eye daily.      . Cholecalciferol (VITAMIN D3) 1000 UNITS CAPS Take by mouth daily.      Marland Kitchen  fish oil-omega-3 fatty acids 1000 MG capsule Take 1 g by mouth daily.      Marland Kitchen gabapentin (NEURONTIN) 400 MG capsule TAKE ONE CAPSULE TWICE DAILY  180 capsule  0  . losartan (COZAAR) 100 MG tablet TAKE 1 TABLET BY MOUTH EVERY DAY--REPLACING BENAZEPRIL  90 tablet  3  . metoprolol (LOPRESSOR) 50 MG tablet PT NEEDS COMPLETE PHYSICAL AND FASTING LABS.  TAKE 1 TABLET BY MOUTH TWICE A DAY.  180 tablet  0  . mirabegron ER (MYRBETRIQ) 25 MG TB24 tablet Take 25 mg by mouth daily.      [name] (SYSTANE) 0.4-0.3 % SOLN Apply 1 drop to eye 2 (two) times daily.      . pravastatin (PRAVACHOL) 20 MG tablet 1 BY MOUTH DAILY  90 tablet  1   No current facility-administered medications on file prior to visit.     REVIEW OF SYSTEMS: No interval change PHYSICAL EXAMINATION:   Vital signs are BP 178/100  Pulse 78  Temp(Src) 97.8 F (36.6 C) (Oral)  Resp 16  Ht 5' (1.524 m)  Wt 124 lb (56.246 kg)  BMI 24.22 kg/m2  SpO2 99% General: The patient appears their stated  age. HEENT:  No gross abnormalities Pulmonary:  Non labored breathing Abdomen: Soft and non-tender Musculoskeletal: There are no major deformities. Neurologic: No focal weakness or paresthesias are detected, Skin: There are no ulcer or rashes noted. Psychiatric: The patient has normal affect.    Diagnostic Studies CT angiogram has been reviewed by myself.  The following other radiological summary: 1. Successful EVAR of fusiform infrarenal abdominal aortic aneurysm.  The excluded aneurysm sac continues to decrease in size measuring  3.8 x 3.2 cm today compared to 4.8 x 3.6 cm in May of 2013. No  evidence of endoleak or other complicating feature.  2. Stable juxtarenal aneurysmal dilatation of the abdominal aorta to  3.2 cm in transverse diameter.  3. 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  4. Incompletely imaged ectasia of the ascending thoracic aorta. The  imaged portion measures up to 4 cm in diameter.  5. Extensive atherosclerotic vascular disease including multivessel  coronary artery disease.  NON VASCULAR  1. Ancillary findings as above without significant interval change.  Signed,   Assessment: Status post endovascular aneurysm repair Plan: No evidence of a type I endoleak by CT scan.  Aneurysm has continued to decrease in size.  I have recommended continued followup with ultrasound in one year.  Eldridge Abrahams, M.D. [facility] [place] Office: [phone] Pager:  [phone]

## 2013-12-11 ENCOUNTER — Other Ambulatory Visit: Payer: Self-pay | Admitting: Internal Medicine

## 2014-03-28 ENCOUNTER — Other Ambulatory Visit: Payer: Self-pay | Admitting: Internal Medicine

## 2014-04-19 ENCOUNTER — Other Ambulatory Visit: Payer: Self-pay | Admitting: Internal Medicine

## 2014-04-21 ENCOUNTER — Other Ambulatory Visit: Payer: Self-pay | Admitting: Internal Medicine

## 2014-05-02 ENCOUNTER — Other Ambulatory Visit: Payer: Self-pay | Admitting: Internal Medicine

## 2014-05-24 ENCOUNTER — Other Ambulatory Visit: Payer: Self-pay | Admitting: Internal Medicine

## 2014-11-25 ENCOUNTER — Encounter: Payer: Self-pay | Admitting: Family

## 2014-11-28 ENCOUNTER — Ambulatory Visit (INDEPENDENT_AMBULATORY_CARE_PROVIDER_SITE_OTHER): Payer: Medicare Other | Admitting: Family

## 2014-11-28 ENCOUNTER — Ambulatory Visit (HOSPITAL_COMMUNITY)
Admission: RE | Admit: 2014-11-28 | Discharge: 2014-11-28 | Disposition: A | Discharge status: Home / Self Care | Payer: Medicare Other | Source: Ambulatory Visit | Attending: Surgery | Admitting: Surgery

## 2014-11-28 ENCOUNTER — Encounter: Payer: Self-pay | Admitting: Family

## 2014-11-28 VITALS — BP 146/83 | HR 63 | Temp 96.9°F | Resp 16 | Ht 61.0 in | Wt 123.0 lb

## 2014-11-28 DIAGNOSIS — Z4889 Encounter for other specified surgical aftercare: Secondary | ICD-10-CM | POA: Diagnosis not present

## 2014-11-28 DIAGNOSIS — Z48812 Encounter for surgical aftercare following surgery on the circulatory system: Secondary | ICD-10-CM

## 2014-11-28 DIAGNOSIS — I714 Abdominal aortic aneurysm, without rupture, unspecified: Secondary | ICD-10-CM

## 2014-11-28 DIAGNOSIS — Z95828 Presence of other vascular implants and grafts: Secondary | ICD-10-CM | POA: Diagnosis not present

## 2014-11-28 NOTE — Progress Notes (Signed)
[facility] [place]  Established EVAR  History of Present Illness  Rebecca Knight is a 81 y.o. (February 07, 1923) female patient of Dr. [name] who is status post endovascular repair of a AAA in April 2013 who presents for routine follow up. Most recent EVAR duplex (Date: 11/08/13) demonstrates: 3.6 cm sac size. Most recent CTA (Date: 11/22/13) demonstrates: no endoleak and 3.8 cm sac size. The patient denies back or abdominal pain. Pt denies allergy to IV dye, denies kidney problems. She does report urinary incontinence.  Pt denies history of stroke or TIA. She reports a weak feeling in one or both legs, indicates fear of stumbling.  She does not take any antiplatelet nor anticoagulant medication, she does take a daily statin.  Her chief complaint is the post herpetic herpes zoster pain on the left side of her face, states she has an appointment with her PCP this week.   Pt Diabetic: No Pt smoker: non-smoker   Past Medical History  Diagnosis Date  . Hypertension   . Hyperlipidemia   . Heart murmur   . Arthritis   . AAA (abdominal aortic aneurysm)     [name]  . Varicose veins   . Anxiety   . Osteoporosis   . History of colon cancer 1995    Dr Lennie Hummer  . Herpes zoster of eye 2002    Dr. Herbert Deaner  . Post herpetic neuralgia   . Diverticulosis of colon   . Calculus of gallbladder without mention of cholecystitis or obstruction   . Cancer     Colon and Rectal   Past Surgical History  Procedure Laterality Date  . Tonsillectomy    . Colon surgery      RESECTION in 1990s  . Eye surgery      CAT EXT  OU  . Endovascular stent insertion  08/08/2011    Procedure: ENDOVASCULAR STENT GRAFT INSERTION;  Surgeon: [name], MD;  Location: [facility] OR;  Service: Vascular;  Laterality: N/A;  Endo Vascular Aortic stent placement with gore   Social History History  Substance Use Topics  . Smoking status: Never Smoker   . Smokeless tobacco: Never Used  . Alcohol  Use: No   Family History Family History  Problem Relation Age of Onset  . Hypertension Father   . Heart attack Father 68  . Diabetes Sister   . Heart attack Sister 71     her twin  . Diabetes Brother   . Heart attack Brother     in 44s  . Anesthesia problems Neg Hx   . Hypotension Neg Hx   . Malignant hyperthermia Neg Hx   . Pseudochol deficiency Neg Hx   . Brain cancer Daughter   . Stroke Mother     > 56   Current Outpatient Prescriptions on File Prior to Visit  Medication Sig Dispense Refill  . calcium-vitamin D (OSCAL) 250-125 MG-UNIT per tablet Take 1 tablet by mouth daily.    . Carboxymethylcellulose Sodium (LUBRICANT EYE DROPS OP) Apply to eye daily.    . fish oil-omega-3 fatty acids 1000 MG capsule Take 1 g by mouth daily.    Marland Kitchen losartan (COZAAR) 100 MG tablet TAKE 1 TABLET BY MOUTH EVERY DAY--REPLACING BENAZEPRIL 90 tablet 3  . metoprolol (LOPRESSOR) 50 MG tablet PT NEEDS COMPLETE PHYSICAL AND FASTING LABS.  TAKE 1 TABLET BY MOUTH TWICE A DAY. (Patient taking differently: 25 mg. PT NEEDS COMPLETE PHYSICAL AND FASTING LABS.  TAKE 3 TABLETS  Twice daily) 180 tablet 0  . Polyethyl Glycol-Propyl Glycol (SYSTANE) 0.4-0.3 % SOLN Apply 1 drop to eye 2 (two) times daily.    . pravastatin (PRAVACHOL) 20 MG tablet 1 BY MOUTH DAILY 90 tablet 1  . Cholecalciferol (VITAMIN D3) 1000 UNITS CAPS Take by mouth daily.    Marland Kitchen gabapentin (NEURONTIN) 400 MG capsule TAKE ONE CAPSULE TWICE DAILY (Patient not taking: Reported on 11/28/2014) 180 capsule 0  . mirabegron ER (MYRBETRIQ) 25 MG TB24 tablet Take 25 mg by mouth daily.     No current facility-administered medications on file prior to visit.   Allergies  Allergen Reactions  . Penicillins Rash    Rash      ROS: See HPI for pertinent positives and negatives.  Physical Examination  Filed Vitals:   11/28/14 1017 11/28/14 1031  BP: 172/82 146/83  Pulse: 65 63  Temp: 96.9 F (36.1 C)   TempSrc: Oral   Resp: 16   Height: 5\' 1"   (1.549 m)   Weight: 123 lb (55.792 kg)   SpO2: 98%    Body mass index is 23.25 kg/(m^2).  General: A&O x 3, WD.  Pulmonary: Sym exp, good air movt, CTAB, no rales, rhonchi, or wheezing.  Cardiac: RRR, Nl S1, S2, positive murmur  Vascular: Vessel Right Left  Radial 2+Palpable 2+Palpable  Carotid transmitted cardiac murmur Audible,without bruit  Aorta palpable N/A  Femoral 2+Palpable 2+Palpable  Popliteal not palpable not palpable  PT Not Palpable Not Palpable  DP Faintly Palpable 1+Palpable   Gastrointestinal: soft, NTND, -G/R, - HSM, - palpable masses, - CVATB.  Musculoskeletal: M/S 4/5 throughout, Extremities without ischemic changes.  Neurologic: Pain and light touch intact in extremities, Motor exam as listed above. Is hard of hearing.         Non-Invasive Vascular Imaging  EVAR Duplex (Date: 11/28/2014) ABDOMINAL AORTA DUPLEX EVALUATION - POST ENDOVASCULAR REPAIR    INDICATION: Evaluation of endovascular abdominal repair of aortic aneurysm.    PREVIOUS INTERVENTION(S):     DUPLEX EXAM:      DIAMETER AP (cm) DIAMETER TRANSVERSE (cm) VELOCITIES (cm/sec)  Aorta 3.18 3.59 79  Right Common Iliac 1.36  52  Left Common Iliac 1.13  34    Comparison Study       Date DIAMETER AP (cm) DIAMETER TRANSVERSE (cm)  11/08/2013 3.0 3.6     ADDITIONAL FINDINGS:     IMPRESSION: Patent endovascular abdominal aortic repair with a maximum diameter of 3.18 x 3.59 cm. No evidence of endoleak.  Proximal edge of endograft possibly not fully attached, as seen on last exam.       CTA Abd/Pelvis Duplex (Date: November 22, 2013) 1. Successful EVAR of fusiform infrarenal abdominal aortic aneurysm.  The excluded aneurysm sac continues to decrease in size measuring  3.8 x 3.2 cm today compared to 4.8 x 3.6 cm in May of 2013. No  evidence of endoleak or other complicating feature.  2. Stable juxtarenal aneurysmal dilatation of the abdominal aorta to   3.2 cm in transverse diameter.  3. 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  4. Incompletely imaged ectasia of the ascending thoracic aorta. The  imaged portion measures up to 4 cm in diameter.  5. Extensive atherosclerotic vascular disease including multivessel  coronary artery disease.  NON VASCULAR  1. Ancillary findings as above without significant interval change.    Medical Decision Making  Rebecca Knight is a 81 y.o. female who presents s/p EVAR (Date: April 2013).  Pt is asymptomatic with stable sac size, no evidence of endo leak. Proximal edge of endograft possibly not fully attached, as seen on last exam  Pt saw Dr. [name] in August 2015 after I saw her in July 2015 for further evaluation of the proximal edge of the endo graft which is not fully attached, has soft thrombus visualized on Duplex. She then had a CTA which Dr. [name] evaluated and he then recommended pt have a one year abdominal Duplex follow up which was done today.  I discussed with the patient the importance of surveillance of the endograft.  The next endograft duplex will be scheduled for 12 months, see NP, on a day that Dr. [name] is in the office.   The patient will follow up with [place] in 12 months with these studies.  I emphasized the importance of maximal medical management including strict control of blood pressure, blood glucose, and lipid levels, antiplatelet agents, obtaining regular exercise, and cessation of smoking.   Thank you for allowing [place] to participate in this patient's care.  Clemon Chambers, RN, MSN, FNP-C [facility] [place] Office: [phone]  Clinic Physician: Kellie Simmering  11/28/2014, 10:45 AM

## 2014-11-28 NOTE — Progress Notes (Signed)
Filed Vitals:   11/28/14 1017 11/28/14 1031  BP: 172/82 146/83  Pulse: 65 63  Temp: 96.9 F (36.1 C)   TempSrc: Oral   Resp: 16   Height: 5\' 1"  (1.549 m)   Weight: 123 lb (55.792 kg)   SpO2: 98%

## 2014-12-20 ENCOUNTER — Telehealth: Payer: Self-pay | Admitting: Cardiovascular Disease

## 2014-12-20 ENCOUNTER — Ambulatory Visit (INDEPENDENT_AMBULATORY_CARE_PROVIDER_SITE_OTHER): Payer: Medicare Other | Admitting: Cardiovascular Disease

## 2014-12-20 ENCOUNTER — Encounter: Payer: Self-pay | Admitting: Cardiovascular Disease

## 2014-12-20 VITALS — BP 176/98 | HR 67 | Ht 64.0 in | Wt 117.7 lb

## 2014-12-20 DIAGNOSIS — R079 Chest pain, unspecified: Secondary | ICD-10-CM

## 2014-12-20 MED ORDER — SULFAMETHOXAZOLE-TRIMETHOPRIM 800-160 MG PO TABS: 1.0000 | ORAL_TABLET | Freq: Two times a day (BID) | ORAL | Status: DC

## 2014-12-20 NOTE — Patient Instructions (Signed)
Dr Melida Quitter has recommended making the following medication changes: START Bactrim Double Strength - take 1 tablet by mouth TWICE DAILY FOR 3 (THREE) DAYS THEN STOP  Your physician has requested that you have a lexiscan myoview. For further information please visit [email or web address]. Please follow instruction sheet, as given.  Dr Oval Linsey recommends that you follow-up with her as needed.

## 2014-12-20 NOTE — Progress Notes (Signed)
Cardiology Office Note   Date:  12/20/2014   ID:  Rebecca Knight, DOB January 17, 1923, MRN [id number]  PCP:  Rachell Cipro, MD  Cardiologist:   Sharol Harness, MD   Chief Complaint  Patient presents with  . Chest Pain    patient reports chest pain at night when laying down to go to sleep. aide reports behavior changes over the last couple weeks and unsteady on her feet.      History of Present Illness: Rebecca Knight is a 81 y.o. female with hypertension, hyperlipidemia and AAA s/p endovascular repair (07/2011) who presents for an evaluation of chest pain.  Rebecca Knight reports one week of chest pain when laying down in bed on her L side.  It is not associated with SOB, lightheadedness, dizziness, nausea, vomiting or palpitations.  Rebecca Knight is accompanied by one of the transportation assistance from her assisted living facility. She reports that the staff has been very concerned about lately. He doesn't seem like herself and has been very fatigued. At baseline she is very independent and functional. She denies herself around and gets all of her doctors appointments without problem. However over the last 2 weeks this is changed. They're very unsteady on her feet and was able unable to make it to her doctor's appointment on Friday. Deny any fever, chills, or dysuria.  Her antihypertensive medications were held yesterday and today due to gait instability.  A urinalysis was checked but they are unaware of the results.  [name] follows with Dr. [name] for her AAA.  She last underwent CT-A on 11/22/13 and there was no endoleak and a 3.8 cm sac size.     Past Medical History  Diagnosis Date  . Hypertension   . Hyperlipidemia   . Heart murmur   . Arthritis   . AAA (abdominal aortic aneurysm)     [name]  . Varicose veins   . Anxiety   . Osteoporosis   . History of colon cancer 1995    Dr Lennie Hummer  . Herpes zoster of eye 2002    Dr. Herbert Deaner  . Post herpetic neuralgia   .  Diverticulosis of colon   . Calculus of gallbladder without mention of cholecystitis or obstruction   . Cancer     Colon and Rectal    Past Surgical History  Procedure Laterality Date  . Tonsillectomy    . Colon surgery      RESECTION in 1990s  . Eye surgery      CAT EXT  OU  . Endovascular stent insertion  08/08/2011    Procedure: ENDOVASCULAR STENT GRAFT INSERTION;  Surgeon: [name], MD;  Location: [facility] OR;  Service: Vascular;  Laterality: N/A;  Endo Vascular Aortic stent placement with gore     Current Outpatient Prescriptions  Medication Sig Dispense Refill  . Cholecalciferol (VITAMIN D3) 1000 UNITS CAPS Take by mouth daily.    . DULoxetine (CYMBALTA) 30 MG capsule Take 1 capsule by mouth 2 (two) times daily.  2  . losartan (COZAAR) 50 MG tablet Take 50 mg by mouth daily.    . metoprolol tartrate (LOPRESSOR) 25 MG tablet Take 25 mg by mouth 2 (two) times daily.    [name] (SYSTANE) 0.4-0.3 % SOLN Apply 1 drop to eye 2 (two) times daily.    . pravastatin (PRAVACHOL) 20 MG tablet 1 BY MOUTH DAILY 90 tablet 1  . sulfamethoxazole-trimethoprim (BACTRIM DS,SEPTRA DS) 800-160 MG per tablet Take  1 tablet by mouth 2 (two) times daily. For 3 days 6 tablet 0   No current facility-administered medications for this visit.    Allergies:   Penicillins    Social History:  The patient  reports that she has never smoked. She has never used smokeless tobacco. She reports that she does not drink alcohol or use illicit drugs.   Family History:  The patient's family history includes Brain cancer in her daughter; Diabetes in her brother and sister; Heart attack in her brother; Heart attack (age of onset: 53) in her father; Heart attack (age of onset: 21) in her sister; Hypertension in her father; Stroke in her mother. There is no history of Anesthesia problems, Hypotension, Malignant hyperthermia, or Pseudochol deficiency.    ROS:  Please see the history of present  illness.   Otherwise, review of systems are positive for none.   All other systems are reviewed and negative.    PHYSICAL EXAM: VS:  BP 176/98 mmHg  Pulse 67  Ht 5\' 4"  (1.626 m)  Wt 53.388 kg (117 lb 11.2 oz)  BMI 20.19 kg/m2 , BMI Body mass index is 20.19 kg/(m^2). GENERAL:  Well appearing HEENT:  Pupils equal round and reactive, fundi not visualized, oral mucosa unremarkable NECK:  No jugular venous distention, waveform within normal limits, carotid upstroke brisk and symmetric, no bruits, no thyromegaly LYMPHATICS:  No cervical adenopathy LUNGS:  Clear to auscultation bilaterally HEART:  RRR.  PMI not displaced or sustained,S1 and S2 within normal limits, no S3, no S4, no clicks, no rubs, III/VI early-peaking systolic murmur a the LUSB  ABD:  Flat, positive bowel sounds normal in frequency in pitch, no bruits, no rebound, no guarding, no midline pulsatile mass, no hepatomegaly, no splenomegaly EXT:  2 plus pulses throughout, no edema, no cyanosis no clubbing SKIN:  No rashes no nodules NEURO:  Cranial nerves II through XII grossly intact, motor grossly intact throughout PSYCH:  Cognitively intact, oriented to person place and time    EKG:  EKG is ordered today. The ekg ordered today demonstrates sinus rhthm at 67 bpm.  LAFB.     Recent Labs: No results found for requested labs within last 365 days.  SP 1.017, Ph 6, cloudy  WBC 2+ , leuk est 2+, nitirite -, WBC >30, RBC 0-2, calcium oxalate, few bacteria  Lipid Panel    Component Value Date/Time   CHOL 149 10/20/2012 1103   TRIG 126.0 10/20/2012 1103   HDL 47.00 10/20/2012 1103   CHOLHDL 3 10/20/2012 1103   VLDL 25.2 10/20/2012 1103   LDLCALC 77 10/20/2012 1103   LDLDIRECT 82.1 09/06/2009 0000      Wt Readings from Last 3 Encounters:  12/20/14 53.388 kg (117 lb 11.2 oz)  11/28/14 55.792 kg (123 lb)  11/22/13 56.246 kg (124 lb)      Other studies Reviewed: Additional studies/ records that were reviewed today  include:  Review of the above records demonstrates:  Please see elsewhere in the note.     ASSESSMENT AND PLAN:   # UTI: I called the [facility] facility, and Ms. Buth's U/A was consistent with a UTI. - Bactrim DS 1 tab bid x3 days  # Chest pain: Very atypical and likely not cardiac.  However, she has several risk factors and ischemia can present quite atypically in the elderly.  Also, she has not exerted herself, so I am unable to determine how he symptoms augment with exertion. - Lexiscan Myoview - Consider  starting ASA 81mg  daily if no contraindications per PCP  # HTN: Blood pressure poorly controlled off BP meds.   - Restart home losartan and metoprolol.  # HL: Managed by PCP, Dr. Ernie Hew.  Agree with statin therapy.  # AAA s/p endovascular repair: Stable.  Managed by Dr. Nechama Guard.   Current medicines are reviewed at length with the patient today.  The patient does not have concerns regarding medicines.  The following changes have been made:  Restart home BP meds, start Bactrim  Labs/ tests ordered today include:    Orders Placed This Encounter  Procedures  . Myocardial Perfusion Imaging  . EKG 12-Lead     Disposition:   FU with Dr. Jonelle Sidle C. Oval Linsey prn    Signed, Sharol Harness, MD  12/20/2014 2:39 PM    [place]

## 2014-12-20 NOTE — Telephone Encounter (Signed)
Received records from Dr Rachell Cipro for appointment on 12/20/14 with Dr Oval Linsey.  Records given to [facility] (medical records) for Dr Blenda Mounts schedule 12/20/14. lp

## 2014-12-23 ENCOUNTER — Emergency Department (HOSPITAL_COMMUNITY): Payer: Medicare Other

## 2014-12-23 ENCOUNTER — Telehealth (HOSPITAL_COMMUNITY): Payer: Self-pay | Admitting: *Deleted

## 2014-12-23 ENCOUNTER — Encounter (HOSPITAL_COMMUNITY): Payer: Self-pay | Admitting: Family Medicine

## 2014-12-23 ENCOUNTER — Inpatient Hospital Stay (HOSPITAL_COMMUNITY): Payer: Medicare Other

## 2014-12-23 ENCOUNTER — Inpatient Hospital Stay (HOSPITAL_COMMUNITY)
Admission: EM | Admit: 2014-12-23 | Discharge: 2014-12-27 | DRG: 064 | Disposition: A | Discharge status: Home Health | Payer: Medicare Other | Attending: Internal Medicine | Admitting: Internal Medicine

## 2014-12-23 DIAGNOSIS — I6330 Cerebral infarction due to thrombosis of unspecified cerebral artery: Secondary | ICD-10-CM | POA: Diagnosis present

## 2014-12-23 DIAGNOSIS — I639 Cerebral infarction, unspecified: Principal | ICD-10-CM | POA: Insufficient documentation

## 2014-12-23 DIAGNOSIS — Z9842 Cataract extraction status, left eye: Secondary | ICD-10-CM | POA: Diagnosis not present

## 2014-12-23 DIAGNOSIS — R410 Disorientation, unspecified: Secondary | ICD-10-CM | POA: Insufficient documentation

## 2014-12-23 DIAGNOSIS — Z85048 Personal history of other malignant neoplasm of rectum, rectosigmoid junction, and anus: Secondary | ICD-10-CM | POA: Diagnosis not present

## 2014-12-23 DIAGNOSIS — Z9841 Cataract extraction status, right eye: Secondary | ICD-10-CM | POA: Diagnosis not present

## 2014-12-23 DIAGNOSIS — I253 Aneurysm of heart: Secondary | ICD-10-CM | POA: Diagnosis present

## 2014-12-23 DIAGNOSIS — E785 Hyperlipidemia, unspecified: Secondary | ICD-10-CM | POA: Diagnosis present

## 2014-12-23 DIAGNOSIS — Z79899 Other long term (current) drug therapy: Secondary | ICD-10-CM

## 2014-12-23 DIAGNOSIS — R627 Adult failure to thrive: Secondary | ICD-10-CM

## 2014-12-23 DIAGNOSIS — I63312 Cerebral infarction due to thrombosis of left middle cerebral artery: Secondary | ICD-10-CM | POA: Diagnosis not present

## 2014-12-23 DIAGNOSIS — M1990 Unspecified osteoarthritis, unspecified site: Secondary | ICD-10-CM | POA: Diagnosis present

## 2014-12-23 DIAGNOSIS — M810 Age-related osteoporosis without current pathological fracture: Secondary | ICD-10-CM | POA: Diagnosis present

## 2014-12-23 DIAGNOSIS — R55 Syncope and collapse: Secondary | ICD-10-CM | POA: Insufficient documentation

## 2014-12-23 DIAGNOSIS — G9340 Encephalopathy, unspecified: Secondary | ICD-10-CM | POA: Diagnosis present

## 2014-12-23 DIAGNOSIS — Z880 Allergy status to penicillin: Secondary | ICD-10-CM

## 2014-12-23 DIAGNOSIS — Z66 Do not resuscitate: Secondary | ICD-10-CM | POA: Diagnosis present

## 2014-12-23 DIAGNOSIS — I10 Essential (primary) hypertension: Secondary | ICD-10-CM | POA: Diagnosis not present

## 2014-12-23 DIAGNOSIS — I6789 Other cerebrovascular disease: Secondary | ICD-10-CM | POA: Diagnosis not present

## 2014-12-23 DIAGNOSIS — E782 Mixed hyperlipidemia: Secondary | ICD-10-CM | POA: Diagnosis present

## 2014-12-23 DIAGNOSIS — I6339 Cerebral infarction due to thrombosis of other cerebral artery: Secondary | ICD-10-CM | POA: Diagnosis not present

## 2014-12-23 LAB — URINALYSIS, ROUTINE W REFLEX MICROSCOPIC
BILIRUBIN URINE: NEGATIVE
Glucose, UA: NEGATIVE mg/dL
Hgb urine dipstick: NEGATIVE
Ketones, ur: NEGATIVE mg/dL
Leukocytes, UA: NEGATIVE
NITRITE: NEGATIVE
PROTEIN: NEGATIVE mg/dL
SPECIFIC GRAVITY, URINE: 1.007 (ref 1.005–1.030)
UROBILINOGEN UA: 0.2 mg/dL (ref 0.0–1.0)
pH: 7 (ref 5.0–8.0)

## 2014-12-23 LAB — I-STAT CHEM 8, ED
BUN: 16 mg/dL (ref 6–20)
CALCIUM ION: 1.32 mmol/L — AB (ref 1.13–1.30)
Chloride: 97 mmol/L — ABNORMAL LOW (ref 101–111)
Creatinine, Ser: 1.2 mg/dL — ABNORMAL HIGH (ref 0.44–1.00)
Glucose, Bld: 95 mg/dL (ref 65–99)
HCT: 44 % (ref 36.0–46.0)
Hemoglobin: 15 g/dL (ref 12.0–15.0)
Potassium: 4.1 mmol/L (ref 3.5–5.1)
SODIUM: 137 mmol/L (ref 135–145)
TCO2: 30 mmol/L (ref 0–100)

## 2014-12-23 LAB — SEDIMENTATION RATE: Sed Rate: 2 mm/hr (ref 0–22)

## 2014-12-23 LAB — VITAMIN B12: VITAMIN B 12: 585 pg/mL (ref 180–914)

## 2014-12-23 LAB — CBC
HCT: 42.3 % (ref 36.0–46.0)
HEMOGLOBIN: 14.2 g/dL (ref 12.0–15.0)
MCH: 28.7 pg (ref 26.0–34.0)
MCHC: 33.6 g/dL (ref 30.0–36.0)
MCV: 85.5 fL (ref 78.0–100.0)
PLATELETS: 183 10*3/uL (ref 150–400)
RBC: 4.95 MIL/uL (ref 3.87–5.11)
RDW: 13.6 % (ref 11.5–15.5)
WBC: 6.9 10*3/uL (ref 4.0–10.5)

## 2014-12-23 LAB — COMPREHENSIVE METABOLIC PANEL
ALK PHOS: 51 U/L (ref 38–126)
ALT: 13 U/L — ABNORMAL LOW (ref 14–54)
ANION GAP: 8 (ref 5–15)
AST: 26 U/L (ref 15–41)
Albumin: 3.9 g/dL (ref 3.5–5.0)
BILIRUBIN TOTAL: 0.7 mg/dL (ref 0.3–1.2)
BUN: 10 mg/dL (ref 6–20)
CALCIUM: 10.1 mg/dL (ref 8.9–10.3)
CO2: 27 mmol/L (ref 22–32)
Chloride: 100 mmol/L — ABNORMAL LOW (ref 101–111)
Creatinine, Ser: 1.2 mg/dL — ABNORMAL HIGH (ref 0.44–1.00)
GFR, EST AFRICAN AMERICAN: 45 mL/min — AB (ref >60)
GFR, EST NON AFRICAN AMERICAN: 39 mL/min — AB (ref >60)
Glucose, Bld: 100 mg/dL — ABNORMAL HIGH (ref 65–99)
Potassium: 4.3 mmol/L (ref 3.5–5.1)
Sodium: 135 mmol/L (ref 135–145)
TOTAL PROTEIN: 6.6 g/dL (ref 6.5–8.1)

## 2014-12-23 LAB — DIFFERENTIAL
Basophils Absolute: 0 10*3/uL (ref 0.0–0.1)
Basophils Relative: 0 % (ref 0–1)
EOS PCT: 1 % (ref 0–5)
Eosinophils Absolute: 0.1 10*3/uL (ref 0.0–0.7)
LYMPHS ABS: 1.5 10*3/uL (ref 0.7–4.0)
LYMPHS PCT: 21 % (ref 12–46)
MONO ABS: 0.6 10*3/uL (ref 0.1–1.0)
MONOS PCT: 8 % (ref 3–12)
Neutro Abs: 4.8 10*3/uL (ref 1.7–7.7)
Neutrophils Relative %: 70 % (ref 43–77)

## 2014-12-23 LAB — RAPID URINE DRUG SCREEN, HOSP PERFORMED
Amphetamines: NOT DETECTED
Barbiturates: NOT DETECTED
Benzodiazepines: NOT DETECTED
Cocaine: NOT DETECTED
OPIATES: NOT DETECTED
Tetrahydrocannabinol: NOT DETECTED

## 2014-12-23 LAB — PROTIME-INR
INR: 1.21 (ref 0.00–1.49)
PROTHROMBIN TIME: 15.4 s — AB (ref 11.6–15.2)

## 2014-12-23 LAB — CBG MONITORING, ED: Glucose-Capillary: 85 mg/dL (ref 65–99)

## 2014-12-23 LAB — FOLATE: FOLATE: 22.6 ng/mL (ref >5.9)

## 2014-12-23 LAB — TSH: TSH: 0.955 u[IU]/mL (ref 0.350–4.500)

## 2014-12-23 LAB — ETHANOL: Alcohol, Ethyl (B): <5 mg/dL (ref <5)

## 2014-12-23 LAB — I-STAT TROPONIN, ED: Troponin i, poc: 0 ng/mL (ref 0.00–0.08)

## 2014-12-23 LAB — APTT: aPTT: 31 seconds (ref 24–37)

## 2014-12-23 MED ORDER — METOPROLOL TARTRATE 25 MG PO TABS
25.0000 mg | ORAL_TABLET | Freq: Two times a day (BID) | ORAL | Status: DC
  Administered 2014-12-23 – 2014-12-27 (×8): 25 mg via ORAL
  Filled 2014-12-23 (×8): qty 1

## 2014-12-23 MED ORDER — LOSARTAN POTASSIUM 50 MG PO TABS
50.0000 mg | ORAL_TABLET | Freq: Every day | ORAL | Status: DC
  Administered 2014-12-24 – 2014-12-27 (×4): 50 mg via ORAL
  Filled 2014-12-23 (×4): qty 1

## 2014-12-23 MED ORDER — SODIUM CHLORIDE 0.9 % IV BOLUS (SEPSIS)
500.0000 mL | Freq: Once | INTRAVENOUS | Status: AC
  Administered 2014-12-23: 500 mL via INTRAVENOUS

## 2014-12-23 MED ORDER — ONDANSETRON HCL 4 MG PO TABS: 4.0000 mg | ORAL_TABLET | Freq: Four times a day (QID) | ORAL | Status: DC | PRN

## 2014-12-23 MED ORDER — ACETAMINOPHEN 650 MG RE SUPP: 650.0000 mg | Freq: Four times a day (QID) | RECTAL | Status: DC | PRN

## 2014-12-23 MED ORDER — ACETAMINOPHEN 325 MG PO TABS: 650.0000 mg | ORAL_TABLET | Freq: Four times a day (QID) | ORAL | Status: DC | PRN

## 2014-12-23 MED ORDER — SODIUM CHLORIDE 0.9 % IV SOLN
INTRAVENOUS | Status: DC
Start: 2014-12-23 — End: 2014-12-23
  Administered 2014-12-23: 18:00:00 via INTRAVENOUS

## 2014-12-23 MED ORDER — LABETALOL HCL 5 MG/ML IV SOLN
5.0000 mg | INTRAVENOUS | Status: DC | PRN
  Administered 2014-12-24 – 2014-12-25 (×6): 5 mg via INTRAVENOUS
  Filled 2014-12-23 (×7): qty 4

## 2014-12-23 MED ORDER — ENOXAPARIN SODIUM 30 MG/0.3ML ~~LOC~~ SOLN
30.0000 mg | SUBCUTANEOUS | Status: DC
  Administered 2014-12-23 – 2014-12-26 (×4): 30 mg via SUBCUTANEOUS
  Filled 2014-12-23 (×4): qty 0.3

## 2014-12-23 MED ORDER — ONDANSETRON HCL 4 MG/2ML IJ SOLN: 4.0000 mg | Freq: Four times a day (QID) | INTRAMUSCULAR | Status: DC | PRN

## 2014-12-23 MED ORDER — ALUM & MAG HYDROXIDE-SIMETH 200-200-20 MG/5ML PO SUSP: 30.0000 mL | Freq: Four times a day (QID) | ORAL | Status: DC | PRN

## 2014-12-23 MED ORDER — LABETALOL HCL 5 MG/ML IV SOLN
10.0000 mg | Freq: Once | INTRAVENOUS | Status: AC
  Administered 2014-12-23: 10 mg via INTRAVENOUS
  Filled 2014-12-23: qty 4

## 2014-12-23 MED ORDER — PRAVASTATIN SODIUM 20 MG PO TABS
20.0000 mg | ORAL_TABLET | Freq: Every day | ORAL | Status: DC
  Administered 2014-12-23 – 2014-12-27 (×5): 20 mg via ORAL
  Filled 2014-12-23 (×5): qty 1

## 2014-12-23 NOTE — ED Notes (Signed)
Attempted report 

## 2014-12-23 NOTE — ED Notes (Signed)
Spoke with pts son Truman Hayward & updated him on pts room number & urine results

## 2014-12-23 NOTE — Progress Notes (Signed)
MD paged [place] patient htn 202/80. Orders received.

## 2014-12-23 NOTE — ED Notes (Signed)
Pts son's fiance, [place] request to be updated at [phone] on pts plan of care

## 2014-12-23 NOTE — ED Notes (Signed)
Pt here for mental status changes and weakness since last Friday. Sent here by her doctor r/o stroke. sts usually drives and active and has been very sleepy the last week.

## 2014-12-23 NOTE — ED Provider Notes (Signed)
CSN: [id number]     Arrival date & time 12/23/14  1113 History   First MD Initiated Contact with Patient 12/23/14 1129     Chief Complaint  Patient presents with  . Altered Mental Status     (Consider location/radiation/quality/duration/timing/severity/associated sxs/prior Treatment) Patient is a 81 y.o. female presenting with altered mental status.  Altered Mental Status   The pt is a pleasant 81 y/o female who up until 1 week ago was driving herself to the doctor and living in assisted care (very independent).  She was noted to start having some confusion and was taken to the doctor - initial UA showed possible UTI - bactrim and then Rocephin given but culture showed no sig growth.  She continues to have confusion and dereased appetite.  She has been very sleepy this week, confusion and disoriented but not having f/c/n/v or distress according to a friend (caregiver).  She was seen in her PCP office today with Dr. Ernie Hew and was sent to ED for ongoing confusion for stroke w/u and likely admission / rehabl placement.  Past Medical History  Diagnosis Date  . Hypertension   . Hyperlipidemia   . Heart murmur   . Arthritis   . AAA (abdominal aortic aneurysm)     [name]  . Varicose veins   . Anxiety   . Osteoporosis   . History of colon cancer 1995    Dr Lennie Hummer  . Herpes zoster of eye 2002    Dr. Herbert Deaner  . Post herpetic neuralgia   . Diverticulosis of colon   . Calculus of gallbladder without mention of cholecystitis or obstruction   . Cancer     Colon and Rectal   Past Surgical History  Procedure Laterality Date  . Tonsillectomy    . Colon surgery      RESECTION in 1990s  . Eye surgery      CAT EXT  OU  . Endovascular stent insertion  08/08/2011    Procedure: ENDOVASCULAR STENT GRAFT INSERTION;  Surgeon: [name], MD;  Location: [facility] OR;  Service: Vascular;  Laterality: N/A;  Endo Vascular Aortic stent placement with gore   Family History  Problem Relation Age of  Onset  . Hypertension Father   . Heart attack Father 53  . Diabetes Sister   . Heart attack Sister 62     her twin  . Diabetes Brother   . Heart attack Brother     in 109s  . Anesthesia problems Neg Hx   . Hypotension Neg Hx   . Malignant hyperthermia Neg Hx   . Pseudochol deficiency Neg Hx   . Brain cancer Daughter   . Stroke Mother     > 58   Social History  Substance Use Topics  . Smoking status: Never Smoker   . Smokeless tobacco: Never Used  . Alcohol Use: No   OB History    No data available     Review of Systems  Unable to perform ROS: Mental status change      Allergies  Penicillins  Home Medications   Prior to Admission medications   Medication Sig Start Date End Date Taking? Authorizing Provider  Cholecalciferol (VITAMIN D3) 1000 UNITS CAPS Take 1 capsule by mouth daily.    Yes Historical Provider, MD  DULoxetine (CYMBALTA) 30 MG capsule Take 1 capsule by mouth 2 (two) times daily. 11/29/14  Yes Historical Provider, MD  losartan (COZAAR) 50 MG tablet Take 50 mg by mouth daily.  Yes Historical Provider, MD  metoprolol tartrate (LOPRESSOR) 25 MG tablet Take 25 mg by mouth 2 (two) times daily.   Yes Historical Provider, MD  Multiple Vitamins-Minerals (MULTIVITAMIN PO) Take 1 tablet by mouth daily.   Yes Historical Provider, MD  Polyethyl Glycol-Propyl Glycol (SYSTANE) 0.4-0.3 % SOLN Apply 1 drop to eye 2 (two) times daily.   Yes Historical Provider, MD  pravastatin (PRAVACHOL) 20 MG tablet 1 BY MOUTH DAILY 10/04/13  Yes [name], MD  sulfamethoxazole-trimethoprim (BACTRIM DS,SEPTRA DS) 800-160 MG per tablet Take 1 tablet by mouth 2 (two) times daily. For 3 days 12/20/14  Yes Skeet Latch, MD   BP 186/102 mmHg  Pulse 73  Temp(Src) 97.6 F (36.4 C) (Oral)  Resp 23  SpO2 94% Physical Exam  Constitutional: She appears well-developed and well-nourished. No distress.  HENT:  Head: Normocephalic and atraumatic.  Mouth/Throat: Oropharynx is clear and  moist. No oropharyngeal exudate.  Eyes: Conjunctivae and EOM are normal. Pupils are equal, round, and reactive to light. Right eye exhibits no discharge. Left eye exhibits no discharge. No scleral icterus.  Neck: Normal range of motion. Neck supple. No JVD present. No thyromegaly present.  Cardiovascular: Normal rate, regular rhythm and intact distal pulses.  Exam reveals no gallop and no friction rub.   Murmur ( soft mumrur) heard. Pulmonary/Chest: Effort normal and breath sounds normal. No respiratory distress. She has no wheezes. She has no rales.  Abdominal: Soft. Bowel sounds are normal. She exhibits no distension and no mass. There is no tenderness.  Musculoskeletal: Normal range of motion. She exhibits no edema or tenderness.  Lymphadenopathy:    She has no cervical adenopathy.  Neurological: She is alert. Coordination normal.  Confusion re: date and length of time she has been at her assisted care but knows the president, location, name and where she was born.  Normal level of alertness, can SLR bilaterally, normal speech, coordination and strength in all 4 ext.  CN 3-12 normal.  Skin: Skin is warm and dry. No rash noted. No erythema.  Psychiatric: She has a normal mood and affect. Her behavior is normal.  Nursing note and vitals reviewed.   ED Course  Procedures (including critical care time) Labs Review Labs Reviewed  PROTIME-INR - Abnormal; Notable for the following:    Prothrombin Time 15.4 (*)    All other components within normal limits  COMPREHENSIVE METABOLIC PANEL - Abnormal; Notable for the following:    Chloride 100 (*)    Glucose, Bld 100 (*)    Creatinine, Ser 1.20 (*)    ALT 13 (*)    GFR calc non Af Amer 39 (*)    GFR calc Af Amer 45 (*)    All other components within normal limits  I-STAT CHEM 8, ED - Abnormal; Notable for the following:    Chloride 97 (*)    Creatinine, Ser 1.20 (*)    Calcium, Ion 1.32 (*)    All other components within normal limits   ETHANOL  APTT  CBC  DIFFERENTIAL  URINE RAPID DRUG SCREEN, HOSP PERFORMED  URINALYSIS, ROUTINE W REFLEX MICROSCOPIC (NOT AT [facility])  CBG MONITORING, ED  I-STAT TROPOININ, ED    Imaging Review Ct Head Wo Contrast  12/23/2014   CLINICAL DATA:  Gait disturbance and altered mental status ; lethargy  EXAM: CT HEAD WITHOUT CONTRAST  TECHNIQUE: Contiguous axial images were obtained from the base of the skull through the vertex without intravenous contrast.  COMPARISON:  July 21, 2005  FINDINGS: There is mild diffuse atrophy. There is no intracranial mass, hemorrhage, extra-axial fluid collection, or midline shift. There is small vessel disease throughout the centra semiovale which has progressed compared to prior study. There is small vessel disease throughout the left internal and external capsule regions as well as in the anterior limb of the right external capsule. There is evidence of a prior small lacunar infarct in the anterior right lentiform nucleus. There is evidence of a prior small lacunar infarct in the posterior limb of the left internal capsule. There is small vessel disease in the thalamic regions bilaterally. No acute infarct is demonstrable on this study. The bony calvarium appears intact. The mastoid air cells are clear.  IMPRESSION: Atrophy with fairly extensive supratentorial small vessel disease. No intracranial mass, hemorrhage, or acute appearing infarct.   Electronically Signed   By: Lowella Grip III M.D.   On: 12/23/2014 13:35   I have personally reviewed and evaluated these images and lab results as part of my medical decision-making.   EKG Interpretation   Date/Time:  Friday December 23 2014 11:44:30 EDT Ventricular Rate:  65 PR Interval:    QRS Duration: 88 QT Interval:  415 QTC Calculation: 431 R Axis:   -44 Text Interpretation:  Atrial fibrillation Inferior infarct, old Anterior  infarct, old since last tracing no significant change Abnormal ekg  Confirmed by  Amariah Kierstead  MD, Makaia Rappa ([id number]) on 12/23/2014 11:56:23 AM      MDM   Final diagnoses:  Confusion    The pt is in no distress - her VS show some hypertension but no other acute findings - check CT brain, labs and ECG.  D/w neuro - they agree decline Labs unremarkable - CT neg - will admit to hosptialist    Noemi Chapel, MD 12/23/14 1433

## 2014-12-23 NOTE — H&P (Signed)
[facility] History and Physical  [name] WNI:[id number] DOB: 1922-11-14 DOA: 12/23/2014  Referring physician:  PCP: Rachell Cipro, MD   Chief Complaint: Mental status changes/weakness/functional decline  HPI: Rebecca Knight is a 81 y.o. female with a past medical history of hypertension, dyslipidemia, referred to the emergency department by her primary care physician for further workup of confusion. At baseline she is highly active, independent on activities of daily living and instrumental activities of daily living. Over the past week she has been mildly confused, disoriented, saw her primary care provider who diagnosed with UTI and was given antibiotic therapy with Bactrim. Family members reporting that she is unsteady on her feet appears to have a generalized weakness. She denies focal neurological deficits, recent falls, fevers, chills, nausea, vomiting, abdominal pain or dysuria.                                                                                        Review of Systems:  Constitutional:  No weight loss, night sweats, Fevers, chills, positive for  fatigue and generalized weakness .  HEENT:  No headaches, Difficulty swallowing,Tooth/dental problems,Sore throat,  No sneezing, itching, ear ache, nasal congestion, post nasal drip,  Cardio-vascular:  No chest pain, Orthopnea, PND, swelling in lower extremities, anasarca, dizziness, palpitations  GI:  No heartburn, indigestion, abdominal pain, nausea, vomiting, diarrhea, change in bowel habits, loss of appetite  Resp:  No shortness of breath with exertion or at rest. No excess mucus, no productive cough, No non-productive cough, No coughing up of blood.No change in color of mucus.No wheezing.No chest wall deformity  Skin:  no rash or lesions.  GU:  no dysuria, change in color of urine, no urgency or frequency. No flank pain.  Musculoskeletal:  No joint pain or swelling. No decreased range of motion. No back  pain.  Psych:  No change in mood or affect. No depression positive for increased confusion Past Medical History  Diagnosis Date  . Hypertension   . Hyperlipidemia   . Heart murmur   . Arthritis   . AAA (abdominal aortic aneurysm)     [name]  . Varicose veins   . Anxiety   . Osteoporosis   . History of colon cancer 1995    Dr Lennie Hummer  . Herpes zoster of eye 2002    Dr. Herbert Deaner  . Post herpetic neuralgia   . Diverticulosis of colon   . Calculus of gallbladder without mention of cholecystitis or obstruction   . Cancer     Colon and Rectal   Past Surgical History  Procedure Laterality Date  . Tonsillectomy    . Colon surgery      RESECTION in 1990s  . Eye surgery      CAT EXT  OU  . Endovascular stent insertion  08/08/2011    Procedure: ENDOVASCULAR STENT GRAFT INSERTION;  Surgeon: [name], MD;  Location: [facility] OR;  Service: Vascular;  Laterality: N/A;  Endo Vascular Aortic stent placement with gore   Social History:  reports that she has never smoked. She has never used smokeless tobacco. She reports that she does not drink alcohol or  use illicit drugs.  Allergies  Allergen Reactions  . Penicillins Rash    Rash     Family History  Problem Relation Age of Onset  . Hypertension Father   . Heart attack Father 81  . Diabetes Sister   . Heart attack Sister 8     her twin  . Diabetes Brother   . Heart attack Brother     in 29s  . Anesthesia problems Neg Hx   . Hypotension Neg Hx   . Malignant hyperthermia Neg Hx   . Pseudochol deficiency Neg Hx   . Brain cancer Daughter   . Stroke Mother     > 26     Prior to Admission medications   Medication Sig Start Date End Date Taking? Authorizing Provider  Cholecalciferol (VITAMIN D3) 1000 UNITS CAPS Take 1 capsule by mouth daily.    Yes Historical Provider, MD  DULoxetine (CYMBALTA) 30 MG capsule Take 1 capsule by mouth 2 (two) times daily. 11/29/14  Yes Historical Provider, MD  losartan (COZAAR) 50 MG tablet  Take 50 mg by mouth daily.   Yes Historical Provider, MD  metoprolol tartrate (LOPRESSOR) 25 MG tablet Take 25 mg by mouth 2 (two) times daily.   Yes Historical Provider, MD  Multiple Vitamins-Minerals (MULTIVITAMIN PO) Take 1 tablet by mouth daily.   Yes Historical Provider, MD  Polyethyl Glycol-Propyl Glycol (SYSTANE) 0.4-0.3 % SOLN Apply 1 drop to eye 2 (two) times daily.   Yes Historical Provider, MD  pravastatin (PRAVACHOL) 20 MG tablet 1 BY MOUTH DAILY 10/04/13  Yes [name], MD  sulfamethoxazole-trimethoprim (BACTRIM DS,SEPTRA DS) 800-160 MG per tablet Take 1 tablet by mouth 2 (two) times daily. For 3 days 12/20/14  Yes Skeet Latch, MD   Physical Exam: Filed Vitals:   12/23/14 1301 12/23/14 1415 12/23/14 1445 12/23/14 1453  BP:  186/102  176/97  Pulse:  73 64 67  Temp: 97.6 F (36.4 C)     TempSrc:      Resp:  23 22 20   SpO2:  94% 97% 98%    Wt Readings from Last 3 Encounters:  12/20/14 53.388 kg (117 lb 11.2 oz)  11/28/14 55.792 kg (123 lb)  11/22/13 56.246 kg (124 lb)    General:  Appears calm and comfortable, she is in no acute distress, awake and alert. Oriented 3 during my evaluation Eyes: PERRL, normal lids, irises & conjunctiva ENT: grossly normal hearing, lips & tongue Neck: no LAD, masses or thyromegaly Cardiovascular: RRRshe has 2-6 systolic ejection murmur, no extremity edema Telemetry: SR, no arrhythmias  Respiratory: CTA bilaterally, no w/r/r. Normal respiratory effort. Abdomen: soft, ntnd Skin: no rash or induration seen on limited exam Musculoskeletal: grossly normal tone BUE/BLE Psychiatric: grossly normal mood and affect, speech fluent and appropriate Neurologic: she is awake and alert, no facial droop or slurred speech. Had a global 5 of 5 muscle strength, 2+ bilateral deep tendon reflexes           Labs on Admission:  Basic Metabolic Panel:  Recent Labs Lab 12/23/14 1200 12/23/14 1213  NA 135 137  K 4.3 4.1  CL 100* 97*  CO2 27   --   GLUCOSE 100* 95  BUN 10 16  CREATININE 1.20* 1.20*  CALCIUM 10.1  --    Liver Function Tests:  Recent Labs Lab 12/23/14 1200  AST 26  ALT 13*  ALKPHOS 51  BILITOT 0.7  PROT 6.6  ALBUMIN 3.9   No results for input(s): LIPASE,  AMYLASE in the last 168 hours. No results for input(s): AMMONIA in the last 168 hours. CBC:  Recent Labs Lab 12/23/14 1200 12/23/14 1213  WBC 6.9  --   NEUTROABS 4.8  --   HGB 14.2 15.0  HCT 42.3 44.0  MCV 85.5  --   PLT 183  --    Cardiac Enzymes: No results for input(s): CKTOTAL, CKMB, CKMBINDEX, TROPONINI in the last 168 hours.  BNP (last 3 results) No results for input(s): BNP in the last 8760 hours.  ProBNP (last 3 results) No results for input(s): PROBNP in the last 8760 hours.  CBG:  Recent Labs Lab 12/23/14 1259  GLUCAP 85    Radiological Exams on Admission: Ct Head Wo Contrast  12/23/2014   CLINICAL DATA:  Gait disturbance and altered mental status ; lethargy  EXAM: CT HEAD WITHOUT CONTRAST  TECHNIQUE: Contiguous axial images were obtained from the base of the skull through the vertex without intravenous contrast.  COMPARISON:  July 21, 2005  FINDINGS: There is mild diffuse atrophy. There is no intracranial mass, hemorrhage, extra-axial fluid collection, or midline shift. There is small vessel disease throughout the centra semiovale which has progressed compared to prior study. There is small vessel disease throughout the left internal and external capsule regions as well as in the anterior limb of the right external capsule. There is evidence of a prior small lacunar infarct in the anterior right lentiform nucleus. There is evidence of a prior small lacunar infarct in the posterior limb of the left internal capsule. There is small vessel disease in the thalamic regions bilaterally. No acute infarct is demonstrable on this study. The bony calvarium appears intact. The mastoid air cells are clear.  IMPRESSION: Atrophy with fairly  extensive supratentorial small vessel disease. No intracranial mass, hemorrhage, or acute appearing infarct.   Electronically Signed   By: Lowella Grip III M.D.   On: 12/23/2014 13:35    EKG: Independently reviewed.   Assessment/Plan Principal Problem:   Acute encephalopathy Active Problems:   HYPERLIPIDEMIA   Essential hypertension   1. Functional decline/failure to thrive. Rebecca Knight is a 81 year old female who is highly functional, currently resides at independent living facility had been driving until recently, presenting with a one-week history of progressive functional decline. She has had increased confusion along with generalized weakness and unsteadiness of gait. Urinalysis performed in the emergency room was unremarkable. Basic metabolic panel and CBC within normal limits. Her PCP was worried about the possibility of CVA. CT scan of brain did not show acute infarct. Will further workup with MRI of brain. Will consult physical therapy and occupational therapy. Will check sedimentation rate, B-12 level, folate level and TSH. Provide gentle IV fluid hydration with normal saline. 2. Hypertension. We'll continue metoprolol 25 mg by mouth twice a day and Cozar 50 mg by mouth daily 3. Dyslipidemia. Continue statin therapy  4. DVT prophylaxis Lovenox    Code Status: DO NOT RESUSCITATE, close eye discussed with patient and family members at bedside  DVT Prophylaxis: Lovenox Family Communication: Spoke with son and daughter present at bedside  Disposition Plan: will admit to the inpatient service  Time spent: 76 minutes  Kelvin Cellar [facility] Pager [phone]

## 2014-12-24 DIAGNOSIS — I63312 Cerebral infarction due to thrombosis of left middle cerebral artery: Secondary | ICD-10-CM

## 2014-12-24 LAB — BASIC METABOLIC PANEL
Anion gap: 10 (ref 5–15)
BUN: 9 mg/dL (ref 6–20)
CALCIUM: 9.6 mg/dL (ref 8.9–10.3)
CHLORIDE: 100 mmol/L — AB (ref 101–111)
CO2: 26 mmol/L (ref 22–32)
CREATININE: 1.11 mg/dL — AB (ref 0.44–1.00)
GFR calc non Af Amer: 42 mL/min — ABNORMAL LOW (ref >60)
GFR, EST AFRICAN AMERICAN: 49 mL/min — AB (ref >60)
GLUCOSE: 101 mg/dL — AB (ref 65–99)
Potassium: 3.5 mmol/L (ref 3.5–5.1)
Sodium: 136 mmol/L (ref 135–145)

## 2014-12-24 LAB — CBC
HCT: 41.6 % (ref 36.0–46.0)
Hemoglobin: 13.8 g/dL (ref 12.0–15.0)
MCH: 28.8 pg (ref 26.0–34.0)
MCHC: 33.2 g/dL (ref 30.0–36.0)
MCV: 86.7 fL (ref 78.0–100.0)
PLATELETS: 160 10*3/uL (ref 150–400)
RBC: 4.8 MIL/uL (ref 3.87–5.11)
RDW: 13.7 % (ref 11.5–15.5)
WBC: 5.5 10*3/uL (ref 4.0–10.5)

## 2014-12-24 LAB — LIPID PANEL
Cholesterol: 123 mg/dL (ref 0–200)
HDL: 46 mg/dL (ref >40)
LDL Cholesterol: 56 mg/dL (ref 0–99)
Total CHOL/HDL Ratio: 2.7 RATIO
Triglycerides: 106 mg/dL (ref <150)
VLDL: 21 mg/dL (ref 0–40)

## 2014-12-24 MED ORDER — POTASSIUM CHLORIDE CRYS ER 20 MEQ PO TBCR
40.0000 meq | EXTENDED_RELEASE_TABLET | Freq: Once | ORAL | Status: AC
  Administered 2014-12-24: 40 meq via ORAL
  Filled 2014-12-24: qty 2

## 2014-12-24 MED ORDER — ASPIRIN EC 81 MG PO TBEC
81.0000 mg | DELAYED_RELEASE_TABLET | Freq: Every day | ORAL | Status: DC
  Administered 2014-12-24 – 2014-12-27 (×4): 81 mg via ORAL
  Filled 2014-12-24 (×4): qty 1

## 2014-12-24 MED ORDER — STROKE: EARLY STAGES OF RECOVERY BOOK
Freq: Once | Status: AC
Start: 2014-12-24 — End: 2014-12-24
  Administered 2014-12-24: 12:00:00

## 2014-12-24 NOTE — Progress Notes (Signed)
[facility] PROGRESS NOTE   Rebecca Knight TIR:[id number] DOB: April 01, 1923 DOA: 12/23/2014 PCP: Rachell Cipro, MD  HPI/Subjective: Patient was eating this morning, denies any complaints. MRI of the brain showed stroke, all consult neurology.  Assessment/Plan: Principal Problem:   Acute encephalopathy Active Problems:   HYPERLIPIDEMIA   Essential hypertension   Acute stroke Patient presented to the hospital with generalized weakness. Patient also mentioned she has had problems driving so she quit driving recently. MRI of the brain showed acute infarct involving the left external capsule. Neurology consulted. Stroke workup.  Hypertension Restarted home medications.  Hyperlipidemia Check fasting lipid profile.  Code Status: DNR Family Communication: Plan discussed with the patient. Disposition Plan: Remains inpatient Diet: Diet Heart Room service appropriate?: Yes; Fluid consistency:: Thin  Consultants:  Neurology  Procedures:  None  Antibiotics:  None   Objective: Filed Vitals:   12/24/14 0959  BP: 138/86  Pulse: 76  Temp: 97.8 F (36.6 C)  Resp: 18    Intake/Output Summary (Last 24 hours) at 12/24/14 1103 Last data filed at 12/24/14 0800  Gross per 24 hour  Intake  537.5 ml  Output   1160 ml  Net -622.5 ml   There were no vitals filed for this visit.  Exam: General: Alert and awake, oriented x3, not in any acute distress. HEENT: anicteric sclera, pupils reactive to light and accommodation, EOMI CVS: S1-S2 clear, no murmur rubs or gallops Chest: clear to auscultation bilaterally, no wheezing, rales or rhonchi Abdomen: soft nontender, nondistended, normal bowel sounds, no organomegaly Extremities: no cyanosis, clubbing or edema noted bilaterally Neuro: Cranial nerves II-XII intact, no focal neurological deficits  Data Reviewed: Basic Metabolic Panel:  Recent Labs Lab 12/23/14 1200 12/23/14 1213 12/24/14 0431  NA 135 137 136  K  4.3 4.1 3.5  CL 100* 97* 100*  CO2 27  --  26  GLUCOSE 100* 95 101*  BUN 10 16 9   CREATININE 1.20* 1.20* 1.11*  CALCIUM 10.1  --  9.6   Liver Function Tests:  Recent Labs Lab 12/23/14 1200  AST 26  ALT 13*  ALKPHOS 51  BILITOT 0.7  PROT 6.6  ALBUMIN 3.9   No results for input(s): LIPASE, AMYLASE in the last 168 hours. No results for input(s): AMMONIA in the last 168 hours. CBC:  Recent Labs Lab 12/23/14 1200 12/23/14 1213 12/24/14 0431  WBC 6.9  --  5.5  NEUTROABS 4.8  --   --   HGB 14.2 15.0 13.8  HCT 42.3 44.0 41.6  MCV 85.5  --  86.7  PLT 183  --  160   Cardiac Enzymes: No results for input(s): CKTOTAL, CKMB, CKMBINDEX, TROPONINI in the last 168 hours. BNP (last 3 results) No results for input(s): BNP in the last 8760 hours.  ProBNP (last 3 results) No results for input(s): PROBNP in the last 8760 hours.  CBG:  Recent Labs Lab 12/23/14 1259  GLUCAP 85    Micro No results found for this or any previous visit (from the past 240 hour(s)).   Studies: Ct Head Wo Contrast  12/23/2014   CLINICAL DATA:  Gait disturbance and altered mental status ; lethargy  EXAM: CT HEAD WITHOUT CONTRAST  TECHNIQUE: Contiguous axial images were obtained from the base of the skull through the vertex without intravenous contrast.  COMPARISON:  July 21, 2005  FINDINGS: There is mild diffuse atrophy. There is no intracranial mass, hemorrhage, extra-axial fluid collection, or midline shift. There is small vessel disease throughout the centra semiovale  which has progressed compared to prior study. There is small vessel disease throughout the left internal and external capsule regions as well as in the anterior limb of the right external capsule. There is evidence of a prior small lacunar infarct in the anterior right lentiform nucleus. There is evidence of a prior small lacunar infarct in the posterior limb of the left internal capsule. There is small vessel disease in the thalamic  regions bilaterally. No acute infarct is demonstrable on this study. The bony calvarium appears intact. The mastoid air cells are clear.  IMPRESSION: Atrophy with fairly extensive supratentorial small vessel disease. No intracranial mass, hemorrhage, or acute appearing infarct.   Electronically Signed   By: Lowella Grip III M.D.   On: 12/23/2014 13:35   Mr Brain Wo Contrast  12/23/2014   CLINICAL DATA:  Acute encephalopathy. One week history of functional decline  EXAM: MRI HEAD WITHOUT CONTRAST  TECHNIQUE: Multiplanar, multiecho pulse sequences of the brain and surrounding structures were obtained without intravenous contrast.  COMPARISON:  CT head 12/23/2014  FINDINGS: Acute infarct left external capsule.  No other acute infarct  Moderate to advanced chronic microvascular ischemic change throughout the cerebral white matter and pons.  Negative for intracranial hemorrhage. Negative for mass or edema. No shift of the midline structures. Negative for hydrocephalus.  IMPRESSION: Acute infarct left external capsule  Atrophy and extensive chronic microvascular ischemic change.   Electronically Signed   By: Franchot Gallo M.D.   On: 12/23/2014 21:24    Scheduled Meds: . enoxaparin (LOVENOX) injection  30 mg Subcutaneous Q24H  . losartan  50 mg Oral Daily  . metoprolol tartrate  25 mg Oral BID  . pravastatin  20 mg Oral Daily   Continuous Infusions:      Time spent: 35 minutes    [facility] A  [facility] Pager [phone] If 7PM-7AM, please contact night-coverage at [email or web address], password [facility] 12/24/2014, 11:03 AM  LOS: 1 day

## 2014-12-24 NOTE — Evaluation (Signed)
Physical Therapy Evaluation Patient Details Name: Rebecca Knight MRN: [id number] DOB: Oct 13, 1922 Today's Date: 12/24/2014   History of Present Illness  Rebecca Knight is a 81 y.o. female with a past medical history of hypertension, dyslipidemia, referred to the emergency department by her primary care physician for further workup of confusion. At baseline she is highly active, independent on activities of daily living and instrumental activities of daily living. Over the past week she has been mildly confused, disoriented, saw her primary care provider who diagnosed with UTI and was given antibiotic therapy with Bactrim. Family members reporting that she is unsteady on her feet appears to have a generalized weakness. 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  Clinical Impression  Pt admitted with above diagnosis. Pt currently with functional limitations due to the deficits listed below (see PT Problem List). Pt with balance deficits as well as decreased insight into deficits which is currently placing he at high fall risk.  Pt will benefit from skilled PT to increase their independence and safety with mobility to allow discharge to the venue listed below.       Follow Up Recommendations Home health PT;Supervision for mobility/OOB    Equipment Recommendations  None recommended by PT    Recommendations for Other Services OT consult     Precautions / Restrictions Precautions Precautions: Fall Restrictions Weight Bearing Restrictions: No      Mobility  Bed Mobility Overal bed mobility: Modified Independent                Transfers Overall transfer level: Needs assistance Equipment used: None Transfers: Sit to/from Stand Sit to Stand: Supervision         General transfer comment: pt unsteady with initial standing and reaches for stable surfaces but did not lose balance. Close supervision given  Ambulation/Gait Ambulation/Gait assistance: Min assist Ambulation  Distance (Feet): 200 Feet Assistive device: Rolling walker (2 wheeled);None Gait Pattern/deviations: Step-through pattern;Staggering left;Staggering right Gait velocity: appropriate for age Gait velocity interpretation: at or above normal speed for age/gender General Gait Details: pt unused to RW and at first pushed it to side in an effort to get to bathroom quickly so given min HHA to bathroom. Pt unsteady, bumped door jamb and took several sidesteps. After using bathroom, instructed pt in use of RW and pt able to ambulate with min-guard A  200'  Stairs            Wheelchair Mobility    Modified Rankin (Stroke Patients Only) Modified Rankin (Stroke Patients Only) Pre-Morbid Rankin Score: No significant disability Modified Rankin: Moderately severe disability     Balance Overall balance assessment: Needs assistance Sitting-balance support: No upper extremity supported Sitting balance-Leahy Scale: Good     Standing balance support: No upper extremity supported Standing balance-Leahy Scale: Fair Standing balance comment: pt can maintain static standing without UE support but reaches for stable surfaces as soon as she begins to move                             Pertinent Vitals/Pain Pain Assessment: No/denies pain    Home Living Family/patient expects to be discharged to:: Assisted living               Home Equipment: Walker - 2 wheels;Cane - single point Additional Comments: pt lives at [facility] and does not use AD aty baseline, very active    Prior Function Level of Independence: Independent  Comments: pt reports that she has a cane and walker and may need to start using them because she has not been very steady lately     Hand Dominance        Extremity/Trunk Assessment   Upper Extremity Assessment: Defer to OT evaluation           Lower Extremity Assessment: Difficult to assess due to impaired cognition;RLE  deficits/detail;LLE deficits/detail RLE Deficits / Details: difficult to assess because pt had trouble following commands for MMT, but generally 4/5 strength noted bilaterally. No noted difference between left and right LLE Deficits / Details: same as RLE  Cervical / Trunk Assessment: Kyphotic  Communication   Communication: No difficulties  Cognition Arousal/Alertness: Awake/alert Behavior During Therapy: WFL for tasks assessed/performed Overall Cognitive Status: Impaired/Different from baseline Area of Impairment: Memory;Following commands;Problem solving;Safety/judgement;Awareness     Memory: Decreased short-term memory Following Commands: Follows multi-step commands inconsistently;Follows multi-step commands with increased time;Follows one step commands consistently Safety/Judgement: Decreased awareness of deficits;Decreased awareness of safety Awareness: Emergent Problem Solving: Slow processing;Difficulty sequencing;Requires verbal cues;Requires tactile cues General Comments: pt does not seem to recognize balance deficits fully, is slow to respond to questions, and has a difficult time giving a complete history of recent events    General Comments      Exercises        Assessment/Plan    PT Assessment Patient needs continued PT services  PT Diagnosis Difficulty walking;Abnormality of gait   PT Problem List Decreased balance;Decreased coordination;Decreased mobility;Decreased cognition;Decreased knowledge of use of DME;Decreased safety awareness;Decreased knowledge of precautions  PT Treatment Interventions DME instruction;Gait training;Functional mobility training;Therapeutic activities;Therapeutic exercise;Balance training;Cognitive remediation;Patient/family education   PT Goals (Current goals can be found in the Care Plan section) Acute Rehab PT Goals Patient Stated Goal: return to [facility] PT Goal Formulation: With patient Time For Goal Achievement:  01/07/15 Potential to Achieve Goals: Good    Frequency Min 4X/week   Barriers to discharge        Co-evaluation               End of Session Equipment Utilized During Treatment: Gait belt Activity Tolerance: Patient tolerated treatment well Patient left: in chair;with chair alarm set;with call bell/phone within reach Nurse Communication: Mobility status         Time: 4270-6237 PT Time Calculation (min) (ACUTE ONLY): 34 min   Charges:   PT Evaluation $Initial PT Evaluation Tier I: 1 Procedure PT Treatments $Gait Training: 8-22 mins   PT G Codes:      Leighton Roach, PT  Acute Rehab Services  [place], [place] 12/24/2014, 1:15 PM

## 2014-12-24 NOTE — Evaluation (Signed)
Occupational Therapy Evaluation and Discharge Patient Details Name: Rebecca Knight MRN: [id number] DOB: [birth year] Today's Date: 12/24/2014    History of Present Illness Rebecca Knight is a 81 y.o. female with a past medical history of HTN, dyslipidemia, referred to the emergency department by her primary care physician for further workup of confusion. At baseline she is highly active, independent for BADLS/IADLsactivities of daily. Over the past week she has been mildly confused, disoriented, saw her primary care provider who diagnosed with UTI and was given antibiotic therapy with Bactrim. Family members reporting that she is unsteady on her feet appears to have a generalized weakness. 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   Clinical Impression   This 81 yo female who is normally very active (including driving) presents to acute OT with mild balance deficits thus requiring the need for an AD to make her safer for ambulation at the moment. She will benefit from follow up [facility] to make sure she is functioning at an independent to Mod I level he ALF environment. Acute OT will sign off.    Follow Up Recommendations  Home health OT    Equipment Recommendations  None recommended by OT       Precautions / Restrictions Precautions Precautions: Fall Restrictions Weight Bearing Restrictions: No      Mobility Bed Mobility Overal bed mobility: Modified Independent                Transfers Overall transfer level: Needs assistance Equipment used: Rolling walker (2 wheeled) Transfers: Sit to/from Stand Sit to Stand: Supervision         General transfer comment: pt unsteady with initial standing and reaches for stable surfaces but did not lose balance. Close supervision given    Balance Overall balance assessment: Needs assistance Sitting-balance support: No upper extremity supported Sitting balance-Leahy Scale: Good     Standing balance support: No upper extremity  supported Standing balance-Leahy Scale: Fair Standing balance comment: pt can maintain static standing without UE support but reaches for stable surfaces as soon as she begins to move if she does not have RW with her.                            ADL Overall ADL's : Needs assistance/impaired Eating/Feeding: Independent;Sitting   Grooming: Supervision/safety;Standing   Upper Body Bathing: Supervision/ safety;Set up;Sitting;Standing   Lower Body Bathing: Supervison/ safety;Set up;Sit to/from stand   Upper Body Dressing : Supervision/safety;Set up;Sitting   Lower Body Dressing: Supervision/safety;Set up;Sit to/from stand   Toilet Transfer: Supervision/safety;RW;Ambulation;Comfort height toilet;Grab bars   Toileting- Water quality scientist and Hygiene: Supervision/safety;Sit to/from stand               Vision Vision Assessment?: Yes Eye Alignment: Within Functional Limits Ocular Range of Motion: Within Functional Limits Alignment/Gaze Preference: Within Defined Limits Tracking/Visual Pursuits: Able to track stimulus in all quads without difficulty Convergence: Within functional limits Visual Fields: No apparent deficits Additional Comments: No change from baseline, does use glasses to read normally, but could read large print with me without issues      \     Pertinent Vitals/Pain Pain Assessment: No/denies pain     Hand Dominance Right   Extremity/Trunk Assessment Upper Extremity Assessment Upper Extremity Assessment: Overall WFL for tasks assessed     Communication Communication Communication: HOH   Cognition Arousal/Alertness: Awake/alert Behavior During Therapy: WFL for tasks assessed/performed Overall Cognitive Status: Impaired/Different from baseline Area  of Impairment: Safety/judgement     Safety/Judgement: Decreased awareness of safety;Decreased awareness of deficits General Comments: Pt asking me to not hold on to her, "if you want me walk,  then let me do it"' I did without AD and at one point she had a mild loss of balance that she did self correct; however when I had her use the RW she did not have any LOB (advised her she should use her RW at the ALF)              Home Living Family/patient expects to be discharged to:: Assisted living                             Home Equipment: Walker - 2 wheels;Cane - single point;Shower seat;Bedside commode;Grab bars - tub/shower;Hand held shower head   Additional Comments: pt lives at [facility] and does not use AD aty baseline, very active      Prior Functioning/Environment Level of Independence: Independent (except the ALF provides 3 meals a day)        Comments: still drives--advised her not to drive until she follows up with primary MD and he clears her to drive    OT Diagnosis: Generalized weakness;Cognitive deficits   OT Problem List: Impaired balance (sitting and/or standing);Decreased cognition      OT Goals(Current goals can be found in the care plan section) Acute Rehab OT Goals Patient Stated Goal: return to [facility]  OT Frequency:                End of Session Equipment Utilized During Treatment: Rolling walker  Activity Tolerance: Patient tolerated treatment well Patient left: in bed;with call bell/phone within reach;with bed alarm set;with family/visitor present   Time: 0100-7121 OT Time Calculation (min): 26 min Charges:  OT General Charges $OT Visit: 1 Procedure OT Evaluation $Initial OT Evaluation Tier I: 1 Procedure OT Treatments $Self Care/Home Management : 8-22 mins  Almon Register [phone] 12/24/2014, 3:56 PM

## 2014-12-24 NOTE — Consult Note (Signed)
Referring Physician: Dr Zella Richer    Chief Complaint: mental state changes, weakness, fatigue, stroke on MRI brain  HPI:                                                                                                                                         Rebecca Knight is an 81 y.o. female with a past medical history that is pertinent for HTN, hyperlipidemia, AAA, herpes zoster left eye complicated by post herpetic neuralgia, colorectal cancer s/p resection, who was readmitted to the hospital on 9/2 due to the above mentioned symptoms. Patient can not give me details of why she is in the hospital, but review of her medical record indicates that she was " referred to the emergency department by her primary care physician for further workup of confusion. At baseline she is highly active, independent on activities of daily living and instrumental activities of daily living. Over the past week she has been mildly confused, disoriented, saw her primary care provider who diagnosed with UTI and was given antibiotic therapy with Bactrim. Family members reporting that she is unsteady on her feet appears to have a generalized weakness. She denies focal neurological deficits, recent falls, fevers, chills, nausea, vomiting, abdominal pain or dysuria". Work up in the hospital includes a brain MRI that I personally reviewed and demonstrated and acute infarct involving left external capsule. Presently, she is resting comfortably in bed and denies HA, vertigo, double vision, focal weakness or numbness,surred speech, language or vision impairment. Reviewed serologies significant for Cr 1.11, normal white count, B12 585, TSH 0.99, UDS negative, UA without infection.  Date last known well: unable to determine Time last known well: unable to deteermine tPA Given: no, late presentation    Past Medical History  Diagnosis Date  . Hypertension   . Hyperlipidemia   . Heart murmur   . Arthritis   . AAA (abdominal  aortic aneurysm)     [name]  . Varicose veins   . Anxiety   . Osteoporosis   . History of colon cancer 1995    Dr Lennie Hummer  . Herpes zoster of eye 2002    Dr. Herbert Deaner  . Post herpetic neuralgia   . Diverticulosis of colon   . Calculus of gallbladder without mention of cholecystitis or obstruction   . Cancer     Colon and Rectal    Past Surgical History  Procedure Laterality Date  . Tonsillectomy    . Colon surgery      RESECTION in 1990s  . Eye surgery      CAT EXT  OU  . Endovascular stent insertion  08/08/2011    Procedure: ENDOVASCULAR STENT GRAFT INSERTION;  Surgeon: [name], MD;  Location: [facility] OR;  Service: Vascular;  Laterality: N/A;  Endo Vascular Aortic stent placement with gore    Family History  Problem Relation Age of Onset  .  Hypertension Father   . Heart attack Father 77  . Diabetes Sister   . Heart attack Sister 48     her twin  . Diabetes Brother   . Heart attack Brother     in 92s  . Anesthesia problems Neg Hx   . Hypotension Neg Hx   . Malignant hyperthermia Neg Hx   . Pseudochol deficiency Neg Hx   . Brain cancer Daughter   . Stroke Mother     > 84   Social History:  reports that she has never smoked. She has never used smokeless tobacco. She reports that she does not drink alcohol or use illicit drugs. Family history: patient is confused and can not reliably be obtained. Allergies:  Allergies  Allergen Reactions  . Penicillins Rash    Rash     Medications:                                                                                                                           Scheduled: . enoxaparin (LOVENOX) injection  30 mg Subcutaneous Q24H  . losartan  50 mg Oral Daily  . metoprolol tartrate  25 mg Oral BID  . pravastatin  20 mg Oral Daily    ROS: patient is confused and can not reliably be obtained.                                                                                                                           History obtained from chart review and the patient     Physical exam:  Constitutional: well developed, pleasant female in no apparent distress. Blood pressure 159/89, pulse 75, temperature 98.3 F (36.8 C), temperature source Oral, resp. rate 15, SpO2 99 %. Eyes: no jaundice or exophthalmos.  Head: normocephalic. Neck: supple, no bruits, no JVD. Cardiac: no murmurs. Lungs: clear. Abdomen: soft, no tender, no mass. Extremities: no edema, clubbing, or cyanosis.  Skin: no rash  Neurologic Examination:  General: Mental Status: Alert, oriented, thought content appropriate.  Speech fluent without evidence of aphasia.  Able to follow 3 step commands without difficulty. Cranial Nerves: II: Discs flat bilaterally; Visual fields grossly normal, pupils equal, round, reactive to light and accommodation III,IV, VI: ptosis not present, extra-ocular motions intact bilaterally V,VII: smile symmetric, facial light touch sensation normal bilaterally VIII: hearing normal bilaterally IX,X: uvula rises symmetrically XI: bilateral shoulder shrug XII: midline tongue extension without atrophy or fasciculations Motor: Moves all; limbs symmetrically Tone and bulk:normal tone throughout; no atrophy noted Sensory: Pinprick and light touch intact throughout, bilaterally Deep Tendon Reflexes:  1+ all over Plantars: Right: downgoing   Left: downgoing Cerebellar: normal finger-to-nose,  heel-to-shin was not tested Gait:  No tested due to multiple leads    Results for orders placed or performed during the hospital encounter of 12/23/14 (from the past 48 hour(s))  Ethanol     Status: None   Collection Time: 12/23/14 12:00 PM  Result Value Ref Range   Alcohol, Ethyl (B) <5 <5 mg/dL    Comment:        LOWEST DETECTABLE LIMIT FOR SERUM ALCOHOL IS 5 mg/dL FOR MEDICAL PURPOSES ONLY   Protime-INR      Status: Abnormal   Collection Time: 12/23/14 12:00 PM  Result Value Ref Range   Prothrombin Time 15.4 (H) 11.6 - 15.2 seconds   INR 1.21 0.00 - 1.49  APTT     Status: None   Collection Time: 12/23/14 12:00 PM  Result Value Ref Range   aPTT 31 24 - 37 seconds  CBC     Status: None   Collection Time: 12/23/14 12:00 PM  Result Value Ref Range   WBC 6.9 4.0 - 10.5 K/uL   RBC 4.95 3.87 - 5.11 MIL/uL   Hemoglobin 14.2 12.0 - 15.0 g/dL   HCT 42.3 36.0 - 46.0 %   MCV 85.5 78.0 - 100.0 fL   MCH 28.7 26.0 - 34.0 pg   MCHC 33.6 30.0 - 36.0 g/dL   RDW 13.6 11.5 - 15.5 %   Platelets 183 150 - 400 K/uL  Differential     Status: None   Collection Time: 12/23/14 12:00 PM  Result Value Ref Range   Neutrophils Relative % 70 43 - 77 %   Neutro Abs 4.8 1.7 - 7.7 K/uL   Lymphocytes Relative 21 12 - 46 %   Lymphs Abs 1.5 0.7 - 4.0 K/uL   Monocytes Relative 8 3 - 12 %   Monocytes Absolute 0.6 0.1 - 1.0 K/uL   Eosinophils Relative 1 0 - 5 %   Eosinophils Absolute 0.1 0.0 - 0.7 K/uL   Basophils Relative 0 0 - 1 %   Basophils Absolute 0.0 0.0 - 0.1 K/uL  Comprehensive metabolic panel     Status: Abnormal   Collection Time: 12/23/14 12:00 PM  Result Value Ref Range   Sodium 135 135 - 145 mmol/L   Potassium 4.3 3.5 - 5.1 mmol/L   Chloride 100 (L) 101 - 111 mmol/L   CO2 27 22 - 32 mmol/L   Glucose, Bld 100 (H) 65 - 99 mg/dL   BUN 10 6 - 20 mg/dL   Creatinine, Ser 1.20 (H) 0.44 - 1.00 mg/dL   Calcium 10.1 8.9 - 10.3 mg/dL   Total Protein 6.6 6.5 - 8.1 g/dL   Albumin 3.9 3.5 - 5.0 g/dL   AST 26 15 - 41 U/L   ALT 13 (L) 14 - 54 U/L  Alkaline Phosphatase 51 38 - 126 U/L   Total Bilirubin 0.7 0.3 - 1.2 mg/dL   GFR calc non Af Amer 39 (L) >60 mL/min   GFR calc Af Amer 45 (L) >60 mL/min    Comment: (NOTE) The eGFR has been calculated using the CKD EPI equation. This calculation has not been validated in all clinical situations. eGFR's persistently <60 mL/min signify possible Chronic  Kidney Disease.    Anion gap 8 5 - 15  I-stat troponin, ED (not at [facility], [facility])     Status: None   Collection Time: 12/23/14 12:11 PM  Result Value Ref Range   Troponin i, poc 0.00 0.00 - 0.08 ng/mL   Comment 3            Comment: Due to the release kinetics of cTnI, a negative result within the first hours of the onset of symptoms does not rule out myocardial infarction with certainty. If myocardial infarction is still suspected, repeat the test at appropriate intervals.   I-Stat Chem 8, ED  (not at [facility], [facility])     Status: Abnormal   Collection Time: 12/23/14 12:13 PM  Result Value Ref Range   Sodium 137 135 - 145 mmol/L   Potassium 4.1 3.5 - 5.1 mmol/L   Chloride 97 (L) 101 - 111 mmol/L   BUN 16 6 - 20 mg/dL   Creatinine, Ser 1.20 (H) 0.44 - 1.00 mg/dL   Glucose, Bld 95 65 - 99 mg/dL   Calcium, Ion 1.32 (H) 1.13 - 1.30 mmol/L   TCO2 30 0 - 100 mmol/L   Hemoglobin 15.0 12.0 - 15.0 g/dL   HCT 44.0 36.0 - 46.0 %  Urine rapid drug screen (hosp performed)not at [facility]     Status: None   Collection Time: 12/23/14 12:51 PM  Result Value Ref Range   Opiates NONE DETECTED NONE DETECTED   Cocaine NONE DETECTED NONE DETECTED   Benzodiazepines NONE DETECTED NONE DETECTED   Amphetamines NONE DETECTED NONE DETECTED   Tetrahydrocannabinol NONE DETECTED NONE DETECTED   Barbiturates NONE DETECTED NONE DETECTED    Comment:        DRUG SCREEN FOR MEDICAL PURPOSES ONLY.  IF CONFIRMATION IS NEEDED FOR ANY PURPOSE, NOTIFY LAB WITHIN 5 DAYS.        LOWEST DETECTABLE LIMITS FOR URINE DRUG SCREEN Drug Class       Cutoff (ng/mL) Amphetamine      1000 Barbiturate      200 Benzodiazepine   017 Tricyclics       494 Opiates          300 Cocaine          300 THC              50   Urinalysis, Routine w reflex microscopic (not at [facility])     Status: None   Collection Time: 12/23/14 12:51 PM  Result Value Ref Range   Color, Urine YELLOW YELLOW   APPearance CLEAR CLEAR   Specific Gravity, Urine  1.007 1.005 - 1.030   pH 7.0 5.0 - 8.0   Glucose, UA NEGATIVE NEGATIVE mg/dL   Hgb urine dipstick NEGATIVE NEGATIVE   Bilirubin Urine NEGATIVE NEGATIVE   Ketones, ur NEGATIVE NEGATIVE mg/dL   Protein, ur NEGATIVE NEGATIVE mg/dL   Urobilinogen, UA 0.2 0.0 - 1.0 mg/dL   Nitrite NEGATIVE NEGATIVE   Leukocytes, UA NEGATIVE NEGATIVE    Comment: MICROSCOPIC NOT DONE ON URINES WITH NEGATIVE PROTEIN, BLOOD, LEUKOCYTES, NITRITE, OR GLUCOSE <  1000 mg/dL.  CBG monitoring, ED     Status: None   Collection Time: 12/23/14 12:59 PM  Result Value Ref Range   Glucose-Capillary 85 65 - 99 mg/dL  Vitamin B12     Status: None   Collection Time: 12/23/14  7:09 PM  Result Value Ref Range   Vitamin B-12 585 180 - 914 pg/mL    Comment: (NOTE) This assay is not validated for testing neonatal or myeloproliferative syndrome specimens for Vitamin B12 levels.   TSH     Status: None   Collection Time: 12/23/14  7:09 PM  Result Value Ref Range   TSH 0.955 0.350 - 4.500 uIU/mL  Folate     Status: None   Collection Time: 12/23/14  7:09 PM  Result Value Ref Range   Folate 22.6 >5.9 ng/mL  Sedimentation rate     Status: None   Collection Time: 12/23/14  7:09 PM  Result Value Ref Range   Sed Rate 2 0 - 22 mm/hr  Basic metabolic panel     Status: Abnormal   Collection Time: 12/24/14  4:31 AM  Result Value Ref Range   Sodium 136 135 - 145 mmol/L   Potassium 3.5 3.5 - 5.1 mmol/L   Chloride 100 (L) 101 - 111 mmol/L   CO2 26 22 - 32 mmol/L   Glucose, Bld 101 (H) 65 - 99 mg/dL   BUN 9 6 - 20 mg/dL   Creatinine, Ser 1.11 (H) 0.44 - 1.00 mg/dL   Calcium 9.6 8.9 - 10.3 mg/dL   GFR calc non Af Amer 42 (L) >60 mL/min   GFR calc Af Amer 49 (L) >60 mL/min    Comment: (NOTE) The eGFR has been calculated using the CKD EPI equation. This calculation has not been validated in all clinical situations. eGFR's persistently <60 mL/min signify possible Chronic Kidney Disease.    Anion gap 10 5 - 15  CBC     Status: None    Collection Time: 12/24/14  4:31 AM  Result Value Ref Range   WBC 5.5 4.0 - 10.5 K/uL   RBC 4.80 3.87 - 5.11 MIL/uL   Hemoglobin 13.8 12.0 - 15.0 g/dL   HCT 41.6 36.0 - 46.0 %   MCV 86.7 78.0 - 100.0 fL   MCH 28.8 26.0 - 34.0 pg   MCHC 33.2 30.0 - 36.0 g/dL   RDW 13.7 11.5 - 15.5 %   Platelets 160 150 - 400 K/uL  Lipid panel     Status: None   Collection Time: 12/24/14 11:30 AM  Result Value Ref Range   Cholesterol 123 0 - 200 mg/dL   Triglycerides 106 <150 mg/dL   HDL 46 >40 mg/dL   Total CHOL/HDL Ratio 2.7 RATIO   VLDL 21 0 - 40 mg/dL   LDL Cholesterol 56 0 - 99 mg/dL    Comment:        Total Cholesterol/HDL:CHD Risk Coronary Heart Disease Risk Table                     Men   Women  1/2 Average Risk   3.4   3.3  Average Risk       5.0   4.4  2 X Average Risk   9.6   7.1  3 X Average Risk  23.4   11.0        Use the calculated Patient Ratio above and the CHD Risk Table to determine the patient's CHD Risk.  ATP III CLASSIFICATION (LDL):  <100     mg/dL   Optimal  100-129  mg/dL   Near or Above                    Optimal  130-159  mg/dL   Borderline  160-189  mg/dL   High  >190     mg/dL   Very High    Ct Head Wo Contrast  12/23/2014   CLINICAL DATA:  Gait disturbance and altered mental status ; lethargy  EXAM: CT HEAD WITHOUT CONTRAST  TECHNIQUE: Contiguous axial images were obtained from the base of the skull through the vertex without intravenous contrast.  COMPARISON:  July 21, 2005  FINDINGS: There is mild diffuse atrophy. There is no intracranial mass, hemorrhage, extra-axial fluid collection, or midline shift. There is small vessel disease throughout the centra semiovale which has progressed compared to prior study. There is small vessel disease throughout the left internal and external capsule regions as well as in the anterior limb of the right external capsule. There is evidence of a prior small lacunar infarct in the anterior right lentiform nucleus. There  is evidence of a prior small lacunar infarct in the posterior limb of the left internal capsule. There is small vessel disease in the thalamic regions bilaterally. No acute infarct is demonstrable on this study. The bony calvarium appears intact. The mastoid air cells are clear.  IMPRESSION: Atrophy with fairly extensive supratentorial small vessel disease. No intracranial mass, hemorrhage, or acute appearing infarct.   Electronically Signed   By: Lowella Grip III M.D.   On: 12/23/2014 13:35   Mr Brain Wo Contrast  12/23/2014   CLINICAL DATA:  Acute encephalopathy. One week history of functional decline  EXAM: MRI HEAD WITHOUT CONTRAST  TECHNIQUE: Multiplanar, multiecho pulse sequences of the brain and surrounding structures were obtained without intravenous contrast.  COMPARISON:  CT head 12/23/2014  FINDINGS: Acute infarct left external capsule.  No other acute infarct  Moderate to advanced chronic microvascular ischemic change throughout the cerebral white matter and pons.  Negative for intracranial hemorrhage. Negative for mass or edema. No shift of the midline structures. Negative for hydrocephalus.  IMPRESSION: Acute infarct left external capsule  Atrophy and extensive chronic microvascular ischemic change.   Electronically Signed   By: Franchot Gallo M.D.   On: 12/23/2014 21:24     Assessment: 81 y.o. female with confusion, disorientation, weakness, fatigue, functional decline over the past week. No obvious medical cause for this presentation,, but MRI revealed an acute infarct left external capsule. Wonder if she could have an underlying mild cognitive disorder that had worsened due to this acute infarct. Recommend low dose aspirin, limited stroke work up. Stroke teal will follow up tomorrow.  Stroke Risk Factors - age,  HTN, hyperlipidemia  Plan: 1. HgbA1c, fasting lipid panel 2. MRI, MRA  of the brain without contrast 3. Echocardiogram 4. Prophylactic therapy-aspirin 6. Risk factor  modification 7. Telemetry monitoring 8. Frequent neuro checks 9. PT/OT SLP 10. NPO until passing swallowing eval  Dorian Pod, MD [facility] [phone]  12/24/2014, 1:21 PM

## 2014-12-25 ENCOUNTER — Inpatient Hospital Stay (HOSPITAL_COMMUNITY): Payer: Medicare Other

## 2014-12-25 DIAGNOSIS — I6339 Cerebral infarction due to thrombosis of other cerebral artery: Secondary | ICD-10-CM

## 2014-12-25 DIAGNOSIS — I6789 Other cerebrovascular disease: Secondary | ICD-10-CM

## 2014-12-25 DIAGNOSIS — I6330 Cerebral infarction due to thrombosis of unspecified cerebral artery: Secondary | ICD-10-CM | POA: Diagnosis present

## 2014-12-25 LAB — BASIC METABOLIC PANEL
Anion gap: 7 (ref 5–15)
BUN: 8 mg/dL (ref 6–20)
CHLORIDE: 101 mmol/L (ref 101–111)
CO2: 28 mmol/L (ref 22–32)
CREATININE: 1.06 mg/dL — AB (ref 0.44–1.00)
Calcium: 9.9 mg/dL (ref 8.9–10.3)
GFR calc Af Amer: 52 mL/min — ABNORMAL LOW (ref >60)
GFR calc non Af Amer: 45 mL/min — ABNORMAL LOW (ref >60)
Glucose, Bld: 117 mg/dL — ABNORMAL HIGH (ref 65–99)
Potassium: 4.3 mmol/L (ref 3.5–5.1)
Sodium: 136 mmol/L (ref 135–145)

## 2014-12-25 NOTE — Progress Notes (Signed)
Echocardiogram 2D Echocardiogram has been performed.  Tresa Res 12/25/2014, 9:44 AM

## 2014-12-25 NOTE — Progress Notes (Signed)
Physical Therapy Treatment Patient Details Name: Rebecca Knight MRN: [id number] DOB: 1923/01/09 Today's Date: 12/25/2014    History of Present Illness Rebecca Knight is a 81 y.o. female with a past medical history of HTN, dyslipidemia, referred to the emergency department by her primary care physician for further workup of confusion. At baseline she is highly active, independent for BADLS/IADLsactivities of daily. Over the past week she has been mildly confused, disoriented, saw her primary care provider who diagnosed with UTI and was given antibiotic therapy with Bactrim. Family members reporting that she is unsteady on her feet appears to have a generalized weakness. 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    PT Comments    Pt con't to present with confusion, impulsivity, decreased insight to deficits and impaired balance indicating and increased falls risk. Pt requires 24/7 supervision for safe mobility due to pt being a falls risk with both the RW and without the RW.   Follow Up Recommendations  Home health PT;Supervision for mobility/OOB     Equipment Recommendations  Rolling walker with 5" wheels (for long distance amb)    Recommendations for Other Services       Precautions / Restrictions Precautions Precautions: Fall Precaution Comments: impulsive Restrictions Weight Bearing Restrictions: No    Mobility  Bed Mobility Overal bed mobility: Modified Independent                Transfers Overall transfer level: Needs assistance Equipment used: Rolling walker (2 wheeled) Transfers: Sit to/from Stand Sit to Stand: Supervision         General transfer comment: pt unsteady with and without RW due to sense of urgency due to urinary incontinence. Pt required minA to prevent tripping over RW and also to amb safely to bathroom without AD as well. max v/c's to reach back for seat and to push up from bed/toliet  Ambulation/Gait Ambulation/Gait assistance: Min  guard Ambulation Distance (Feet): 200 Feet Assistive device: Rolling walker (2 wheeled);None Gait Pattern/deviations: Step-through pattern;Decreased stride length;Narrow base of support;Staggering left;Staggering right Gait velocity: appropriate for age   General Gait Details: pt requires min guard for safe ambulation with RW and when amb without RW. due to pt not typically using a walker pt with poor walker management and poor carryover of v/c's for safey with RW especially during turning and when there is a sense of urgency for the bathroom. pt with no episodes of LOB but is unsteady and stagers left/right, pt prefers to hold onto someones hand however she doesn't have someone to do that 24/7   Stairs            Wheelchair Mobility    Modified Rankin (Stroke Patients Only) Modified Rankin (Stroke Patients Only) Pre-Morbid Rankin Score: No significant disability Modified Rankin: Moderately severe disability     Balance Overall balance assessment: Needs assistance         Standing balance support: No upper extremity supported Standing balance-Leahy Scale: Fair Standing balance comment: pt able to wash hands at sink without LOB of balance                    Cognition Arousal/Alertness: Awake/alert Behavior During Therapy: Impulsive Overall Cognitive Status: Impaired/Different from baseline Area of Impairment: Safety/judgement;Memory;Attention   Current Attention Level: Sustained Memory: Decreased short-term memory Following Commands: Follows multi-step commands with increased time   Awareness: Emergent Problem Solving: Slow processing;Difficulty sequencing;Requires verbal cues;Requires tactile cues General Comments: pt becomes impulsive upon initially standing due  to urinary urgency requiring assist for safe ambulation to bathroom.     Exercises      General Comments General comments (skin integrity, edema, etc.): pt assisted to bathroom with minA. Pt  impulsive due to urgency however supervision for hygiene. pt cleaned up urine on floor with toliet paper but then required v/c's to indicate she needed to wash her hands and change her socks      Pertinent Vitals/Pain Pain Assessment: No/denies pain    Home Living                      Prior Function            PT Goals (current goals can now be found in the care plan section) Acute Rehab PT Goals Patient Stated Goal: go home Progress towards PT goals: Progressing toward goals    Frequency  Min 4X/week    PT Plan Current plan remains appropriate    Co-evaluation             End of Session Equipment Utilized During Treatment: Gait belt Activity Tolerance: Patient tolerated treatment well Patient left: in chair;with chair alarm set;with call bell/phone within reach     Time: 1453-1520 PT Time Calculation (min) (ACUTE ONLY): 27 min  Charges:  $Gait Training: 23-37 mins                    G Codes:      Kingsley Callander 12/25/2014, 4:09 PM   Kittie Plater, PT, DPT Pager #: [phone] Office #: [phone]

## 2014-12-25 NOTE — Progress Notes (Signed)
STROKE TEAM PROGRESS NOTE  HPI  Rebecca Knight is a 81 y.o. female with a past medical history that is pertinent for HTN, hyperlipidemia, AAA, herpes zoster left eye complicated by post herpetic neuralgia, colorectal cancer s/p resection, who was readmitted to the hospital on 9/2 due to mental status changes, weakness, and fatigue.  Patient can not give details of why she is in the hospital, but review of her medical record indicated that she was " referred to the emergency department by her primary care physician for further workup of confusion. At baseline she is highly active, independent on activities of daily living and instrumental activities of daily living. Over the past week she has been mildly confused, disoriented, saw her primary care provider who diagnosed with UTI and was given antibiotic therapy with Bactrim. Family members reporting that she is unsteady on her feet appears to have a generalized weakness. She denied focal neurological deficits, recent falls, fevers, chills, nausea, vomiting, abdominal pain or dysuria".  Work up in the hospital included a brain MRI that I personally reviewed and demonstrated and acute infarct involving left external capsule.    Date last known well: unable to determine Time last known well: unable to deteermine tPA Given: no, late presentation   SUBJECTIVE (INTERVAL HISTORY) Her family was not at the beside.  Overall  she feels her condition is improved   OBJECTIVE Temp:  [97.8 F (36.6 C)-98.3 F (36.8 C)] 98 F (36.7 C) (09/04 0535) Pulse Rate:  [69-81] 80 (09/04 0600) Cardiac Rhythm:  [-] Normal sinus rhythm;Heart block (09/03 1900) Resp:  [15-18] 16 (09/04 0535) BP: (138-200)/(77-89) 159/81 mmHg (09/04 0600) SpO2:  [97 %-99 %] 98 % (09/04 0535) Weight:  [51.4 kg (113 lb 5.1 oz)] 51.4 kg (113 lb 5.1 oz) (09/03 2000)  CBC:  Recent Labs Lab 12/23/14 1200 12/23/14 1213 12/24/14 0431  WBC 6.9  --  5.5  NEUTROABS 4.8  --   --   HGB 14.2  15.0 13.8  HCT 42.3 44.0 41.6  MCV 85.5  --  86.7  PLT 183  --  627    Basic Metabolic Panel:  Recent Labs Lab 12/24/14 0431 12/25/14 0625  NA 136 136  K 3.5 4.3  CL 100* 101  CO2 26 28  GLUCOSE 101* 117*  BUN 9 8  CREATININE 1.11* 1.06*  CALCIUM 9.6 9.9    Lipid Panel:    Component Value Date/Time   CHOL 123 12/24/2014 1130   TRIG 106 12/24/2014 1130   HDL 46 12/24/2014 1130   CHOLHDL 2.7 12/24/2014 1130   VLDL 21 12/24/2014 1130   LDLCALC 56 12/24/2014 1130   HgbA1c:  Lab Results  Component Value Date   HGBA1C 5.7 06/16/2008   Urine Drug Screen:    Component Value Date/Time   LABOPIA NONE DETECTED 12/23/2014 1251   COCAINSCRNUR NONE DETECTED 12/23/2014 1251   LABBENZ NONE DETECTED 12/23/2014 1251   AMPHETMU NONE DETECTED 12/23/2014 1251   THCU NONE DETECTED 12/23/2014 1251   LABBARB NONE DETECTED 12/23/2014 1251      IMAGING  Ct Head Wo Contrast 12/23/2014    Atrophy with fairly extensive supratentorial small vessel disease. No intracranial mass, hemorrhage, or acute appearing infarct.     Mr Brain Wo Contrast 12/23/2014    Acute infarct left external capsule  Atrophy and extensive chronic microvascular ischemic change.     MRA 12/25/2014 Moderate atrophy and moderate to advanced chronic ischemic change. No large vessel occlusion on MRA.  There is disease in the right superior cerebellar artery and the posterior cerebral artery bilaterally.   PHYSICAL EXAM Physical Exam General - Well nourished, well developed, in NAD   Cardiovascular - Regular rate and rhythm Pulmonary: CTA Abdomen: NT, ND, normal bowel sounds Extremities: No C/C/E  Neurological Exam Mental Status: Pleasantly confused Orientation:  Oriented to person, "hospital I think" and confused about time Speech:  Fluent; no dysarthria Cranial Nerves:  PERRL; EOMI; visual fields full, face grossly symmetric, hearing grossly decreased; shrug symmetric and tongue midline  Motor Exam:   Tone:  Within normal limits; Strength: 5/5 throughout Sensory: Intact to light touch throughout Coordination:  Intact finger to nose Gait: Deferred  ASSESSMENT/PLAN Rebecca Knight is a 81 y.o. female with history of hypertension, hyperlipidemia, abdominal aortic aneurysm, anxiety, history of colon and rectal cancer, and herpes zoster of the eye in 2002 with postherpetic neuralgia presenting with mild confusion with disorientation and generalized weakness She did not receive IV t-PA due to late presentation.  Stroke:  Dominant   Resultant mild confusion; unclear what is baseline  MRI  acute infarct of the left external capsule  MRA  no large vessel occlusion  Carotid Doppler pending  2D Echo  EF 60-65%. Atrial septal aneurysm but no cardiac source of emboli identified.  LDL 56  HgbA1c pending  Lovenox for VTE prophylaxis  Diet Heart Room service appropriate?: Yes; Fluid consistency:: Thin  no antithrombotic prior to admission, now on aspirin 81 mg orally every day  Patient counseled to be compliant with her antithrombotic medications  Ongoing aggressive stroke risk factor management  Therapy recommendations:  Pending  Disposition: Pending  Hypertension  Mildly high at times  Permissive hypertension (OK if < 220/120) but gradually normalize in 5-7 days  Hyperlipidemia  Home meds: Pravachol 20 mg daily resumed in hospital  LDL 56, goal < 70  Continue statin at discharge  Other Stroke Risk Factors  Advanced age  Family hx stroke (Mother)  Other Active Problems  Mildly elevated creatinine  Hospital day # 2  ATTENDING NOTE: Patient was seen and examined by me personally. Documentation accurately reflects findings. The laboratory and radiographic studies reviewed by me. ROS completed by me personally.  Patient was mildly confused but denied pain and any other pertinent positives.   Assessment and plan completed by me personally and fully documented  above. Plans include:  Follow-up on the remainder of stroke work-up Condition is improved since admission and patient apparently not as confused as the documented admission exam.   SIGNED BY: Dr. Elissa Hefty       To contact Stroke Continuity provider, please refer to [email or web address] After hours, contact General Neurology

## 2014-12-25 NOTE — Progress Notes (Signed)
[facility] PROGRESS NOTE   ESMEE FALLAW OEU:[id number] DOB: 15-Nov-1922 DOA: 12/23/2014 PCP: Rachell Cipro, MD  HPI/Subjective: Patient was eating this morning, denies any complaints. MRI of the brain showed stroke, all consult neurology.  Assessment/Plan: Principal Problem:   Acute encephalopathy Active Problems:   HYPERLIPIDEMIA   Essential hypertension   Cerebral thrombosis with cerebral infarction   Acute stroke Patient presented to the hospital with generalized weakness. Patient also mentioned she has had problems driving so she quit driving recently. MRI of the brain showed acute infarct involving the left external capsule. Neurology consulted. Stroke workup. 1. HgbA1c, fasting lipid panel 2. MRI, MRA of the brain without contrast 3. PT consult, OT consult, Speech consult 4. Echocardiogram 5. Carotid dopplers 6. Prophylactic therapy-Antiplatelet med: Aspirin  7. Risk factor modification 8. Telemetry monitoring  Hypertension Restarted home medications.  Hyperlipidemia Total cholesterol is 123, LDL is 56 at goal, continue current statin.  Code Status: DNR Family Communication: Plan discussed with the patient. Disposition Plan: Remains inpatient Diet: Diet Heart Room service appropriate?: Yes; Fluid consistency:: Thin  Consultants:  Neurology  Procedures:  None  Antibiotics:  None   Objective: Filed Vitals:   12/25/14 0600  BP: 159/81  Pulse: 80  Temp:   Resp:     Intake/Output Summary (Last 24 hours) at 12/25/14 1121 Last data filed at 12/25/14 0500  Gross per 24 hour  Intake      0 ml  Output      1 ml  Net     -1 ml   Filed Weights   12/24/14 2000  Weight: 51.4 kg (113 lb 5.1 oz)    Exam: General: Alert and awake, oriented x3, not in any acute distress. HEENT: anicteric sclera, pupils reactive to light and accommodation, EOMI CVS: S1-S2 clear, no murmur rubs or gallops Chest: clear to auscultation bilaterally, no  wheezing, rales or rhonchi Abdomen: soft nontender, nondistended, normal bowel sounds, no organomegaly Extremities: no cyanosis, clubbing or edema noted bilaterally Neuro: Cranial nerves II-XII intact, no focal neurological deficits  Data Reviewed: Basic Metabolic Panel:  Recent Labs Lab 12/23/14 1200 12/23/14 1213 12/24/14 0431 12/25/14 0625  NA 135 137 136 136  K 4.3 4.1 3.5 4.3  CL 100* 97* 100* 101  CO2 27  --  26 28  GLUCOSE 100* 95 101* 117*  BUN 10 16 9 8   CREATININE 1.20* 1.20* 1.11* 1.06*  CALCIUM 10.1  --  9.6 9.9   Liver Function Tests:  Recent Labs Lab 12/23/14 1200  AST 26  ALT 13*  ALKPHOS 51  BILITOT 0.7  PROT 6.6  ALBUMIN 3.9   No results for input(s): LIPASE, AMYLASE in the last 168 hours. No results for input(s): AMMONIA in the last 168 hours. CBC:  Recent Labs Lab 12/23/14 1200 12/23/14 1213 12/24/14 0431  WBC 6.9  --  5.5  NEUTROABS 4.8  --   --   HGB 14.2 15.0 13.8  HCT 42.3 44.0 41.6  MCV 85.5  --  86.7  PLT 183  --  160   Cardiac Enzymes: No results for input(s): CKTOTAL, CKMB, CKMBINDEX, TROPONINI in the last 168 hours. BNP (last 3 results) No results for input(s): BNP in the last 8760 hours.  ProBNP (last 3 results) No results for input(s): PROBNP in the last 8760 hours.  CBG:  Recent Labs Lab 12/23/14 1259  GLUCAP 85    Micro No results found for this or any previous visit (from the past 240 hour(s)).  Studies: Ct Head Wo Contrast  12/23/2014   CLINICAL DATA:  Gait disturbance and altered mental status ; lethargy  EXAM: CT HEAD WITHOUT CONTRAST  TECHNIQUE: Contiguous axial images were obtained from the base of the skull through the vertex without intravenous contrast.  COMPARISON:  July 21, 2005  FINDINGS: There is mild diffuse atrophy. There is no intracranial mass, hemorrhage, extra-axial fluid collection, or midline shift. There is small vessel disease throughout the centra semiovale which has progressed compared to  prior study. There is small vessel disease throughout the left internal and external capsule regions as well as in the anterior limb of the right external capsule. There is evidence of a prior small lacunar infarct in the anterior right lentiform nucleus. There is evidence of a prior small lacunar infarct in the posterior limb of the left internal capsule. There is small vessel disease in the thalamic regions bilaterally. No acute infarct is demonstrable on this study. The bony calvarium appears intact. The mastoid air cells are clear.  IMPRESSION: Atrophy with fairly extensive supratentorial small vessel disease. No intracranial mass, hemorrhage, or acute appearing infarct.   Electronically Signed   By: Lowella Grip III M.D.   On: 12/23/2014 13:35   Mr Jodene Nam Head Wo Contrast  12/25/2014   CLINICAL DATA:  Acute infarct. Confusion and weakness. Functional decline.  EXAM: MRI HEAD WITHOUT CONTRAST  MRA HEAD WITHOUT CONTRAST  TECHNIQUE: Multiplanar, multiecho pulse sequences of the brain and surrounding structures were obtained without intravenous contrast. Angiographic images of the head were obtained using MRA technique without contrast.  COMPARISON:  MRI head 12/23/2014  FINDINGS: MRI HEAD FINDINGS  Acute infarct left external capsule unchanged. This extends down to the subinsular white matter on the left also unchanged. No new area of acute infarct since the prior MRI 2 days ago.  Moderate to advanced chronic microvascular ischemic change throughout the cerebral white matter. Chronic ischemic change in the basal ganglia and thalamus and pons bilaterally.  Generalized atrophy. This is most prominent in the temporal lobes but also present in the frontal and parietal lobes.  Negative for hemorrhage or mass lesion. No edema or shift of the midline structures. No new findings. Pituitary normal in size.  Paranasal sinuses clear.  MRA HEAD FINDINGS  Both vertebral arteries patent to the basilar. Right PICA patent.  Left PICA not visualized. Basilar widely patent. Right AICA patent. Superior cerebellar artery and posterior cerebral artery appear diseased on the right but patent on the left.  Internal carotid artery patent bilaterally without significant stenosis. Anterior and middle cerebral arteries patent bilaterally without significant stenosis.  Negative for cerebral aneurysm.  IMPRESSION: Acute infarct left external capsule and subinsular white matter, unchanged from 2 days ago. No new infarct since that time.  Moderate atrophy and moderate to advanced chronic ischemic change.  No large vessel occlusion on MRA. There is disease in the right superior cerebellar artery and the posterior cerebral artery bilaterally.   Electronically Signed   By: Franchot Gallo M.D.   On: 12/25/2014 11:02   Mr Brain Wo Contrast  12/25/2014   CLINICAL DATA:  Acute infarct. Confusion and weakness. Functional decline.  EXAM: MRI HEAD WITHOUT CONTRAST  MRA HEAD WITHOUT CONTRAST  TECHNIQUE: Multiplanar, multiecho pulse sequences of the brain and surrounding structures were obtained without intravenous contrast. Angiographic images of the head were obtained using MRA technique without contrast.  COMPARISON:  MRI head 12/23/2014  FINDINGS: MRI HEAD FINDINGS  Acute infarct left external capsule unchanged.  This extends down to the subinsular white matter on the left also unchanged. No new area of acute infarct since the prior MRI 2 days ago.  Moderate to advanced chronic microvascular ischemic change throughout the cerebral white matter. Chronic ischemic change in the basal ganglia and thalamus and pons bilaterally.  Generalized atrophy. This is most prominent in the temporal lobes but also present in the frontal and parietal lobes.  Negative for hemorrhage or mass lesion. No edema or shift of the midline structures. No new findings. Pituitary normal in size.  Paranasal sinuses clear.  MRA HEAD FINDINGS  Both vertebral arteries patent to the basilar.  Right PICA patent. Left PICA not visualized. Basilar widely patent. Right AICA patent. Superior cerebellar artery and posterior cerebral artery appear diseased on the right but patent on the left.  Internal carotid artery patent bilaterally without significant stenosis. Anterior and middle cerebral arteries patent bilaterally without significant stenosis.  Negative for cerebral aneurysm.  IMPRESSION: Acute infarct left external capsule and subinsular white matter, unchanged from 2 days ago. No new infarct since that time.  Moderate atrophy and moderate to advanced chronic ischemic change.  No large vessel occlusion on MRA. There is disease in the right superior cerebellar artery and the posterior cerebral artery bilaterally.   Electronically Signed   By: Franchot Gallo M.D.   On: 12/25/2014 11:02   Mr Brain Wo Contrast  12/23/2014   CLINICAL DATA:  Acute encephalopathy. One week history of functional decline  EXAM: MRI HEAD WITHOUT CONTRAST  TECHNIQUE: Multiplanar, multiecho pulse sequences of the brain and surrounding structures were obtained without intravenous contrast.  COMPARISON:  CT head 12/23/2014  FINDINGS: Acute infarct left external capsule.  No other acute infarct  Moderate to advanced chronic microvascular ischemic change throughout the cerebral white matter and pons.  Negative for intracranial hemorrhage. Negative for mass or edema. No shift of the midline structures. Negative for hydrocephalus.  IMPRESSION: Acute infarct left external capsule  Atrophy and extensive chronic microvascular ischemic change.   Electronically Signed   By: Franchot Gallo M.D.   On: 12/23/2014 21:24    Scheduled Meds: . aspirin EC  81 mg Oral Daily  . enoxaparin (LOVENOX) injection  30 mg Subcutaneous Q24H  . losartan  50 mg Oral Daily  . metoprolol tartrate  25 mg Oral BID  . pravastatin  20 mg Oral Daily   Continuous Infusions:      Time spent: 35 minutes    [facility]  [facility] Pager  [phone] If 7PM-7AM, please contact night-coverage at [email or web address], password [facility] 12/25/2014, 11:21 AM  LOS: 2 days

## 2014-12-25 NOTE — Progress Notes (Signed)
Utilization Review Completed.Rebecca Knight T9/07/2014  

## 2014-12-26 ENCOUNTER — Encounter (HOSPITAL_COMMUNITY): Payer: Medicare Other

## 2014-12-26 DIAGNOSIS — E782 Mixed hyperlipidemia: Secondary | ICD-10-CM

## 2014-12-26 DIAGNOSIS — I6330 Cerebral infarction due to thrombosis of unspecified cerebral artery: Secondary | ICD-10-CM

## 2014-12-26 DIAGNOSIS — R55 Syncope and collapse: Secondary | ICD-10-CM

## 2014-12-26 DIAGNOSIS — I10 Essential (primary) hypertension: Secondary | ICD-10-CM

## 2014-12-26 DIAGNOSIS — I639 Cerebral infarction, unspecified: Principal | ICD-10-CM

## 2014-12-26 LAB — BASIC METABOLIC PANEL WITH GFR
Anion gap: 8 (ref 5–15)
BUN: 12 mg/dL (ref 6–20)
CO2: 28 mmol/L (ref 22–32)
Calcium: 10 mg/dL (ref 8.9–10.3)
Chloride: 99 mmol/L — ABNORMAL LOW (ref 101–111)
Creatinine, Ser: 1.13 mg/dL — ABNORMAL HIGH (ref 0.44–1.00)
GFR calc Af Amer: 48 mL/min — ABNORMAL LOW
GFR calc non Af Amer: 41 mL/min — ABNORMAL LOW
Glucose, Bld: 121 mg/dL — ABNORMAL HIGH (ref 65–99)
Potassium: 4.4 mmol/L (ref 3.5–5.1)
Sodium: 135 mmol/L (ref 135–145)

## 2014-12-26 MED ORDER — ASPIRIN 81 MG PO TBEC: 81.0000 mg | DELAYED_RELEASE_TABLET | Freq: Every day | ORAL | Status: DC

## 2014-12-26 NOTE — Progress Notes (Signed)
Physical Therapy Treatment Patient Details Name: LEZLIE RITCHEY MRN: [id number] DOB: 09-22-1922 Today's Date: 12/26/2014    History of Present Illness Rebecca Knight is a 81 y.o. female with a past medical history of HTN, dyslipidemia, referred to the emergency department by her primary care physician for further workup of confusion. At baseline she is highly active, independent for BADLS/IADLsactivities of daily. Over the past week she has been mildly confused, disoriented, saw her primary care provider who diagnosed with UTI and was given antibiotic therapy with Bactrim. Family members reporting that she is unsteady on her feet appears to have a generalized weakness. 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    PT Comments    Pt was suppose to be d/c'd back to ALF today but then had a syncopal episode. PT asked to re-assess. Pt demo'd improved function and cognition today and had no episodes of dizziness/syncope or LOB while ambulating. Pt more aware of deficits and desires to use RW for safe ambulation.   Follow Up Recommendations  Home health PT;Supervision for mobility/OOB     Equipment Recommendations  Rolling walker with 5" wheels    Recommendations for Other Services OT consult     Precautions / Restrictions Precautions Precautions: Fall Precaution Comments: had an episode of syncope haulting pt's d/c today Restrictions Weight Bearing Restrictions: No    Mobility  Bed Mobility Overal bed mobility: Modified Independent                Transfers Overall transfer level: Needs assistance Equipment used: Rolling walker (2 wheeled) Transfers: Sit to/from Stand Sit to Stand: Supervision         General transfer comment: v/c's for safe hand placement and use of RW  Ambulation/Gait Ambulation/Gait assistance: Supervision Ambulation Distance (Feet): 200 Feet Assistive device: Rolling walker (2 wheeled);None Gait Pattern/deviations: Step-through  pattern Gait velocity: appropriate for age   General Gait Details: pt supervision with RW and min guard without AD. Pt demo'd good use of RW. pt wit no episodse of dizziness during session or episodes of LOB   Stairs            Wheelchair Mobility    Modified Rankin (Stroke Patients Only) Modified Rankin (Stroke Patients Only) Pre-Morbid Rankin Score: No significant disability Modified Rankin: Moderate disability     Balance                                    Cognition Arousal/Alertness: Awake/alert Behavior During Therapy: WFL for tasks assessed/performed                   General Comments: improved from yesterday. pt not impulsive and requesting to use RW. when asked if she had to go to the bathroom what she should do she reported "press the red button". according to dtr-in-law she is back to her baseline cognitively    Exercises      General Comments        Pertinent Vitals/Pain Pain Assessment: No/denies pain    Home Living                      Prior Function            PT Goals (current goals can now be found in the care plan section) Acute Rehab PT Goals Patient Stated Goal: go home Progress towards PT goals: Progressing toward goals  Frequency  Min 3X/week    PT Plan Discharge plan needs to be updated    Co-evaluation             End of Session Equipment Utilized During Treatment: Gait belt Activity Tolerance: Patient tolerated treatment well Patient left: in chair;with chair alarm set;with call bell/phone within reach     Time: 1426-1440 PT Time Calculation (min) (ACUTE ONLY): 14 min  Charges:  $Gait Training: 8-22 mins                    G Codes:      Kingsley Callander 12/26/2014, 4:25 PM  Kittie Plater, PT, DPT Pager #: [phone] Office #: [phone]

## 2014-12-26 NOTE — Care Management Note (Signed)
Case Management Note  Patient Details  Name: TALANI BRAZEE MRN: [id number] Date of Birth: 06/24/1922  Subjective/Objective:                 Patient from [facility], will be discharged today to [facility] with PT and OT through [facility]. Referral made to [facility] with [facility].    Action/Plan:   Expected Discharge Date:   (assisted living staff)               Expected Discharge Plan:  Assisted Living / Rest Home  In-House Referral:     Discharge planning Services  CM Consult  Post Acute Care Choice:  Home Health Choice offered to:  Patient  DME Arranged:    DME Agency:     HH Arranged:  PT, OT HH Agency:  [place]  Status of Service:  Completed, signed off  Medicare Important Message Given:  Yes-second notification given Date Medicare IM Given:    Medicare IM give by:    Date Additional Medicare IM Given:    Additional Medicare Important Message give by:     If discussed at [place] of Stay Meetings, dates discussed:    Additional Comments:  Carles Collet, RN 12/26/2014, 11:07 AM

## 2014-12-26 NOTE — Discharge Summary (Signed)
Physician Discharge Summary  Rebecca Knight GEX:[id number] DOB: [birth year] DOA: 12/23/2014  PCP: Rachell Cipro, MD  Admit date: 12/23/2014 Discharge date: 12/26/2014  Time spent: 40 minutes  Recommendations for Outpatient Follow-up:  1. Follow-up with primary care physician within one week. 2. Home health PT/OT to follow-up with the patient in the assisted living facility  Discharge Diagnoses:  Principal Problem:   Acute encephalopathy Active Problems:   HYPERLIPIDEMIA   Essential hypertension   Cerebral thrombosis with cerebral infarction   Discharge Condition: Stable  Diet recommendation: Heart healthy  Filed Weights   12/24/14 2000  Weight: 51.4 kg (113 lb 5.1 oz)    History of present illness:  Rebecca Knight is a 81 y.o. female with a past medical history of hypertension, dyslipidemia, referred to the emergency department by her primary care physician for further workup of confusion. At baseline she is highly active, independent on activities of daily living and instrumental activities of daily living. Over the past week she has been mildly confused, disoriented, saw her primary care provider who diagnosed with UTI and was given antibiotic therapy with Bactrim. Family members reporting that she is unsteady on her feet appears to have a generalized weakness. She denies focal neurological deficits, recent falls, fevers, chills, nausea, vomiting, abdominal pain or dysuria.  Hospital Course:   Acute stroke Patient presented to the hospital with generalized weakness. Patient also mentioned she has had problems driving so she quit driving recently. MRI of the brain showed acute infarct involving the left external capsule. Neurology consulted. Stroke workup. 1. HgbA1c is pending, fasting lipid profile LDL is 56 2. MRI, MRA of the brain without contrast showed acute infarct involving the left external capsule 3. PT consult, OT consult, Speech consult, recommended home health  PT/OT 4. Echocardiogram: LVEF is 60-65% with grade 1 diastolic dysfunction, no source of emboli 5. Carotid dopplers: Pending at time of discharge 6. Prophylactic therapy-Antiplatelet med: Aspirin 81 mg  7. Risk factor modification control blood pressure and cholesterol  Hypertension Restarted home medications.  Hyperlipidemia Total cholesterol is 123, LDL is 56 at goal, continue Pravachol  History of AAA repair Patient follows as outpatient with serial Doppler ultrasound, appears to be stable.   Procedures:  2-D echo, without source of emboli, carotid duplex pending  Consultations:  None  Discharge Exam: Filed Vitals:   12/26/14 0940  BP: 119/66  Pulse: 88  Temp: 97.7 F (36.5 C)  Resp: 16   General: Alert and awake, oriented x3, not in any acute distress. HEENT: anicteric sclera, pupils reactive to light and accommodation, EOMI CVS: S1-S2 clear, no murmur rubs or gallops Chest: clear to auscultation bilaterally, no wheezing, rales or rhonchi Abdomen: soft nontender, nondistended, normal bowel sounds, no organomegaly Extremities: no cyanosis, clubbing or edema noted bilaterally Neuro: Cranial nerves II-XII intact, no focal neurological deficits  Discharge Instructions   Discharge Instructions    Diet - low sodium heart healthy    Complete by:  As directed      Increase activity slowly    Complete by:  As directed           Current Discharge Medication List    START taking these medications   Details  aspirin EC 81 MG EC tablet Take 1 tablet (81 mg total) by mouth daily.      CONTINUE these medications which have NOT CHANGED   Details  Cholecalciferol (VITAMIN D3) 1000 UNITS CAPS Take 1 capsule by mouth daily.     DULoxetine (CYMBALTA)  30 MG capsule Take 1 capsule by mouth 2 (two) times daily. Refills: 2    losartan (COZAAR) 50 MG tablet Take 50 mg by mouth daily.    metoprolol tartrate (LOPRESSOR) 25 MG tablet Take 25 mg by mouth 2 (two) times  daily.    Multiple Vitamins-Minerals (MULTIVITAMIN PO) Take 1 tablet by mouth daily.    Polyethyl Glycol-Propyl Glycol (SYSTANE) 0.4-0.3 % SOLN Apply 1 drop to eye 2 (two) times daily.    pravastatin (PRAVACHOL) 20 MG tablet 1 BY MOUTH DAILY Qty: 90 tablet, Refills: 1      STOP taking these medications     sulfamethoxazole-trimethoprim (BACTRIM DS,SEPTRA DS) 800-160 MG per tablet        Allergies  Allergen Reactions  . Penicillins Rash    Rash       The results of significant diagnostics from this hospitalization (including imaging, microbiology, ancillary and laboratory) are listed below for reference.    Significant Diagnostic Studies: Ct Head Wo Contrast  12/23/2014   CLINICAL DATA:  Gait disturbance and altered mental status ; lethargy  EXAM: CT HEAD WITHOUT CONTRAST  TECHNIQUE: Contiguous axial images were obtained from the base of the skull through the vertex without intravenous contrast.  COMPARISON:  July 21, 2005  FINDINGS: There is mild diffuse atrophy. There is no intracranial mass, hemorrhage, extra-axial fluid collection, or midline shift. There is small vessel disease throughout the centra semiovale which has progressed compared to prior study. There is small vessel disease throughout the left internal and external capsule regions as well as in the anterior limb of the right external capsule. There is evidence of a prior small lacunar infarct in the anterior right lentiform nucleus. There is evidence of a prior small lacunar infarct in the posterior limb of the left internal capsule. There is small vessel disease in the thalamic regions bilaterally. No acute infarct is demonstrable on this study. The bony calvarium appears intact. The mastoid air cells are clear.  IMPRESSION: Atrophy with fairly extensive supratentorial small vessel disease. No intracranial mass, hemorrhage, or acute appearing infarct.   Electronically Signed   By: Lowella Grip III M.D.   On: 12/23/2014  13:35   Mr Jodene Nam Head Wo Contrast  12/25/2014   CLINICAL DATA:  Acute infarct. Confusion and weakness. Functional decline.  EXAM: MRI HEAD WITHOUT CONTRAST  MRA HEAD WITHOUT CONTRAST  TECHNIQUE: Multiplanar, multiecho pulse sequences of the brain and surrounding structures were obtained without intravenous contrast. Angiographic images of the head were obtained using MRA technique without contrast.  COMPARISON:  MRI head 12/23/2014  FINDINGS: MRI HEAD FINDINGS  Acute infarct left external capsule unchanged. This extends down to the subinsular white matter on the left also unchanged. No new area of acute infarct since the prior MRI 2 days ago.  Moderate to advanced chronic microvascular ischemic change throughout the cerebral white matter. Chronic ischemic change in the basal ganglia and thalamus and pons bilaterally.  Generalized atrophy. This is most prominent in the temporal lobes but also present in the frontal and parietal lobes.  Negative for hemorrhage or mass lesion. No edema or shift of the midline structures. No new findings. Pituitary normal in size.  Paranasal sinuses clear.  MRA HEAD FINDINGS  Both vertebral arteries patent to the basilar. Right PICA patent. Left PICA not visualized. Basilar widely patent. Right AICA patent. Superior cerebellar artery and posterior cerebral artery appear diseased on the right but patent on the left.  Internal carotid artery patent bilaterally without  significant stenosis. Anterior and middle cerebral arteries patent bilaterally without significant stenosis.  Negative for cerebral aneurysm.  IMPRESSION: Acute infarct left external capsule and subinsular white matter, unchanged from 2 days ago. No new infarct since that time.  Moderate atrophy and moderate to advanced chronic ischemic change.  No large vessel occlusion on MRA. There is disease in the right superior cerebellar artery and the posterior cerebral artery bilaterally.   Electronically Signed   By: Franchot Gallo  M.D.   On: 12/25/2014 11:02   Mr Brain Wo Contrast  12/25/2014   CLINICAL DATA:  Acute infarct. Confusion and weakness. Functional decline.  EXAM: MRI HEAD WITHOUT CONTRAST  MRA HEAD WITHOUT CONTRAST  TECHNIQUE: Multiplanar, multiecho pulse sequences of the brain and surrounding structures were obtained without intravenous contrast. Angiographic images of the head were obtained using MRA technique without contrast.  COMPARISON:  MRI head 12/23/2014  FINDINGS: MRI HEAD FINDINGS  Acute infarct left external capsule unchanged. This extends down to the subinsular white matter on the left also unchanged. No new area of acute infarct since the prior MRI 2 days ago.  Moderate to advanced chronic microvascular ischemic change throughout the cerebral white matter. Chronic ischemic change in the basal ganglia and thalamus and pons bilaterally.  Generalized atrophy. This is most prominent in the temporal lobes but also present in the frontal and parietal lobes.  Negative for hemorrhage or mass lesion. No edema or shift of the midline structures. No new findings. Pituitary normal in size.  Paranasal sinuses clear.  MRA HEAD FINDINGS  Both vertebral arteries patent to the basilar. Right PICA patent. Left PICA not visualized. Basilar widely patent. Right AICA patent. Superior cerebellar artery and posterior cerebral artery appear diseased on the right but patent on the left.  Internal carotid artery patent bilaterally without significant stenosis. Anterior and middle cerebral arteries patent bilaterally without significant stenosis.  Negative for cerebral aneurysm.  IMPRESSION: Acute infarct left external capsule and subinsular white matter, unchanged from 2 days ago. No new infarct since that time.  Moderate atrophy and moderate to advanced chronic ischemic change.  No large vessel occlusion on MRA. There is disease in the right superior cerebellar artery and the posterior cerebral artery bilaterally.   Electronically Signed    By: Franchot Gallo M.D.   On: 12/25/2014 11:02   Mr Brain Wo Contrast  12/23/2014   CLINICAL DATA:  Acute encephalopathy. One week history of functional decline  EXAM: MRI HEAD WITHOUT CONTRAST  TECHNIQUE: Multiplanar, multiecho pulse sequences of the brain and surrounding structures were obtained without intravenous contrast.  COMPARISON:  CT head 12/23/2014  FINDINGS: Acute infarct left external capsule.  No other acute infarct  Moderate to advanced chronic microvascular ischemic change throughout the cerebral white matter and pons.  Negative for intracranial hemorrhage. Negative for mass or edema. No shift of the midline structures. Negative for hydrocephalus.  IMPRESSION: Acute infarct left external capsule  Atrophy and extensive chronic microvascular ischemic change.   Electronically Signed   By: Franchot Gallo M.D.   On: 12/23/2014 21:24    Microbiology: No results found for this or any previous visit (from the past 240 hour(s)).   Labs: Basic Metabolic Panel:  Recent Labs Lab 12/23/14 1200 12/23/14 1213 12/24/14 0431 12/25/14 0625 12/26/14 0523  NA 135 137 136 136 135  K 4.3 4.1 3.5 4.3 4.4  CL 100* 97* 100* 101 99*  CO2 27  --  26 28 28   GLUCOSE 100* 95 101*  117* 121*  BUN 10 16 9 8 12   CREATININE 1.20* 1.20* 1.11* 1.06* 1.13*  CALCIUM 10.1  --  9.6 9.9 10.0   Liver Function Tests:  Recent Labs Lab 12/23/14 1200  AST 26  ALT 13*  ALKPHOS 51  BILITOT 0.7  PROT 6.6  ALBUMIN 3.9   No results for input(s): LIPASE, AMYLASE in the last 168 hours. No results for input(s): AMMONIA in the last 168 hours. CBC:  Recent Labs Lab 12/23/14 1200 12/23/14 1213 12/24/14 0431  WBC 6.9  --  5.5  NEUTROABS 4.8  --   --   HGB 14.2 15.0 13.8  HCT 42.3 44.0 41.6  MCV 85.5  --  86.7  PLT 183  --  160   Cardiac Enzymes: No results for input(s): CKTOTAL, CKMB, CKMBINDEX, TROPONINI in the last 168 hours. BNP: BNP (last 3 results) No results for input(s): BNP in the last 8760  hours.  ProBNP (last 3 results) No results for input(s): PROBNP in the last 8760 hours.  CBG:  Recent Labs Lab 12/23/14 1259  GLUCAP 85       Signed:  Freedom Lopezperez A  [facility] 12/26/2014, 10:35 AM

## 2014-12-26 NOTE — Progress Notes (Signed)
Patient had a near syncable episode while getting ready to leave patient is stable and attending was notified, discharge is postponed for PT reevaluation. Will continue to monitor.

## 2014-12-26 NOTE — Progress Notes (Signed)
Patient being discharged back to assisted living facility, IV removed and friend [employer] is transporting her discharge summary completed and prescription given. Patient alert and oriented with no complaints of pain.

## 2014-12-26 NOTE — Progress Notes (Signed)
STROKE TEAM PROGRESS NOTE   SUBJECTIVE (INTERVAL HISTORY) Patient was at the sink, brushing her teeth with her daughter when she stopped talking and became weak. Patient unable to walk. Dr. Erlinda Hong and Ivin Booty picked up patient and laid her in the bed, lowered her head. Once flat, pt resumed talking. BP 162/76, HR 71 when back in the bed. Plans for discharge today back to AL was postponed.    OBJECTIVE Temp:  [97.5 F (36.4 C)-98.6 F (37 C)] 97.7 F (36.5 C) (09/05 0940) Pulse Rate:  [71-88] 88 (09/05 0940) Cardiac Rhythm:  [-] Heart block (09/05 0800) Resp:  [16-18] 16 (09/05 0940) BP: (119-168)/(60-96) 119/66 mmHg (09/05 0940) SpO2:  [97 %-99 %] 98 % (09/05 0940)  CBC:   Recent Labs Lab 12/23/14 1200 12/23/14 1213 12/24/14 0431  WBC 6.9  --  5.5  NEUTROABS 4.8  --   --   HGB 14.2 15.0 13.8  HCT 42.3 44.0 41.6  MCV 85.5  --  86.7  PLT 183  --  528    Basic Metabolic Panel:   Recent Labs Lab 12/25/14 0625 12/26/14 0523  NA 136 135  K 4.3 4.4  CL 101 99*  CO2 28 28  GLUCOSE 117* 121*  BUN 8 12  CREATININE 1.06* 1.13*  CALCIUM 9.9 10.0    Lipid Panel:     Component Value Date/Time   CHOL 123 12/24/2014 1130   TRIG 106 12/24/2014 1130   HDL 46 12/24/2014 1130   CHOLHDL 2.7 12/24/2014 1130   VLDL 21 12/24/2014 1130   LDLCALC 56 12/24/2014 1130   HgbA1c:  Lab Results  Component Value Date   HGBA1C 5.7 06/16/2008   Urine Drug Screen:     Component Value Date/Time   LABOPIA NONE DETECTED 12/23/2014 1251   COCAINSCRNUR NONE DETECTED 12/23/2014 1251   LABBENZ NONE DETECTED 12/23/2014 1251   AMPHETMU NONE DETECTED 12/23/2014 1251   THCU NONE DETECTED 12/23/2014 1251   LABBARB NONE DETECTED 12/23/2014 1251     IMAGING  I have personally reviewed the radiological images below and agree with the radiology interpretations.  Ct Head Wo Contrast 12/23/2014    Atrophy with fairly extensive supratentorial small vessel disease. No intracranial mass, hemorrhage,  or acute appearing infarct.     Mr Brain Wo Contrast 12/23/2014    Acute infarct left external capsule  Atrophy and extensive chronic microvascular ischemic change.     MRA 12/25/2014 Moderate atrophy and moderate to advanced chronic ischemic change. No large vessel occlusion on MRA.  There is disease in the right superior cerebellar artery and the posterior cerebral artery bilaterally.  2D echo - - Left ventricle: The cavity size was normal. Wall thickness was increased in a pattern of mild LVH. Systolic function was normal. The estimated ejection fraction was in the range of 60% to 65%. Wall motion was normal; there were no regional wall motion abnormalities. Doppler parameters are consistent with abnormal left ventricular relaxation (grade 1 diastolic dysfunction). - Aortic valve: Valve mobility was restricted. There was mild stenosis. There was trivial regurgitation. Valve area (VTI): 1.38 cm^2. Valve area (Vmax): 1.36 cm^2. Valve area (Vmean): 1.28 cm^2. - Mitral valve: Calcified annulus. - Atrial septum: There was an atrial septal aneurysm. Impressions: - Normal LV systolic function; grade 1 diastolic dysfunction; heavily calcified aortic valve with mild AS by mean gradient (16 mmHg); trace AI; trace MR; atrial septal aneurysm.  CUS - pending   PHYSICAL EXAM General - lethargic in bed after the  pre-syncope event   Cardiovascular - Regular rate and rhythm Pulmonary: CTA Abdomen: NT, ND, normal bowel sounds Extremities: No C/C/E  Neurological Exam Mental Status: awake, but lethargic after the pre-syncope event Orientation:  Oriented to person, place and time Speech:  Fluent; no dysarthria, but slow with hypophonia after the pre-syncope event Cranial Nerves:  PERRL; EOMI; visual fields full, face grossly symmetric, hearing grossly decreased; shrug symmetric and tongue midline  Motor Exam:  Tone:  Within normal limits; Strength: 5-/5  throughout Sensory: Intact to light touch throughout Coordination:  Intact finger to nose Gait: Deferred   ASSESSMENT/PLAN Ms. ANJELIKA AUSBURN is a 81 y.o. female with history of hypertension, hyperlipidemia, abdominal aortic aneurysm, anxiety, history of colon and rectal cancer, and herpes zoster of the eye in 2002 with postherpetic neuralgia presenting with mild confusion with disorientation and generalized weakness She did not receive IV t-PA due to late presentation.  Stroke:  L external capsule infarct secondary to small vessel disease   Resultant mild lethargy   MRI  acute infarct of the left external capsule  MRA  no large vessel occlusion  Carotid Doppler pending   2D Echo  EF 60-65%. Atrial septal aneurysm but no cardiac source of emboli identified.  LDL 56  HgbA1c pending   Lovenox for VTE prophylaxis Diet Heart Room service appropriate?: Yes; Fluid consistency:: Thin Diet - low sodium heart healthy  no antithrombotic prior to admission, now on aspirin 81 mg orally every day.  Patient counseled to be compliant with her antithrombotic medications  Ongoing aggressive stroke risk factor management  Therapy recommendations:  HH PT  Disposition: plan return to AL  Near syncopal episode - resolved after lying flat  ? Etiology - could be orthostatic hypotension vs. dehydration  Recommend holding discharge for today  RN spoke with attending while neuro present in the room, discharge canceled  Encourage more fluid intake and avoid hypotension  Hypertension  Stable, but mildly elevated  BP goal normotensive   On losartan and metoprolol  Hyperlipidemia  Home meds: Pravachol 20 mg daily resumed in hospital  LDL 56, at goal < 70  Continue statin at discharge  Other Stroke Risk Factors  Advanced age  Family hx stroke (Mother)  Other Active Problems  Mildly elevated creatinine  Hospital day # 3  Rosalin Hawking, MD PhD Stroke  Neurology 12/26/2014 10:10 PM    To contact Stroke Continuity provider, please refer to [email or web address]  After hours, contact General Neurology

## 2014-12-26 NOTE — Clinical Social Work Note (Signed)
Clinical Social Work Assessment  Patient Details  Name: Rebecca Knight MRN: [id number] Date of Birth: [birth year]  Date of referral:  12/26/14               Reason for consult:   Rebecca Knight [facility] )              Housing/Transportation Living arrangements for the past 2 months:  [place] of Information:  Patient Patient Interpreter Needed:  None Criminal Activity/Legal Involvement Pertinent to Current Situation/Hospitalization:  No - Comment as needed Significant Relationships:  Friend Lives with:  Facility Resident Do you feel safe going back to the place where you live?  Yes Need for family participation in patient care:  No (Coment)  Care giving concerns:  N/A   Facilities manager / plan: CSW met the pt at the bedside. CSW introduced self and purpose of visit. Pt confirmed that she lives at [facility]. Pt reported that she would like to return back to her ALF. CSW provided the pt with contact information for further questions. CSW will continue to follow this pt and assist with discharge as needed.   Employment status:  Retired Careers adviser  PT Recommendations:  Home with [place] / Referral to community resources:   (N/A)  Patient/Family's Response to care:  Pt reported that she has been treated well.   Patient/Family's Understanding of and Emotional Response to Diagnosis, Current Treatment, and Prognosis:  Pt acknowledged her diagnosis and her treatment. Pt reported being ready to return home with her friends.    Emotional Assessment Appearance:  Appears stated Knight Attitude/Demeanor/Rapport:   ([place] ) Affect (typically observed):  Accepting Orientation:  Oriented to Self, Oriented to Place, Oriented to  Time, Oriented to Situation Alcohol / Substance use:  Not Applicable Psych involvement (Current and /or in the community):  No (Comment)  Discharge Needs  Concerns to be  addressed:  Denies Needs/Concerns at this time Readmission within the last 30 days:  No Current discharge risk:  None Barriers to Discharge:  No Barriers Identified   Rebecca Schrupp, LCSW 12/26/2014, 2:07 PM

## 2014-12-26 NOTE — Care Management Important Message (Signed)
Important Message  Patient Details  Name: Rebecca Knight MRN: [id number] Date of Birth: [birth year]   Medicare Important Message Given:  Yes-second notification given    Loann Quill 12/26/2014, 8:59 AM

## 2014-12-27 ENCOUNTER — Inpatient Hospital Stay (HOSPITAL_COMMUNITY): Payer: Medicare Other

## 2014-12-27 DIAGNOSIS — I639 Cerebral infarction, unspecified: Secondary | ICD-10-CM

## 2014-12-27 DIAGNOSIS — R410 Disorientation, unspecified: Secondary | ICD-10-CM

## 2014-12-27 DIAGNOSIS — G9340 Encephalopathy, unspecified: Secondary | ICD-10-CM

## 2014-12-27 LAB — HEMOGLOBIN A1C
HEMOGLOBIN A1C: 5.5 % (ref 4.8–5.6)
MEAN PLASMA GLUCOSE: 111 mg/dL

## 2014-12-27 NOTE — Progress Notes (Signed)
OT Cancellation Note  Patient Details Name: Rebecca Knight MRN: [id number] DOB: Aug 30, 1922   Cancelled Treatment:    Reason Eval/Treat Not Completed: Other (comment) Pt evaluated 9/3 with recommendations to follow up with [place]. Continue to recommend pt follow up with HHOT after D/C. Defer further OT to [facility]. Acute OT signing off.  [place], OTR/L  [phone] 12/27/2014 12/27/2014, 8:28 AM

## 2014-12-27 NOTE — Clinical Social Work Note (Signed)
CSW met pt at bedside.  CSW informed the pt that she will be discharge back to [facility] today. CSW and pt discussed transport. Pt reported that she will have her friend [employer]ram [employer]ard transport her. CSW upload the pt's discharge summary. Bedside RN provided the number to call report.   [place], MSW, [place]

## 2014-12-27 NOTE — Progress Notes (Signed)
*  PRELIMINARY RESULTS* Vascular Ultrasound Carotid Duplex (Doppler) has been completed.   Findings suggest 1-39% internal carotid artery stenosis bilaterally. Vertebral arteries are patent with antegrade flow.  12/27/2014 9:34 AM Maudry Mayhew, RVT, RDCS, RDMS

## 2014-12-27 NOTE — Discharge Summary (Signed)
Physician Discharge Summary  Rebecca Knight:[id number] DOB: 1922-12-11 DOA: 12/23/2014  PCP: Rachell Cipro, MD  Admit date: 12/23/2014 Discharge date: 12/27/2014  Time spent: 40 minutes  Recommendations for Outpatient Follow-up:  1. Follow-up with primary care physician within one week. 2. Home health PT/OT to follow-up with the patient in the assisted living facility  Discharge Diagnoses:  Principal Problem:   Acute encephalopathy Active Problems:   HYPERLIPIDEMIA   Essential hypertension   Cerebral thrombosis with cerebral infarction   Stroke   Syncope and collapse   Discharge Condition: Stable  Diet recommendation: Heart healthy  Filed Weights   12/24/14 2000  Weight: 51.4 kg (113 lb 5.1 oz)    History of present illness:  Rebecca Knight is a 81 y.o. female with a past medical history of hypertension, dyslipidemia, referred to the emergency department by her primary care physician for further workup of confusion. At baseline she is highly active, independent on activities of daily living and instrumental activities of daily living. Over the past week she has been mildly confused, disoriented, saw her primary care provider who diagnosed with UTI and was given antibiotic therapy with Bactrim. Family members reporting that she is unsteady on her feet appears to have a generalized weakness. She denies focal neurological deficits, recent falls, fevers, chills, nausea, vomiting, abdominal pain or dysuria.  Hospital Course:   Acute stroke Patient presented to the hospital with generalized weakness. Patient also mentioned she has had problems driving so she quit driving recently. MRI of the brain showed acute infarct involving the left external capsule. Neurology consulted. Stroke workup. 1. HgbA1c is pending, fasting lipid profile LDL is 56 2. MRI, MRA of the brain without contrast showed acute infarct involving the left external capsule 3. PT consult, OT consult, Speech  consult, recommended home health PT/OT 4. Echocardiogram: LVEF is 60-65% with grade 1 diastolic dysfunction, no source of emboli 5. Carotid dopplers: Pending at time of discharge 6. Prophylactic therapy-Antiplatelet med: Aspirin 81 mg  7. Risk factor modification control blood pressure and cholesterol  Hypertension Restarted home medications.  Hyperlipidemia Total cholesterol is 123, LDL is 56 at goal, continue Pravachol  History of AAA repair Patient follows as outpatient with serial Doppler ultrasound, appears to be stable.  Syncopal episode Patient initial discharge on 12/26/14 and on the day of discharge he developed syncopal episode. Discharge postponed, for the rest of the day patient did very okay, PT reevaluated her and their recommendation was a still to go home with home health PT.   Procedures:  2-D echo, without source of emboli, carotid duplex showed no evidence of stenosis.  Consultations:  None  Discharge Exam: Filed Vitals:   12/27/14 0508  BP: 154/79  Pulse: 82  Temp: 97.9 F (36.6 C)  Resp: 18   General: Alert and awake, oriented x3, not in any acute distress. HEENT: anicteric sclera, pupils reactive to light and accommodation, EOMI CVS: S1-S2 clear, no murmur rubs or gallops Chest: clear to auscultation bilaterally, no wheezing, rales or rhonchi Abdomen: soft nontender, nondistended, normal bowel sounds, no organomegaly Extremities: no cyanosis, clubbing or edema noted bilaterally Neuro: Cranial nerves II-XII intact, no focal neurological deficits  Discharge Instructions   Discharge Instructions    Diet - low sodium heart healthy    Complete by:  As directed      Increase activity slowly    Complete by:  As directed           Current Discharge Medication List  START taking these medications   Details  aspirin EC 81 MG EC tablet Take 1 tablet (81 mg total) by mouth daily.      CONTINUE these medications which have NOT CHANGED    Details  Cholecalciferol (VITAMIN D3) 1000 UNITS CAPS Take 1 capsule by mouth daily.     DULoxetine (CYMBALTA) 30 MG capsule Take 1 capsule by mouth 2 (two) times daily. Refills: 2    losartan (COZAAR) 50 MG tablet Take 50 mg by mouth daily.    metoprolol tartrate (LOPRESSOR) 25 MG tablet Take 25 mg by mouth 2 (two) times daily.    Multiple Vitamins-Minerals (MULTIVITAMIN PO) Take 1 tablet by mouth daily.    Polyethyl Glycol-Propyl Glycol (SYSTANE) 0.4-0.3 % SOLN Apply 1 drop to eye 2 (two) times daily.    pravastatin (PRAVACHOL) 20 MG tablet 1 BY MOUTH DAILY Qty: 90 tablet, Refills: 1      STOP taking these medications     sulfamethoxazole-trimethoprim (BACTRIM DS,SEPTRA DS) 800-160 MG per tablet        Allergies  Allergen Reactions  . Penicillins Rash    Rash    Follow-up Information    Follow up with [facility], MD In 1 week.   Specialty:  Family Medicine   Contact information:   [address] [place] [address] [phone]       Follow up with [facility].   Why:  PT and OT. Will call in next 24-48 hours to set up first home visit at [facility].   Contact information:   [address] [place] [address] [phone]        The results of significant diagnostics from this hospitalization (including imaging, microbiology, ancillary and laboratory) are listed below for reference.    Significant Diagnostic Studies: Ct Head Wo Contrast  12/23/2014   CLINICAL DATA:  Gait disturbance and altered mental status ; lethargy  EXAM: CT HEAD WITHOUT CONTRAST  TECHNIQUE: Contiguous axial images were obtained from the base of the skull through the vertex without intravenous contrast.  COMPARISON:  July 21, 2005  FINDINGS: There is mild diffuse atrophy. There is no intracranial mass, hemorrhage, extra-axial fluid collection, or midline shift. There is small vessel disease throughout the centra semiovale which has progressed compared to  prior study. There is small vessel disease throughout the left internal and external capsule regions as well as in the anterior limb of the right external capsule. There is evidence of a prior small lacunar infarct in the anterior right lentiform nucleus. There is evidence of a prior small lacunar infarct in the posterior limb of the left internal capsule. There is small vessel disease in the thalamic regions bilaterally. No acute infarct is demonstrable on this study. The bony calvarium appears intact. The mastoid air cells are clear.  IMPRESSION: Atrophy with fairly extensive supratentorial small vessel disease. No intracranial mass, hemorrhage, or acute appearing infarct.   Electronically Signed   By: Lowella Grip III M.D.   On: 12/23/2014 13:35   Mr Jodene Nam Head Wo Contrast  12/25/2014   CLINICAL DATA:  Acute infarct. Confusion and weakness. Functional decline.  EXAM: MRI HEAD WITHOUT CONTRAST  MRA HEAD WITHOUT CONTRAST  TECHNIQUE: Multiplanar, multiecho pulse sequences of the brain and surrounding structures were obtained without intravenous contrast. Angiographic images of the head were obtained using MRA technique without contrast.  COMPARISON:  MRI head 12/23/2014  FINDINGS: MRI HEAD FINDINGS  Acute infarct left external capsule unchanged. This extends  down to the subinsular white matter on the left also unchanged. No new area of acute infarct since the prior MRI 2 days ago.  Moderate to advanced chronic microvascular ischemic change throughout the cerebral white matter. Chronic ischemic change in the basal ganglia and thalamus and pons bilaterally.  Generalized atrophy. This is most prominent in the temporal lobes but also present in the frontal and parietal lobes.  Negative for hemorrhage or mass lesion. No edema or shift of the midline structures. No new findings. Pituitary normal in size.  Paranasal sinuses clear.  MRA HEAD FINDINGS  Both vertebral arteries patent to the basilar. Right PICA patent.  Left PICA not visualized. Basilar widely patent. Right AICA patent. Superior cerebellar artery and posterior cerebral artery appear diseased on the right but patent on the left.  Internal carotid artery patent bilaterally without significant stenosis. Anterior and middle cerebral arteries patent bilaterally without significant stenosis.  Negative for cerebral aneurysm.  IMPRESSION: Acute infarct left external capsule and subinsular white matter, unchanged from 2 days ago. No new infarct since that time.  Moderate atrophy and moderate to advanced chronic ischemic change.  No large vessel occlusion on MRA. There is disease in the right superior cerebellar artery and the posterior cerebral artery bilaterally.   Electronically Signed   By: Franchot Gallo M.D.   On: 12/25/2014 11:02   Mr Brain Wo Contrast  12/25/2014   CLINICAL DATA:  Acute infarct. Confusion and weakness. Functional decline.  EXAM: MRI HEAD WITHOUT CONTRAST  MRA HEAD WITHOUT CONTRAST  TECHNIQUE: Multiplanar, multiecho pulse sequences of the brain and surrounding structures were obtained without intravenous contrast. Angiographic images of the head were obtained using MRA technique without contrast.  COMPARISON:  MRI head 12/23/2014  FINDINGS: MRI HEAD FINDINGS  Acute infarct left external capsule unchanged. This extends down to the subinsular white matter on the left also unchanged. No new area of acute infarct since the prior MRI 2 days ago.  Moderate to advanced chronic microvascular ischemic change throughout the cerebral white matter. Chronic ischemic change in the basal ganglia and thalamus and pons bilaterally.  Generalized atrophy. This is most prominent in the temporal lobes but also present in the frontal and parietal lobes.  Negative for hemorrhage or mass lesion. No edema or shift of the midline structures. No new findings. Pituitary normal in size.  Paranasal sinuses clear.  MRA HEAD FINDINGS  Both vertebral arteries patent to the basilar.  Right PICA patent. Left PICA not visualized. Basilar widely patent. Right AICA patent. Superior cerebellar artery and posterior cerebral artery appear diseased on the right but patent on the left.  Internal carotid artery patent bilaterally without significant stenosis. Anterior and middle cerebral arteries patent bilaterally without significant stenosis.  Negative for cerebral aneurysm.  IMPRESSION: Acute infarct left external capsule and subinsular white matter, unchanged from 2 days ago. No new infarct since that time.  Moderate atrophy and moderate to advanced chronic ischemic change.  No large vessel occlusion on MRA. There is disease in the right superior cerebellar artery and the posterior cerebral artery bilaterally.   Electronically Signed   By: Franchot Gallo M.D.   On: 12/25/2014 11:02   Mr Brain Wo Contrast  12/23/2014   CLINICAL DATA:  Acute encephalopathy. One week history of functional decline  EXAM: MRI HEAD WITHOUT CONTRAST  TECHNIQUE: Multiplanar, multiecho pulse sequences of the brain and surrounding structures were obtained without intravenous contrast.  COMPARISON:  CT head 12/23/2014  FINDINGS: Acute infarct left external  capsule.  No other acute infarct  Moderate to advanced chronic microvascular ischemic change throughout the cerebral white matter and pons.  Negative for intracranial hemorrhage. Negative for mass or edema. No shift of the midline structures. Negative for hydrocephalus.  IMPRESSION: Acute infarct left external capsule  Atrophy and extensive chronic microvascular ischemic change.   Electronically Signed   By: Franchot Gallo M.D.   On: 12/23/2014 21:24    Microbiology: No results found for this or any previous visit (from the past 240 hour(s)).   Labs: Basic Metabolic Panel:  Recent Labs Lab 12/23/14 1200 12/23/14 1213 12/24/14 0431 12/25/14 0625 12/26/14 0523  NA 135 137 136 136 135  K 4.3 4.1 3.5 4.3 4.4  CL 100* 97* 100* 101 99*  CO2 27  --  26 28 28    GLUCOSE 100* 95 101* 117* 121*  BUN 10 16 9 8 12   CREATININE 1.20* 1.20* 1.11* 1.06* 1.13*  CALCIUM 10.1  --  9.6 9.9 10.0   Liver Function Tests:  Recent Labs Lab 12/23/14 1200  AST 26  ALT 13*  ALKPHOS 51  BILITOT 0.7  PROT 6.6  ALBUMIN 3.9   No results for input(s): LIPASE, AMYLASE in the last 168 hours. No results for input(s): AMMONIA in the last 168 hours. CBC:  Recent Labs Lab 12/23/14 1200 12/23/14 1213 12/24/14 0431  WBC 6.9  --  5.5  NEUTROABS 4.8  --   --   HGB 14.2 15.0 13.8  HCT 42.3 44.0 41.6  MCV 85.5  --  86.7  PLT 183  --  160   Cardiac Enzymes: No results for input(s): CKTOTAL, CKMB, CKMBINDEX, TROPONINI in the last 168 hours. BNP: BNP (last 3 results) No results for input(s): BNP in the last 8760 hours.  ProBNP (last 3 results) No results for input(s): PROBNP in the last 8760 hours.  CBG:  Recent Labs Lab 12/23/14 1259  GLUCAP 85       Signed:  Laelynn Blizzard A  [facility] 12/27/2014, 11:27 AM

## 2014-12-27 NOTE — Evaluation (Signed)
Speech Language Pathology Evaluation Patient Details Name: Rebecca Knight MRN: [id number] DOB: [birth year] Today's Date: 12/27/2014 Time: 1025-8527 SLP Time Calculation (min) (ACUTE ONLY): 29 min  Problem List:  Patient Active Problem List   Diagnosis Date Noted  . Stroke   . Syncope and collapse   . Cerebral thrombosis with cerebral infarction 12/25/2014  . Acute encephalopathy 12/23/2014  . Urinary incontinence 02/10/2012  . Abdominal aneurysm without mention of rupture 04/13/2010  . CHOLELITHIASIS 04/13/2010  . NONSPEC ELEVATION OF LEVELS OF TRANSAMINASE/LDH 04/13/2010  . DIVERTICULOSIS, COLON 06/14/2008  . COLON CANCER, HX OF 06/14/2008  . THYROID NODULE, RIGHT 01/26/2007  . HYPERLIPIDEMIA 01/26/2007  . OSTEOPOROSIS NOS 01/26/2007  . POSTHERPETIC NEURALGIA 11/20/2006  . Essential hypertension 11/20/2006   Past Medical History:  Past Medical History  Diagnosis Date  . Hypertension   . Hyperlipidemia   . Heart murmur   . Arthritis   . AAA (abdominal aortic aneurysm)     [name]  . Varicose veins   . Anxiety   . Osteoporosis   . History of colon cancer 1995    Dr Lennie Hummer  . Herpes zoster of eye 2002    Dr. Herbert Deaner  . Post herpetic neuralgia   . Diverticulosis of colon   . Calculus of gallbladder without mention of cholecystitis or obstruction   . Cancer     Colon and Rectal   Past Surgical History:  Past Surgical History  Procedure Laterality Date  . Tonsillectomy    . Colon surgery      RESECTION in 1990s  . Eye surgery      CAT EXT  OU  . Endovascular stent insertion  08/08/2011    Procedure: ENDOVASCULAR STENT GRAFT INSERTION;  Surgeon: [name], MD;  Location: [facility] OR;  Service: Vascular;  Laterality: N/A;  Endo Vascular Aortic stent placement with gore   HPI:  81 yo female adm to [facility] with mental status changes and difficulty walking.  Pt found to have a left internal capsule CVA.  PMH + for syncope, CAD, resides at ALF.  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  Pt reports she manages her own medications and does her own laundry but others manage bills, cleaning, cooking.  Speech evaluation ordered.     Assessment / Plan / Recommendation Clinical Impression  Pt presents with CN deficits impacting right labial closure and palatal elevation deviation (pulls left) due to facial and vagus nerve deficits.  She is fluent and does not have dysarthria - although pt is hoarse and reports this to be new. Portion of MOCA (cognitive eval) given and pt demonstrated areas of difficulty in attention and memory primarily.  She required catergory and mulitlpe choice cues to recall 3/5 words and did not recall 2 of them despite cues.  This demonstrates storage and retrieval deficits.  She was oriented to person, place, needed cue to situation *CVA* and self corrected year.    Strengths include areas of language expression and reception- although hearing loss impacts this skill.    Pt reports she manages her own medications at her ALF and was agreeable *after testing showed memory deficits* to have her ALF assist.    Pt reports others manage her bills, cooking, etc; although she reports she does her own laundry.  Educated pt to findings and provided memory compensation strategies in writing.  As pt will have level of assist needed at next venue, no slp follow up  indicated.     SLP Assessment  Patient does not need any further Speech Lanaguage Pathology Services    Follow Up Recommendations  None    Frequency and Duration   n/a     Pertinent Vitals/Pain Pain Assessment: No/denies pain   SLP Goals  Patient/Family Stated Goal: none stated  SLP Evaluation Prior Functioning  Cognitive/Linguistic Baseline: Information not available Type of Home:  (ALF) Education: one year of college Vocation: Retired   Associate Professor  Overall Cognitive Status: No family/caregiver present to determine baseline cognitive  functioning Arousal/Alertness: Awake/alert Orientation Level: Oriented X4 (self corrected year) Attention: Sustained Sustained Attention: Appears intact Memory: Impaired Memory Impairment: Storage deficit;Retrieval deficit Awareness: Impaired Awareness Impairment: Intellectual impairment (decreased awareness to memory deficit) Problem Solving: Appears intact Safety/Judgment: Impaired (requested pt to let staff help her with medication adminstration at her ALF but she iniitally declined until after deficits noted on testing)    Comprehension  Auditory Comprehension Overall Auditory Comprehension: Appears within functional limits for tasks assessed Yes/No Questions: Not tested Commands: Within Functional Limits Conversation: Complex Interfering Components: Hearing EffectiveTechniques: Repetition Visual Recognition/Discrimination Discrimination: Not tested Reading Comprehension Reading Status: Not tested (pt uses reading glasses, not present at this time)    Expression Expression Primary Mode of Expression: Verbal Verbal Expression Overall Verbal Expression: Appears within functional limits for tasks assessed Initiation: No impairment Repetition: Impaired Level of Impairment: Sentence level (pt with good repetition of basic meaning of sentence, details including articles difficult) Naming: No impairment Pragmatics: No impairment Interfering Components: Attention Non-Verbal Means of Communication: Not applicable Written Expression Dominant Hand: Right Written Expression: Not tested (pt drew clock and set time correctly, did not test written expression)   Oral / Motor Oral Motor/Sensory Function Overall Oral Motor/Sensory Function: Impaired Labial Strength: Reduced Lingual ROM: Within Functional Limits Lingual Symmetry: Within Functional Limits Lingual Strength: Within Functional Limits Facial Strength: Reduced (decreased right) Velum: Impaired right (deviates to left upon  protrusion) Mandible: Within Functional Limits Motor Speech Overall Motor Speech: Impaired Respiration: Within functional limits Phonation: Hoarse;Low vocal intensity Resonance: Within functional limits Articulation: Within functional limitis Intelligibility: Intelligible Motor Planning: Witnin functional limits Motor Speech Errors: Not applicable Effective Techniques: Increased vocal intensity (decreasing background noise)   [place], [place] SLP [phone]

## 2014-12-27 NOTE — Progress Notes (Signed)
STROKE TEAM PROGRESS NOTE   SUBJECTIVE (INTERVAL HISTORY) Patient sitting up in bed. Looks much better today than she did yesterday. No complains. BP stable.   OBJECTIVE Temp:  [97.8 F (36.6 C)-98.2 F (36.8 C)] 97.9 F (36.6 C) (09/06 0508) Pulse Rate:  [74-82] 82 (09/06 0508) Cardiac Rhythm:  [-]  Resp:  [17-20] 18 (09/06 0508) BP: (149-167)/(68-87) 154/79 mmHg (09/06 0508) SpO2:  [98 %-99 %] 99 % (09/06 0508)  CBC:   Recent Labs Lab 12/23/14 1200 12/23/14 1213 12/24/14 0431  WBC 6.9  --  5.5  NEUTROABS 4.8  --   --   HGB 14.2 15.0 13.8  HCT 42.3 44.0 41.6  MCV 85.5  --  86.7  PLT 183  --  196    Basic Metabolic Panel:   Recent Labs Lab 12/25/14 0625 12/26/14 0523  NA 136 135  K 4.3 4.4  CL 101 99*  CO2 28 28  GLUCOSE 117* 121*  BUN 8 12  CREATININE 1.06* 1.13*  CALCIUM 9.9 10.0    Lipid Panel:     Component Value Date/Time   CHOL 123 12/24/2014 1130   TRIG 106 12/24/2014 1130   HDL 46 12/24/2014 1130   CHOLHDL 2.7 12/24/2014 1130   VLDL 21 12/24/2014 1130   LDLCALC 56 12/24/2014 1130   HgbA1c:  Lab Results  Component Value Date   HGBA1C 5.5 12/24/2014   Urine Drug Screen:     Component Value Date/Time   LABOPIA NONE DETECTED 12/23/2014 1251   COCAINSCRNUR NONE DETECTED 12/23/2014 1251   LABBENZ NONE DETECTED 12/23/2014 1251   AMPHETMU NONE DETECTED 12/23/2014 1251   THCU NONE DETECTED 12/23/2014 1251   LABBARB NONE DETECTED 12/23/2014 1251     IMAGING  Ct Head Wo Contrast 12/23/2014    Atrophy with fairly extensive supratentorial small vessel disease. No intracranial mass, hemorrhage, or acute appearing infarct.     Mr Brain Wo Contrast 12/23/2014    Acute infarct left external capsule  Atrophy and extensive chronic microvascular ischemic change.     MRA 12/25/2014 Moderate atrophy and moderate to advanced chronic ischemic change. No large vessel occlusion on MRA.  There is disease in the right superior cerebellar artery and the  posterior cerebral artery bilaterally.  2D echo  - Left ventricle: The cavity size was normal. Wall thickness wasincreased in a pattern of mild LVH. Systolic function was normal.The estimated ejection fraction was in the range of 60% to 65%.Wall motion was normal; there were no regional wall motionabnormalities. Doppler parameters are consistent with abnormalleft ventricular relaxation (grade 1 diastolic dysfunction). - Aortic valve: Valve mobility was restricted. There was mild stenosis. There was trivial regurgitation. Valve area (VTI): 1.38cm^2. Valve area (Vmax): 1.36 cm^2. Valve area (Vmean): 1.28cm^2. - Mitral valve: Calcified annulus. - Atrial septum: There was an atrial septal aneurysm. Impressions:  Normal LV systolic function; grade 1 diastolic dysfunction;heavily calcified aortic valve with mild AS by mean gradient (5mmHg); trace AI; trace MR; atrial septal aneurysm.  Carotid Doppler  There is 1-39% bilateral ICA stenosis. Vertebral artery flow is antegrade.     PHYSICAL EXAM General - lethargic in bed after the pre-syncope event   Cardiovascular - Regular rate and rhythm Pulmonary: CTA Abdomen: NT, ND, normal bowel sounds Extremities: No C/C/E  Neurological Exam Mental Status: awake, but lethargic after the pre-syncope event Orientation:  Oriented to person, place and time Speech:  Fluent; no dysarthria, but slow with hypophonia after the pre-syncope event Cranial Nerves:  PERRL; EOMI;  visual fields full, face grossly symmetric, hearing grossly decreased; shrug symmetric and tongue midline  Motor Exam:  Tone:  Within normal limits; Strength: 5-/5 throughout Sensory: Intact to light touch throughout Coordination:  Intact finger to nose Gait: Deferred   ASSESSMENT/PLAN Rebecca Knight is a 81 y.o. female with history of hypertension, hyperlipidemia, abdominal aortic aneurysm, anxiety, history of colon and rectal cancer, and herpes zoster of the eye in  2002 with postherpetic neuralgia presenting with mild confusion with disorientation and generalized weakness She did not receive IV t-PA due to late presentation.  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 Atrial septal aneurysm but no cardiac source of emboli identified.  LDL 56  HgbA1c 5.5, WNL   Lovenox for VTE prophylaxis Diet Heart Room service appropriate?: Yes; Fluid consistency:: Thin Diet - low sodium heart healthy  no antithrombotic prior to admission, now on aspirin 81 mg orally every day.  Patient counseled to be compliant with her antithrombotic medications  Ongoing aggressive stroke risk factor management  Therapy recommendations:  HH PT  Disposition: plan return to [place] with [place] therapies NOTHING FURTHER TO ADD FROM THE STROKE STANDPOINT Patient has a 10-15% risk of having another stroke over the next year, the highest risk is within 2 weeks of the most recent stroke/TIA (risk of having a stroke following a stroke or TIA is the same). Ongoing risk factor control by Primary Care Physician Stroke Service will sign off. Please call should any needs arise. Follow-up Stroke Clinic at [facility] with Dr. Rosalin Hawking in 2 months, order placed.   Near syncopal episode - resolved after lying flat  ? Etiology - could be orthostatic hypotension vs. dehydration  Recommend holding discharge for today  RN spoke with attending while neuro present in the room, discharge canceled  Encourage more fluid intake and avoid hypotension  Hypertension  Stable, but mildly elevated  BP goal normotensive   On losartan and metoprolol  Hyperlipidemia  Home meds: Pravachol 20 mg daily resumed in hospital  LDL 56, at goal < 70  Continue statin at  discharge  Other Stroke Risk Factors  Advanced age  Family hx stroke (Mother)  Other Active Problems  Mildly elevated creatinine  Hospital day # 4  Neurology will sign off. Please call with questions. Pt will follow up with Dr. Erlinda Hong at [facility] in about 2 months. Thanks for the consult.  Rosalin Hawking, MD PhD Stroke Neurology 12/27/2014 10:07 PM    To contact Stroke Continuity provider, please refer to [email or web address]  After hours, contact General Neurology

## 2014-12-27 NOTE — Progress Notes (Signed)
Physical Therapy Treatment Patient Details Name: Rebecca Knight MRN: [id number] DOB: 1923/02/12 Today's Date: 12/27/2014    History of Present Illness Rebecca Knight is a 81 y.o. female with a past medical history of HTN, dyslipidemia, referred to the emergency department by her primary care physician for further workup of confusion. At baseline she is highly active, independent for BADLS/IADLsactivities of daily. Over the past week she has been mildly confused, disoriented, saw her primary care provider who diagnosed with UTI and was given antibiotic therapy with Bactrim. Family members reporting that she is unsteady on her feet appears to have a generalized weakness. 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    PT Comments    Patient progressing slowly towards PT goals. Continue to recommend use of RW for ambulation/safety. Pt with 1 LOB during a turn requiring Min A to prevent fall. Cues for safety at times. Pt with shuffling of feet putting pt at increased risk for fall. Recommend supervision for mobility/OOB at home initially (a few days) for safety. Will continue to follow per current POC.   Follow Up Recommendations  Home health PT;Supervision for mobility/OOB     Equipment Recommendations  Rolling walker with 5" wheels    Recommendations for Other Services       Precautions / Restrictions Precautions Precautions: Fall Precaution Comments: had an episode of syncope yesterday haulting pt's d/c Restrictions Weight Bearing Restrictions: No    Mobility  Bed Mobility Overal bed mobility: Modified Independent                Transfers Overall transfer level: Needs assistance Equipment used: Rolling walker (2 wheeled) Transfers: Sit to/from Stand Sit to Stand: Supervision         General transfer comment: v/c's for safe hand placement and use of RW  Ambulation/Gait Ambulation/Gait assistance: Supervision;Min assist Ambulation Distance (Feet): 250  Feet Assistive device: Rolling walker (2 wheeled) Gait Pattern/deviations: Step-through pattern Gait velocity: appropriate for age   General Gait Details: Mostly supervision for safety during ambulation. 1 instance of LOB during turn when using RW requiring Min A to prevent fall. No dizziness. Cues for RW proximity/management.   Stairs            Wheelchair Mobility    Modified Rankin (Stroke Patients Only) Modified Rankin (Stroke Patients Only) Pre-Morbid Rankin Score: No significant disability Modified Rankin: Moderate disability     Balance Overall balance assessment: Needs assistance Sitting-balance support: Feet supported;No upper extremity supported Sitting balance-Leahy Scale: Good     Standing balance support: During functional activity Standing balance-Leahy Scale: Fair                      Cognition Arousal/Alertness: Awake/alert Behavior During Therapy: WFL for tasks assessed/performed Overall Cognitive Status: No family/caregiver present to determine baseline cognitive functioning Area of Impairment: Awareness (A&O x4.)       Following Commands: Follows multi-step commands with increased time   Awareness: Emergent   General Comments: Pt requests to use RW for ambulation for safety. Repetition required at times due to [facility].    Exercises      General Comments        Pertinent Vitals/Pain Pain Assessment: No/denies pain    Home Living       Type of Home:  (ALF)              Prior Function            PT Goals (current  goals can now be found in the care plan section) Progress towards PT goals: Progressing toward goals    Frequency  Min 3X/week    PT Plan Current plan remains appropriate    Co-evaluation             End of Session Equipment Utilized During Treatment: Gait belt Activity Tolerance: Patient tolerated treatment well Patient left: in bed;with bed alarm set;with call bell/phone within reach      Time: 1053-1108 PT Time Calculation (min) (ACUTE ONLY): 15 min  Charges:  $Gait Training: 8-22 mins                    G Codes:      [place] 12/27/2014, 11:24 AM Wray Kearns, Vincent, DPT [phone]

## 2014-12-28 ENCOUNTER — Telehealth: Payer: Self-pay | Admitting: *Deleted

## 2014-12-28 NOTE — Telephone Encounter (Signed)
Pt was on tcm list d/c 12/27/14 Not a patient of ours anymore. Use to see Dr. Linna Darner back in 2014. Pt see Dr. Rachell Cipro...Rebecca Knight

## 2015-01-03 ENCOUNTER — Ambulatory Visit: Payer: Medicare Other

## 2015-01-05 ENCOUNTER — Telehealth (HOSPITAL_COMMUNITY): Payer: Self-pay

## 2015-01-05 NOTE — Telephone Encounter (Signed)
Encounter complete. 

## 2015-01-10 ENCOUNTER — Telehealth (HOSPITAL_COMMUNITY): Payer: Self-pay

## 2015-01-10 ENCOUNTER — Ambulatory Visit (HOSPITAL_COMMUNITY): Admission: RE | Admit: 2015-01-10 | Payer: Medicare Other | Source: Ambulatory Visit

## 2015-01-10 NOTE — Telephone Encounter (Signed)
Encounter complete. 

## 2015-01-10 NOTE — Telephone Encounter (Signed)
Staff member Santa Genera did introduce herself as a Marine scientist. I did give her instructions for the pt to follow. She then told me that she could not take verbal orders for the pt and I would have to send the instructions via fax. I did call Ebony to allow her to contact the office. I will wait to hear from [facility] on how she wishes to proceed from this juncture since staff at the facility are not willing to take instructions over the phone.

## 2015-01-11 ENCOUNTER — Encounter (HOSPITAL_COMMUNITY): Payer: Self-pay | Admitting: *Deleted

## 2015-01-12 ENCOUNTER — Ambulatory Visit (HOSPITAL_COMMUNITY)
Admission: RE | Admit: 2015-01-12 | Discharge: 2015-01-12 | Disposition: A | Discharge status: Home / Self Care | Payer: Medicare Other | Source: Ambulatory Visit | Attending: Cardiovascular Disease | Admitting: Cardiovascular Disease

## 2015-01-12 DIAGNOSIS — I739 Peripheral vascular disease, unspecified: Secondary | ICD-10-CM | POA: Insufficient documentation

## 2015-01-12 DIAGNOSIS — E785 Hyperlipidemia, unspecified: Secondary | ICD-10-CM | POA: Insufficient documentation

## 2015-01-12 DIAGNOSIS — Z8249 Family history of ischemic heart disease and other diseases of the circulatory system: Secondary | ICD-10-CM | POA: Insufficient documentation

## 2015-01-12 DIAGNOSIS — I10 Essential (primary) hypertension: Secondary | ICD-10-CM | POA: Diagnosis not present

## 2015-01-12 DIAGNOSIS — R079 Chest pain, unspecified: Secondary | ICD-10-CM | POA: Insufficient documentation

## 2015-01-12 LAB — MYOCARDIAL PERFUSION IMAGING
CHL CUP NUCLEAR SRS: 6
CHL CUP RESTING HR STRESS: 61 {beats}/min
CSEPPHR: 85 {beats}/min
LVDIAVOL: 79 mL
LVSYSVOL: 32 mL
NUC STRESS TID: 1.12
SDS: 2
SSS: 8

## 2015-01-12 MED ORDER — REGADENOSON 0.4 MG/5ML IV SOLN
0.4000 mg | Freq: Once | INTRAVENOUS | Status: AC
  Administered 2015-01-12: 0.4 mg via INTRAVENOUS

## 2015-01-12 MED ORDER — TECHNETIUM TC 99M SESTAMIBI GENERIC - CARDIOLITE
10.1000 | Freq: Once | INTRAVENOUS | Status: AC | PRN
  Administered 2015-01-12: 10.1 via INTRAVENOUS

## 2015-01-12 MED ORDER — TECHNETIUM TC 99M SESTAMIBI GENERIC - CARDIOLITE
32.0000 | Freq: Once | INTRAVENOUS | Status: AC | PRN
  Administered 2015-01-12: 32 via INTRAVENOUS

## 2015-01-16 ENCOUNTER — Telehealth: Payer: Self-pay | Admitting: *Deleted

## 2015-01-16 NOTE — Telephone Encounter (Signed)
Spoke to patient. Result given . Verbalized understanding  

## 2015-01-16 NOTE — Telephone Encounter (Signed)
-----   Message from Skeet Latch, MD sent at 01/13/2015  6:18 PM EDT ----- Normal stress.

## 2015-01-17 ENCOUNTER — Encounter: Payer: Self-pay | Admitting: Neurology

## 2015-01-17 ENCOUNTER — Ambulatory Visit (INDEPENDENT_AMBULATORY_CARE_PROVIDER_SITE_OTHER): Payer: Medicare Other | Admitting: Neurology

## 2015-01-17 VITALS — BP 130/78 | HR 79 | Ht 63.0 in | Wt 116.0 lb

## 2015-01-17 DIAGNOSIS — I6381 Other cerebral infarction due to occlusion or stenosis of small artery: Secondary | ICD-10-CM

## 2015-01-17 DIAGNOSIS — I639 Cerebral infarction, unspecified: Secondary | ICD-10-CM | POA: Diagnosis not present

## 2015-01-17 NOTE — Progress Notes (Signed)
[facility] [address] [place] [address] [phone]       OFFICE FOLLOW-UP NOTE  Rebecca. Rebecca Knight Date of Birth:  05-Dec-1922 Medical Record Number:  [id number]   HPI: Rebecca Knight is a pleasant 81 year old Caucasian lady seen today for the first office follow-up visit following admission to [facility] on 12/23/14 for stroke. Rebecca Knight is a 81 y.o. female with a past medical history of hypertension, dyslipidemia, referred to the emergency department by her primary care physician for further workup of confusion. At baseline she is highly active, independent on activities of daily living and instrumental activities of daily living. Over the past week she has been mildly confused, disoriented, saw her primary care provider who diagnosed with UTI and was given antibiotic therapy with Bactrim. Family members reporting that she is unsteady on her feet appears to have a generalized weakness. She denies focal neurological deficits, recent falls, fevers, chills, nausea, vomiting, abdominal pain or dysuria.She was admitted to the neurology floor unit. CT scan of the brain on admission did not reveal acute abnormality but MRI scan showed acute infarct involving the left external capsule. MRA of the brain did not reveal large vessel stenosis or occlusion. Transthoracic echo showed normal ejection fraction with grade 1 diastolic dysfunction no cardiac source of embolism. Carotid ultrasound showed no significant central stenosis. Hemoglobin A1c was5.5 and LDL cholesterol was 77 mg percent. She was started on aspirin 81 mg daily for stroke prevention and blood pressure and cholesterol medications were continued. The patient's son states that she is doing well and her confusion has improved. She is now using a walker to walk and is getting physical and occupational therapy. She is living in assisted living facility and now her medications are provided under supervision. Patient  herself does not remember she's had a stroke and is unable to remember the details. The patient wants to drive but the son is concerned about this. ROS:   14 system review of systems is positive for murmur, hearing loss, memory loss, confusion, decreased energy and all other systems negative  PMH:  Past Medical History  Diagnosis Date  . Hypertension   . Hyperlipidemia   . Heart murmur   . Arthritis   . AAA (abdominal aortic aneurysm)     [name]  . Varicose veins   . Anxiety   . Osteoporosis   . History of colon cancer 1995    Dr Lennie Hummer  . Herpes zoster of eye 2002    Dr. Herbert Deaner  . Post herpetic neuralgia   . Diverticulosis of colon   . Calculus of gallbladder without mention of cholecystitis or obstruction   . Cancer     Colon and Rectal    Social History:  Social History   Social History  . Marital Status: Widowed    Spouse Name: N/A  . Number of Children: 2  . Years of Education: N/A   Occupational History  . retired    Social History Main Topics  . Smoking status: Never Smoker   . Smokeless tobacco: Never Used  . Alcohol Use: No  . Drug Use: No  . Sexual Activity: Not on file   Other Topics Concern  . Not on file   Social History Narrative    Medications:   Current Outpatient Prescriptions on File Prior to Visit  Medication Sig Dispense Refill  . aspirin EC 81 MG EC tablet Take 1 tablet (81 mg total) by mouth  daily.    . Cholecalciferol (VITAMIN D3) 1000 UNITS CAPS Take 1 capsule by mouth daily.     . DULoxetine (CYMBALTA) 30 MG capsule Take 1 capsule by mouth 2 (two) times daily.  2  . losartan (COZAAR) 50 MG tablet Take 50 mg by mouth daily.    . metoprolol tartrate (LOPRESSOR) 25 MG tablet Take 25 mg by mouth 2 (two) times daily.    . Multiple Vitamins-Minerals (MULTIVITAMIN PO) Take 1 tablet by mouth daily.    [name] (SYSTANE) 0.4-0.3 % SOLN Apply 1 drop to eye 2 (two) times daily.    . pravastatin (PRAVACHOL) 20  MG tablet 1 BY MOUTH DAILY 90 tablet 1   No current facility-administered medications on file prior to visit.    Allergies:   Allergies  Allergen Reactions  . Penicillins Rash    Rash     Physical Exam General: Frail elderly Caucasian lady, seated, in no evident distress Head: head normocephalic and atraumatic.  Neck: supple with no carotid or supraclavicular bruits Cardiovascular: regular rate and rhythm, no murmurs Musculoskeletal: no deformity Skin:  no rash/petichiae Vascular:  Normal pulses all extremities Filed Vitals:   01/17/15 0930  BP: 130/78  Pulse: 79   Neurologic Exam Mental Status: Awake and fully alert. Oriented to place and time. Recent and remote memory intact. Attention span, concentration and fund of knowledge appropriate. Mood and affect appropriate.  Cranial Nerves: Fundoscopic exam reveals sharp disc margins. Pupils equal, briskly reactive to light. Extraocular movements full without nystagmus. Visual fields full to confrontation. Hearing severely diminished bilaterally. Facial sensation intact. Face, tongue, palate moves normally and symmetrically.  Motor: Normal bulk and tone. Normal strength in all tested extremity muscles. Sensory.: intact to touch ,pinprick .position and vibratory sensation.  Coordination: Rapid alternating movements normal in all extremities. Finger-to-nose and heel-to-shin performed accurately bilaterally. Gait and Station: Arises from chair without difficulty. Stance is broad based. Gait demonstrates normal stride length and balance but needs a walker. . Not able to heel, toe and tandem walk .  Reflexes: 1+ and symmetric. Toes downgoing.   NIHSS  0 Modified Rankin  1   ASSESSMENT: 81 year old Caucasian lady with left external capsule infarct in September 2016 secondary to small vessel disease.     PLAN: I had a long d/w patient and son about her recent stroke, risk for recurrent stroke/TIAs, personally independently reviewed  imaging studies and stroke evaluation results and answered questions.Continue aspirin 81 mg orally every day  for secondary stroke prevention and maintain strict control of hypertension with blood pressure goal below 130/90, diabetes with hemoglobin A1c goal below 6.5% and lipids with LDL cholesterol goal below 100 mg/dL. I also advised the patient to eat a healthy diet with plenty of whole grains, cereals, fruits and vegetables, exercise regularly and maintain ideal body weight .continue ongoing physical and occupational therapy. I also counseled the patient not to drive till she recovers and is doing better. She may consider referral to driving motor school to get real time assessment of her driving skills on the road before making a final decision.Greater than 50% of time during this 25 minute visit was spent on counseling, discussion with patient and family and coordination of care  .Followup in the future with me in 6 months or call earlier if necessary Antony Contras, MD   Note: This document was prepared with digital dictation and possible smart phrase technology. Any transcriptional errors that result from this process are unintentional

## 2015-01-17 NOTE — Patient Instructions (Signed)
I had a long d/w patient and son about her recent stroke, risk for recurrent stroke/TIAs, personally independently reviewed imaging studies and stroke evaluation results and answered questions.Continue aspirin 81 mg orally every day  for secondary stroke prevention and maintain strict control of hypertension with blood pressure goal below 130/90, diabetes with hemoglobin A1c goal below 6.5% and lipids with LDL cholesterol goal below 100 mg/dL. I also advised the patient to eat a healthy diet with plenty of whole grains, cereals, fruits and vegetables, exercise regularly and maintain ideal body weight .continue ongoing physical and occupational therapy. I also counseled the patient not to drive till she recovers and is doing better. She may consider referral to driving motor school to get real time assessment of her driving skills on the road before making a final decision Followup in the future with me in 6 months or call earlier if necessary  Stroke Prevention Some medical conditions and behaviors are associated with an increased chance of having a stroke. You may prevent a stroke by making healthy choices and managing medical conditions. HOW CAN I REDUCE MY RISK OF HAVING A STROKE?   Stay physically active. Get at least 30 minutes of activity on most or all days.  Do not smoke. It may also be helpful to avoid exposure to secondhand smoke.  Limit alcohol use. Moderate alcohol use is considered to be:  No more than 2 drinks per day for men.  No more than 1 drink per day for nonpregnant women.  Eat healthy foods. This involves:  Eating 5 or more servings of fruits and vegetables a day.  Making dietary changes that address high blood pressure (hypertension), high cholesterol, diabetes, or obesity.  Manage your cholesterol levels.  Making food choices that are high in fiber and low in saturated fat, trans fat, and cholesterol may control cholesterol levels.  Take any prescribed medicines to  control cholesterol as directed by your health care provider.  Manage your diabetes.  Controlling your carbohydrate and sugar intake is recommended to manage diabetes.  Take any prescribed medicines to control diabetes as directed by your health care provider.  Control your hypertension.  Making food choices that are low in salt (sodium), saturated fat, trans fat, and cholesterol is recommended to manage hypertension.  Take any prescribed medicines to control hypertension as directed by your health care provider.  Maintain a healthy weight.  Reducing calorie intake and making food choices that are low in sodium, saturated fat, trans fat, and cholesterol are recommended to manage weight.  Stop drug abuse.  Avoid taking birth control pills.  Talk to your health care provider about the risks of taking birth control pills if you are over 75 years old, smoke, get migraines, or have ever had a blood clot.  Get evaluated for sleep disorders (sleep apnea).  Talk to your health care provider about getting a sleep evaluation if you snore a lot or have excessive sleepiness.  Take medicines only as directed by your health care provider.  For some people, aspirin or blood thinners (anticoagulants) are helpful in reducing the risk of forming abnormal blood clots that can lead to stroke. If you have the irregular heart rhythm of atrial fibrillation, you should be on a blood thinner unless there is a good reason you cannot take them.  Understand all your medicine instructions.  Make sure that other conditions (such as anemia or atherosclerosis) are addressed. SEEK IMMEDIATE MEDICAL CARE IF:   You have sudden weakness or numbness  of the face, arm, or leg, especially on one side of the body.  Your face or eyelid droops to one side.  You have sudden confusion.  You have trouble speaking (aphasia) or understanding.  You have sudden trouble seeing in one or both eyes.  You have sudden  trouble walking.  You have dizziness.  You have a loss of balance or coordination.  You have a sudden, severe headache with no known cause.  You have new chest pain or an irregular heartbeat. Any of these symptoms may represent a serious problem that is an emergency. Do not wait to see if the symptoms will go away. Get medical help at once. Call your local emergency services (911 in [place] Do not drive yourself to the hospital. Document Released: 05/16/2004 Document Revised: 08/23/2013 Document Reviewed: 10/09/2012 [facility] Patient Information 2015 [place], [place]. This information is not intended to replace advice given to you by your health care provider. Make sure you discuss any questions you have with your health care provider.

## 2015-05-13 ENCOUNTER — Emergency Department (HOSPITAL_COMMUNITY): Payer: Medicare Other

## 2015-05-13 ENCOUNTER — Encounter (HOSPITAL_COMMUNITY): Payer: Self-pay

## 2015-05-13 ENCOUNTER — Emergency Department (HOSPITAL_COMMUNITY)
Admission: EM | Admit: 2015-05-13 | Discharge: 2015-05-13 | Disposition: A | Discharge status: Home / Self Care | Payer: Medicare Other | Attending: Emergency Medicine | Admitting: Emergency Medicine

## 2015-05-13 DIAGNOSIS — R011 Cardiac murmur, unspecified: Secondary | ICD-10-CM | POA: Diagnosis not present

## 2015-05-13 DIAGNOSIS — I10 Essential (primary) hypertension: Secondary | ICD-10-CM | POA: Insufficient documentation

## 2015-05-13 DIAGNOSIS — Z8659 Personal history of other mental and behavioral disorders: Secondary | ICD-10-CM | POA: Insufficient documentation

## 2015-05-13 DIAGNOSIS — R509 Fever, unspecified: Secondary | ICD-10-CM | POA: Diagnosis present

## 2015-05-13 DIAGNOSIS — Z85038 Personal history of other malignant neoplasm of large intestine: Secondary | ICD-10-CM | POA: Insufficient documentation

## 2015-05-13 DIAGNOSIS — E785 Hyperlipidemia, unspecified: Secondary | ICD-10-CM | POA: Diagnosis not present

## 2015-05-13 DIAGNOSIS — Z8619 Personal history of other infectious and parasitic diseases: Secondary | ICD-10-CM | POA: Diagnosis not present

## 2015-05-13 DIAGNOSIS — M1990 Unspecified osteoarthritis, unspecified site: Secondary | ICD-10-CM | POA: Insufficient documentation

## 2015-05-13 DIAGNOSIS — Z880 Allergy status to penicillin: Secondary | ICD-10-CM | POA: Insufficient documentation

## 2015-05-13 DIAGNOSIS — Z8719 Personal history of other diseases of the digestive system: Secondary | ICD-10-CM | POA: Diagnosis not present

## 2015-05-13 DIAGNOSIS — Z7982 Long term (current) use of aspirin: Secondary | ICD-10-CM | POA: Insufficient documentation

## 2015-05-13 DIAGNOSIS — Z79899 Other long term (current) drug therapy: Secondary | ICD-10-CM | POA: Insufficient documentation

## 2015-05-13 LAB — COMPREHENSIVE METABOLIC PANEL
ALK PHOS: 50 U/L (ref 38–126)
ALT: 10 U/L — AB (ref 14–54)
ANION GAP: 9 (ref 5–15)
AST: 22 U/L (ref 15–41)
Albumin: 3.6 g/dL (ref 3.5–5.0)
BUN: 20 mg/dL (ref 6–20)
CALCIUM: 9.2 mg/dL (ref 8.9–10.3)
CO2: 28 mmol/L (ref 22–32)
CREATININE: 0.95 mg/dL (ref 0.44–1.00)
Chloride: 101 mmol/L (ref 101–111)
GFR, EST AFRICAN AMERICAN: 59 mL/min — AB (ref >60)
GFR, EST NON AFRICAN AMERICAN: 51 mL/min — AB (ref >60)
Glucose, Bld: 163 mg/dL — ABNORMAL HIGH (ref 65–99)
Potassium: 3.7 mmol/L (ref 3.5–5.1)
SODIUM: 138 mmol/L (ref 135–145)
TOTAL PROTEIN: 6.3 g/dL — AB (ref 6.5–8.1)
Total Bilirubin: 1.2 mg/dL (ref 0.3–1.2)

## 2015-05-13 LAB — CBC WITH DIFFERENTIAL/PLATELET
BASOS ABS: 0 10*3/uL (ref 0.0–0.1)
BASOS PCT: 0 %
EOS ABS: 0 10*3/uL (ref 0.0–0.7)
Eosinophils Relative: 0 %
HEMATOCRIT: 38.9 % (ref 36.0–46.0)
HEMOGLOBIN: 12.3 g/dL (ref 12.0–15.0)
Lymphocytes Relative: 11 %
Lymphs Abs: 0.9 10*3/uL (ref 0.7–4.0)
MCH: 27.5 pg (ref 26.0–34.0)
MCHC: 31.6 g/dL (ref 30.0–36.0)
MCV: 87 fL (ref 78.0–100.0)
MONOS PCT: 8 %
Monocytes Absolute: 0.6 10*3/uL (ref 0.1–1.0)
NEUTROS ABS: 6.5 10*3/uL (ref 1.7–7.7)
NEUTROS PCT: 81 %
Platelets: 142 10*3/uL — ABNORMAL LOW (ref 150–400)
RBC: 4.47 MIL/uL (ref 3.87–5.11)
RDW: 13.5 % (ref 11.5–15.5)
WBC: 8 10*3/uL (ref 4.0–10.5)

## 2015-05-13 LAB — URINALYSIS, ROUTINE W REFLEX MICROSCOPIC
BILIRUBIN URINE: NEGATIVE
Glucose, UA: NEGATIVE mg/dL
Hgb urine dipstick: NEGATIVE
Ketones, ur: NEGATIVE mg/dL
LEUKOCYTES UA: NEGATIVE
NITRITE: NEGATIVE
PH: 6 (ref 5.0–8.0)
Protein, ur: 30 mg/dL — AB
SPECIFIC GRAVITY, URINE: 1.02 (ref 1.005–1.030)

## 2015-05-13 LAB — URINE MICROSCOPIC-ADD ON
BACTERIA UA: NONE SEEN
RBC / HPF: NONE SEEN RBC/hpf (ref 0–5)
SQUAMOUS EPITHELIAL / LPF: NONE SEEN

## 2015-05-13 LAB — I-STAT CG4 LACTIC ACID, ED
Lactic Acid, Venous: 2.08 mmol/L (ref 0.5–2.0)
Lactic Acid, Venous: 0.7 mmol/L (ref 0.5–2.0)

## 2015-05-13 MED ORDER — SODIUM CHLORIDE 0.9 % IV BOLUS (SEPSIS)
1000.0000 mL | INTRAVENOUS | Status: AC
  Administered 2015-05-13 (×2): 1000 mL via INTRAVENOUS

## 2015-05-13 NOTE — Discharge Instructions (Signed)
Take Tylenol every 4 hours for fever. Drink plenty of fluid. See her doctor as needed for problems.   Fever, Adult A fever is an increase in the body's temperature. It is often defined as a temperature of 100 F (38C) or higher. Short mild or moderate fevers often have no long-term effects. They also often do not need treatment. Moderate or high fevers may make you feel uncomfortable. Sometimes, they can also be a sign of a serious illness or disease. The sweating that may happen with repeated fevers or fevers that last a while may also cause you to not have enough fluid in your body (dehydration). You can take your temperature with a thermometer to see if you have a fever. A measured temperature can change with:  Age.  Time of day.  Where the thermometer is placed:  Mouth (oral).  Rectum (rectal).  Ear (tympanic).  Underarm (axillary).  Forehead (temporal). HOME CARE Pay attention to any changes in your symptoms. Take these actions to help with your condition:  Take over-the-counter and prescription medicines only as told by your doctor. Follow the dosing instructions carefully.  If you were prescribed an antibiotic medicine, take it as told by your doctor. Do not stop taking the antibiotic even if you start to feel better.  Rest as needed.  Drink enough fluid to keep your pee (urine) clear or pale yellow.  Sponge yourself or bathe with room-temperature water as needed. This helps to lower your body temperature . Do not use ice water.  Do not wear too many blankets or heavy clothes. GET HELP IF:  You throw up (vomit).  You cannot eat or drink without throwing up.  You have watery poop (diarrhea).  It hurts when you pee.  Your symptoms do not get better with treatment.  You have new symptoms.  You feel very weak. GET HELP RIGHT AWAY IF:  You are short of breath or have trouble breathing.  You are dizzy or you pass out (faint).  You feel confused.  You  have signs of not having enough fluid in your body, such as:  A dry mouth.  Peeing less.  Looking pale.  You have very bad pain in your belly (abdomen).  You keep throwing up or having water poop.  You have a skin rash.  Your symptoms suddenly get worse.   This information is not intended to replace advice given to you by your health care provider. Make sure you discuss any questions you have with your health care provider.   Document Released: 01/16/2008 Document Revised: 12/28/2014 Document Reviewed: 06/02/2014 Elsevier Interactive Patient Education [employer].

## 2015-05-13 NOTE — ED Notes (Signed)
PT RECEIVED VIA EMS FROM [facility] FOR A FEVER SINCE LAST NIGHT. PER FACILITY, HER FEVER WAS 102. PER EMS HER FEVER WAS 100.6, WHICH THEY GAVE HER TYLENOL 1000MG  PTA. PT C/O A DRY COUGH AND RUNNY NOSE. DENIES PAIN AT THIS TIME.

## 2015-05-13 NOTE — ED Notes (Addendum)
Abnormal lab results given to Dr Eulis Foster

## 2015-05-13 NOTE — ED Notes (Signed)
Pt was soiled with urine and bm. Cleaned pt up

## 2015-05-13 NOTE — ED Notes (Signed)
Bed: [id number] Expected date: 05/13/15 Expected time:  Means of arrival:  Comments: 82yo with Fever

## 2015-05-13 NOTE — ED Provider Notes (Signed)
CSN: [id number]     Arrival date & time 05/13/15  1724 History   First MD Initiated Contact with Patient 05/13/15 1743     Chief Complaint  Patient presents with  . Fever    102 FROM [facility]     (Consider location/radiation/quality/duration/timing/severity/associated sxs/prior Treatment) HPI   Rebecca Knight is a 82 y.o. female resents from assisted living facility, where her temperature was 102, for evaluation. She is unable to give history. EMS treated her with Tylenol. It is reported that she has had a "dry cough and runny nose".  Level V caveat- confusion   Past Medical History  Diagnosis Date  . Hypertension   . Hyperlipidemia   . Heart murmur   . Arthritis   . AAA (abdominal aortic aneurysm) ([place])     [name]  . Varicose veins   . Anxiety   . Osteoporosis   . History of colon cancer 1995    Dr Lennie Hummer  . Herpes zoster of eye 2002    Dr. Herbert Deaner  . Post herpetic neuralgia   . Diverticulosis of colon   . Calculus of gallbladder without mention of cholecystitis or obstruction   . Cancer [facility])     Colon and Rectal   Past Surgical History  Procedure Laterality Date  . Tonsillectomy    . Colon surgery      RESECTION in 1990s  . Eye surgery      CAT EXT  OU  . Endovascular stent insertion  08/08/2011    Procedure: ENDOVASCULAR STENT GRAFT INSERTION;  Surgeon: [name], MD;  Location: [facility] OR;  Service: Vascular;  Laterality: N/A;  Endo Vascular Aortic stent placement with gore   Family History  Problem Relation Age of Onset  . Hypertension Father   . Heart attack Father 37  . Diabetes Sister   . Heart attack Sister 102     her twin  . Diabetes Brother   . Heart attack Brother     in 37s  . Anesthesia problems Neg Hx   . Hypotension Neg Hx   . Malignant hyperthermia Neg Hx   . Pseudochol deficiency Neg Hx   . Brain cancer Daughter   . Stroke Mother     > 43   Social History  Substance Use Topics  . Smoking status: Never Smoker    . Smokeless tobacco: Never Used  . Alcohol Use: No   OB History    No data available     Review of Systems  Unable to perform ROS: Dementia      Allergies  Penicillins  Home Medications   Prior to Admission medications   Medication Sig Start Date End Date Taking? Authorizing Provider  losartan (COZAAR) 100 MG tablet Take 1 tablet by mouth daily 11/26/13  Yes Historical Provider, MD  metoprolol succinate (TOPROL-XL) 50 MG 24 hr tablet Take 50 mg by mouth daily.  05/03/15  Yes Historical Provider, MD  oxybutynin (DITROPAN-XL) 5 MG 24 hr tablet Take 5 mg by mouth daily.   Yes Historical Provider, MD  Polyethyl Glycol-Propyl Glycol (SYSTANE) 0.4-0.3 % SOLN Apply 1 drop to eye 2 (two) times daily. Reported on 05/13/2015   Yes Historical Provider, MD  pravastatin (PRAVACHOL) 20 MG tablet 1 BY MOUTH DAILY Patient taking differently: Take 20 mg by mouth daily.  10/04/13  Yes [name], MD  aspirin EC 81 MG EC tablet Take 1 tablet (81 mg total) by mouth daily. Patient not  taking: Reported on 05/13/2015 12/26/14   Verlee Monte, MD   BP 122/61 mmHg  Pulse 58  Temp(Src) 98.9 F (37.2 C) (Oral)  Resp 20  Ht 5\' 4"  (1.626 m)  Wt 116 lb (52.617 kg)  BMI 19.90 kg/m2  SpO2 95% Physical Exam  Constitutional: She appears well-developed.  Elderly, frail, smells of old urine.  HENT:  Head: Normocephalic and atraumatic.  Right Ear: External ear normal.  Left Ear: External ear normal.  Eyes: Conjunctivae and EOM are normal. Pupils are equal, round, and reactive to light.  Neck: Normal range of motion and phonation normal. Neck supple.  Cardiovascular: Normal rate, regular rhythm and normal heart sounds.   Pulmonary/Chest: Effort normal and breath sounds normal. She exhibits no bony tenderness.  Abdominal: Soft. There is no tenderness. There is no guarding.  Musculoskeletal: Normal range of motion.  Neurological: She is alert. No cranial nerve deficit or sensory deficit. She exhibits  normal muscle tone. Coordination normal.  Skin: Skin is warm, dry and intact.  Psychiatric: She has a normal mood and affect. Her behavior is normal.  Nursing note and vitals reviewed.   ED Course  Procedures (including critical care time)  Medications  sodium chloride 0.9 % bolus 1,000 mL (0 mLs Intravenous Stopped 05/13/15 2108)    Patient Vitals for the past 24 hrs:  BP Temp Temp src Pulse Resp SpO2 Height Weight  05/13/15 2145 122/61 mmHg - - (!) 58 20 95 % - -  05/13/15 2130 125/60 mmHg - - (!) 59 19 95 % - -  05/13/15 2115 125/64 mmHg - - 60 20 93 % - -  05/13/15 2100 117/55 mmHg - - 61 21 93 % - -  05/13/15 2045 113/60 mmHg - - 63 22 95 % - -  05/13/15 2030 122/62 mmHg - - (!) 57 16 97 % - -  05/13/15 2015 121/62 mmHg - - 63 20 95 % - -  05/13/15 2000 137/71 mmHg - - 61 20 98 % - -  05/13/15 1945 133/74 mmHg - - 62 19 97 % - -  05/13/15 1930 158/68 mmHg - - 66 22 100 % - -  05/13/15 1929 142/86 mmHg 98.9 F (37.2 C) Oral 70 23 97 % - -  05/13/15 1755 - 99.6 F (37.6 C) Rectal - - - - -  05/13/15 1745 158/84 mmHg 99.8 F (37.7 C) Oral 82 15 97 % 5\' 4"  (1.626 m) 116 lb (52.617 kg)  05/13/15 1739 - - - - - 95 % - -    10:29 PM Reevaluation with update and discussion. After initial assessment and treatment, an updated evaluation reveals patient is now alert, conversant, however, confused. She denies pain. Cass Vandermeulen L    Labs Review Labs Reviewed  COMPREHENSIVE METABOLIC PANEL - Abnormal; Notable for the following:    Glucose, Bld 163 (*)    Total Protein 6.3 (*)    ALT 10 (*)    GFR calc non Af Amer 51 (*)    GFR calc Af Amer 59 (*)    All other components within normal limits  CBC WITH DIFFERENTIAL/PLATELET - Abnormal; Notable for the following:    Platelets 142 (*)    All other components within normal limits  URINALYSIS, ROUTINE W REFLEX MICROSCOPIC (NOT AT [facility]) - Abnormal; Notable for the following:    Protein, ur 30 (*)    All other components within  normal limits  I-STAT CG4 LACTIC ACID, ED - Abnormal; Notable for  the following:    Lactic Acid, Venous 2.08 (*)    All other components within normal limits  CULTURE, BLOOD (ROUTINE X 2)  CULTURE, BLOOD (ROUTINE X 2)  URINE CULTURE  URINE MICROSCOPIC-ADD ON  I-STAT CG4 LACTIC ACID, ED    Imaging Review Dg Chest Port 1 View  05/13/2015  CLINICAL DATA:  82 year old female with fever and nonproductive cough. Initial encounter. EXAM: PORTABLE CHEST 1 VIEW COMPARISON:  08/08/2011. FINDINGS: Portable AP semi upright view at 1805 hours. Chronically tortuous and extensively calcified thoracic aorta. Other mediastinal contours are within normal limits. Improved lung volumes compared to 2013. Allowing for portable technique, the lungs are clear. No pneumothorax or pleural effusion. Partially visible abdominal aortic endograft. IMPRESSION: No acute cardiopulmonary abnormality. Chronically tortuous and calcified thoracic aorta. Electronically Signed   By: Genevie Ann M.D.   On: 05/13/2015 18:36   I have personally reviewed and evaluated these images and lab results as part of my medical decision-making.   EKG Interpretation None      MDM   Final diagnoses:  Fever, unspecified fever cause    Fever without evidence for seizures bacterial infection or sepsis. Minimally elevated lactate, improved after fluids.  Nursing Notes Reviewed/ Care Coordinated Applicable Imaging Reviewed Interpretation of Laboratory Data incorporated into ED treatment  The patient appears reasonably screened and/or stabilized for discharge and I doubt any other medical condition or other [facility] requiring further screening, evaluation, or treatment in the ED at this time prior to discharge.  Plan: Home Medications- Tylenol prn; Home Treatments- rest, fluids; return here if the recommended treatment, does not improve the symptoms; Recommended follow up- PCP prn   Daleen Bo, MD 05/13/15 2235

## 2015-05-15 LAB — URINE CULTURE: Culture: NO GROWTH

## 2015-05-16 ENCOUNTER — Emergency Department (HOSPITAL_COMMUNITY): Payer: Medicare Other

## 2015-05-16 ENCOUNTER — Encounter (HOSPITAL_COMMUNITY): Payer: Self-pay | Admitting: Emergency Medicine

## 2015-05-16 ENCOUNTER — Emergency Department (HOSPITAL_COMMUNITY)
Admission: EM | Admit: 2015-05-16 | Discharge: 2015-05-16 | Disposition: A | Discharge status: Home / Self Care | Payer: Medicare Other | Attending: Emergency Medicine | Admitting: Emergency Medicine

## 2015-05-16 DIAGNOSIS — J208 Acute bronchitis due to other specified organisms: Secondary | ICD-10-CM | POA: Diagnosis not present

## 2015-05-16 DIAGNOSIS — Z8659 Personal history of other mental and behavioral disorders: Secondary | ICD-10-CM | POA: Diagnosis not present

## 2015-05-16 DIAGNOSIS — Z8719 Personal history of other diseases of the digestive system: Secondary | ICD-10-CM | POA: Insufficient documentation

## 2015-05-16 DIAGNOSIS — I10 Essential (primary) hypertension: Secondary | ICD-10-CM | POA: Insufficient documentation

## 2015-05-16 DIAGNOSIS — Z8619 Personal history of other infectious and parasitic diseases: Secondary | ICD-10-CM | POA: Insufficient documentation

## 2015-05-16 DIAGNOSIS — Z85038 Personal history of other malignant neoplasm of large intestine: Secondary | ICD-10-CM | POA: Diagnosis not present

## 2015-05-16 DIAGNOSIS — E785 Hyperlipidemia, unspecified: Secondary | ICD-10-CM | POA: Insufficient documentation

## 2015-05-16 DIAGNOSIS — Z85048 Personal history of other malignant neoplasm of rectum, rectosigmoid junction, and anus: Secondary | ICD-10-CM | POA: Insufficient documentation

## 2015-05-16 DIAGNOSIS — R011 Cardiac murmur, unspecified: Secondary | ICD-10-CM | POA: Insufficient documentation

## 2015-05-16 DIAGNOSIS — Z880 Allergy status to penicillin: Secondary | ICD-10-CM | POA: Insufficient documentation

## 2015-05-16 DIAGNOSIS — Z8739 Personal history of other diseases of the musculoskeletal system and connective tissue: Secondary | ICD-10-CM | POA: Diagnosis not present

## 2015-05-16 DIAGNOSIS — Z79899 Other long term (current) drug therapy: Secondary | ICD-10-CM | POA: Diagnosis not present

## 2015-05-16 DIAGNOSIS — R05 Cough: Secondary | ICD-10-CM | POA: Diagnosis present

## 2015-05-16 LAB — CBC WITH DIFFERENTIAL/PLATELET
BASOS PCT: 0 %
Basophils Absolute: 0 10*3/uL (ref 0.0–0.1)
EOS ABS: 0 10*3/uL (ref 0.0–0.7)
EOS PCT: 0 %
HCT: 41 % (ref 36.0–46.0)
HEMOGLOBIN: 13.3 g/dL (ref 12.0–15.0)
Lymphocytes Relative: 10 %
Lymphs Abs: 0.9 10*3/uL (ref 0.7–4.0)
MCH: 27.7 pg (ref 26.0–34.0)
MCHC: 32.4 g/dL (ref 30.0–36.0)
MCV: 85.2 fL (ref 78.0–100.0)
MONOS PCT: 9 %
Monocytes Absolute: 0.8 10*3/uL (ref 0.1–1.0)
Neutro Abs: 7.1 10*3/uL (ref 1.7–7.7)
Neutrophils Relative %: 81 %
PLATELETS: 203 10*3/uL (ref 150–400)
RBC: 4.81 MIL/uL (ref 3.87–5.11)
RDW: 13.4 % (ref 11.5–15.5)
WBC: 8.7 10*3/uL (ref 4.0–10.5)

## 2015-05-16 LAB — BASIC METABOLIC PANEL
Anion gap: 13 (ref 5–15)
BUN: 19 mg/dL (ref 6–20)
CALCIUM: 10.3 mg/dL (ref 8.9–10.3)
CO2: 26 mmol/L (ref 22–32)
CREATININE: 1.05 mg/dL — AB (ref 0.44–1.00)
Chloride: 98 mmol/L — ABNORMAL LOW (ref 101–111)
GFR calc non Af Amer: 45 mL/min — ABNORMAL LOW (ref >60)
GFR, EST AFRICAN AMERICAN: 53 mL/min — AB (ref >60)
Glucose, Bld: 140 mg/dL — ABNORMAL HIGH (ref 65–99)
Potassium: 3.5 mmol/L (ref 3.5–5.1)
SODIUM: 137 mmol/L (ref 135–145)

## 2015-05-16 MED ORDER — HYDROCODONE-HOMATROPINE 5-1.5 MG/5ML PO SYRP: 5.0000 mL | ORAL_SOLUTION | Freq: Four times a day (QID) | ORAL | Status: DC | PRN

## 2015-05-16 MED ORDER — ALBUTEROL SULFATE HFA 108 (90 BASE) MCG/ACT IN AERS
2.0000 | INHALATION_SPRAY | Freq: Once | RESPIRATORY_TRACT | Status: AC
  Administered 2015-05-16: 2 via RESPIRATORY_TRACT
  Filled 2015-05-16: qty 6.7

## 2015-05-16 MED ORDER — AEROCHAMBER PLUS W/MASK MISC
1.0000 | Freq: Once | Status: AC
  Administered 2015-05-16: 1
  Filled 2015-05-16: qty 1

## 2015-05-16 MED ORDER — IPRATROPIUM-ALBUTEROL 0.5-2.5 (3) MG/3ML IN SOLN
3.0000 mL | Freq: Once | RESPIRATORY_TRACT | Status: AC
  Administered 2015-05-16: 3 mL via RESPIRATORY_TRACT
  Filled 2015-05-16: qty 3

## 2015-05-16 MED ORDER — HYDROCODONE-HOMATROPINE 5-1.5 MG/5ML PO SYRP
5.0000 mL | ORAL_SOLUTION | Freq: Once | ORAL | Status: AC
  Administered 2015-05-16: 5 mL via ORAL
  Filled 2015-05-16: qty 5

## 2015-05-16 MED ORDER — BENZONATATE 100 MG PO CAPS: 100.0000 mg | ORAL_CAPSULE | Freq: Three times a day (TID) | ORAL | Status: DC

## 2015-05-16 NOTE — ED Notes (Signed)
Patient verbalized understanding of discharge instructions and denies any further needs or questions at this time. VS stable. Patient ambulatory with steady gait. Assisted to ED entrance in wheelchair.   

## 2015-05-16 NOTE — Discharge Instructions (Signed)

## 2015-05-16 NOTE — ED Notes (Signed)
Pt reports congested cough since Saturday. Pt sent here from pcp d/t low o2 levels. Oxygen 97% on RA in triage. Pt denies pain.

## 2015-05-16 NOTE — ED Provider Notes (Signed)
CSN: [id number]     Arrival date & time 05/16/15  1631 History   First MD Initiated Contact with Patient 05/16/15 2024     Chief Complaint  Patient presents with  . Cough     (Consider location/radiation/quality/duration/timing/severity/associated sxs/prior Treatment) HPI  Patient has had cough for over a week. Cough has been dry in nature. She does not think she's had a fever. No associated chest pain. She reports she has had nasal congestion and drainage. She reports cough is worse at night. The patient does live in an assisted living and she reports her roommate has a terrible cough as well. Patient was seen a week ago at the emergency department at that time felt to have fever without source but safe for discharge. By review of that visit, patient reportedly had fever of 102 at her outpatient facility and then in the emergency department was normotensive. Patient reports she was not getting any medications for her symptoms. Past Medical History  Diagnosis Date  . Hypertension   . Hyperlipidemia   . Heart murmur   . Arthritis   . AAA (abdominal aortic aneurysm) ([place])     [name]  . Varicose veins   . Anxiety   . Osteoporosis   . History of colon cancer 1995    Dr Lennie Hummer  . Herpes zoster of eye 2002    Dr. Herbert Deaner  . Post herpetic neuralgia   . Diverticulosis of colon   . Calculus of gallbladder without mention of cholecystitis or obstruction   . Cancer [facility])     Colon and Rectal   Past Surgical History  Procedure Laterality Date  . Tonsillectomy    . Colon surgery      RESECTION in 1990s  . Eye surgery      CAT EXT  OU  . Endovascular stent insertion  08/08/2011    Procedure: ENDOVASCULAR STENT GRAFT INSERTION;  Surgeon: [name], MD;  Location: [facility] OR;  Service: Vascular;  Laterality: N/A;  Endo Vascular Aortic stent placement with gore   Family History  Problem Relation Age of Onset  . Hypertension Father   . Heart attack Father 80  . Diabetes Sister   .  Heart attack Sister 8     her twin  . Diabetes Brother   . Heart attack Brother     in 70s  . Anesthesia problems Neg Hx   . Hypotension Neg Hx   . Malignant hyperthermia Neg Hx   . Pseudochol deficiency Neg Hx   . Brain cancer Daughter   . Stroke Mother     > 80   Social History  Substance Use Topics  . Smoking status: Never Smoker   . Smokeless tobacco: Never Used  . Alcohol Use: No   OB History    No data available     Review of Systems 10 Systems reviewed and are negative for acute change except as noted in the HPI.    Allergies  Penicillins  Home Medications   Prior to Admission medications   Medication Sig Start Date End Date Taking? Authorizing Provider  metoprolol succinate (TOPROL-XL) 50 MG 24 hr tablet Take 50 mg by mouth daily.  05/03/15  Yes Historical Provider, MD  oxybutynin (DITROPAN-XL) 5 MG 24 hr tablet Take 5 mg by mouth daily.   Yes Historical Provider, MD  Polyethyl Glycol-Propyl Glycol (SYSTANE) 0.4-0.3 % SOLN Apply 1 drop to eye 2 (two) times daily. Reported on 05/13/2015   Yes Historical  Provider, MD  pravastatin (PRAVACHOL) 20 MG tablet 1 BY MOUTH DAILY Patient taking differently: Take 20 mg by mouth daily.  10/04/13  Yes [name], MD  benzonatate (TESSALON) 100 MG capsule Take 1 capsule (100 mg total) by mouth every 8 (eight) hours. 05/16/15   Charlesetta Shanks, MD  HYDROcodone-homatropine [facility]) 5-1.5 MG/5ML syrup Take 5 mLs by mouth every 6 (six) hours as needed for cough. 05/16/15   Charlesetta Shanks, MD  losartan (COZAAR) 100 MG tablet Take 1 tablet by mouth daily 11/26/13   Historical Provider, MD   BP 145/86 mmHg  Pulse 90  Temp(Src) 98 F (36.7 C) (Oral)  Resp 20  SpO2 94% Physical Exam  Constitutional: She is oriented to person, place, and time. She appears well-developed and well-nourished.  HENT:  Head: Normocephalic and atraumatic.  Eyes: EOM are normal. Pupils are equal, round, and reactive to light.  Neck: Neck supple.   Cardiovascular: Normal rate, regular rhythm, normal heart sounds and intact distal pulses.   Pulmonary/Chest: Effort normal and breath sounds normal.  Intermittent dry cough particularly with deep inspiration.  Abdominal: Soft. Bowel sounds are normal. She exhibits no distension. There is no tenderness.  Musculoskeletal: Normal range of motion. She exhibits no edema.  Neurological: She is alert and oriented to person, place, and time. She has normal strength. Coordination normal. GCS eye subscore is 4. GCS verbal subscore is 5. GCS motor subscore is 6.  Skin: Skin is warm, dry and intact.  Psychiatric: She has a normal mood and affect.    ED Course  Procedures (including critical care time) Labs Review Labs Reviewed  BASIC METABOLIC PANEL - Abnormal; Notable for the following:    Chloride 98 (*)    Glucose, Bld 140 (*)    Creatinine, Ser 1.05 (*)    GFR calc non Af Amer 45 (*)    GFR calc Af Amer 53 (*)    All other components within normal limits  CBC WITH DIFFERENTIAL/PLATELET    Imaging Review Dg Chest 2 View  05/16/2015  CLINICAL DATA:  Cough for 4 days.  Initial encounter. EXAM: CHEST  2 VIEW COMPARISON:  Single view of the chest 05/13/2015. CT chest 11/12/2010. FINDINGS: The lungs are clear. Heart size is normal. No pneumothorax or pleural effusion. The aorta is tortuous. Aortic stent graft in the upper abdomen is noted. IMPRESSION: No acute disease. Electronically Signed   By: Inge Rise M.D.   On: 05/16/2015 21:16   I have personally reviewed and evaluated these images and lab results as part of my medical decision-making.   EKG Interpretation None      MDM   Final diagnoses:  Viral bronchitis   Patient is well in appearance. She does have frequent dry cough. The cough has been present for approximately week. There is no indication pneumonia on chest x-ray. She does not have leukocytosis or fever. There is no chest pain with cough or shortness of breath. There  is no hypoxia. At this point patient be treated for symptoms with albuterol and Hycodan for comfort. She is counseled on signs and symptoms for which to return.    Charlesetta Shanks, MD 05/16/15 2142

## 2015-05-19 LAB — CULTURE, BLOOD (ROUTINE X 2)
CULTURE: NO GROWTH
CULTURE: NO GROWTH

## 2015-06-01 ENCOUNTER — Ambulatory Visit: Payer: Medicare Other | Attending: Family Medicine | Admitting: Audiology

## 2015-06-01 DIAGNOSIS — Z7189 Other specified counseling: Secondary | ICD-10-CM | POA: Insufficient documentation

## 2015-06-01 NOTE — Progress Notes (Signed)
Patient ID: Rebecca Knight, female   DOB: 1922-08-21, 82 y.o.   MRN: [id number] GRAYCELYNN GROSZ arrived with her daughter in law stating that she "could not hear" and desired a hearing aid.  Since we do not dispense hearing aids Dr. Ival Bible office was contacted. This appointment was cancelled.  An appointment made with [facility] Audiologist for Jun 06, 2015 at 10:15 am (Tel  [phone]).   Deborah L. Heide Spark, Au.D., CCC-A Doctor of Audiology 06/01/2015

## 2015-07-19 ENCOUNTER — Encounter: Payer: Self-pay | Admitting: Neurology

## 2015-07-19 ENCOUNTER — Ambulatory Visit (INDEPENDENT_AMBULATORY_CARE_PROVIDER_SITE_OTHER): Payer: Medicare Other | Admitting: Neurology

## 2015-07-19 VITALS — BP 147/87 | HR 78 | Ht 61.0 in | Wt 110.2 lb

## 2015-07-19 DIAGNOSIS — I639 Cerebral infarction, unspecified: Secondary | ICD-10-CM

## 2015-07-19 DIAGNOSIS — I6381 Other cerebral infarction due to occlusion or stenosis of small artery: Secondary | ICD-10-CM

## 2015-07-19 NOTE — Patient Instructions (Signed)
I had a long d/w patient and daughter about her recent stroke, risk for recurrent stroke/TIAs, personally independently reviewed imaging studies and stroke evaluation results and answered questions.Continue aspirin 81 mg daily  for secondary stroke prevention and maintain strict control of hypertension with blood pressure goal below 130/90, diabetes with hemoglobin A1c goal below 6.5% and lipids with LDL cholesterol goal below 70 mg/dL. I also advised the patient to eat a healthy diet with plenty of whole grains, cereals, fruits and vegetables, exercise regularly and maintain ideal body weight Followup in the future with me in 1 year or call earlier if necessary

## 2015-07-19 NOTE — Progress Notes (Signed)
[facility] [address] [place]. [place] [address] [phone]       OFFICE FOLLOW-UP NOTE  [name] Date of Birth:  May 12, 1922 Medical Record Number:  [id number]   HPI: [name] is a pleasant 82 year old Caucasian lady seen today for the first office follow-up visit following admission to [facility] on 12/23/14 for stroke. Rebecca Knight is a 82 y.o. female with a past medical history of hypertension, dyslipidemia, referred to the emergency department by her primary care physician for further workup of confusion. At baseline she is highly active, independent on activities of daily living and instrumental activities of daily living. Over the past week she has been mildly confused, disoriented, saw her primary care provider who diagnosed with UTI and was given antibiotic therapy with Bactrim. Family members reporting that she is unsteady on her feet appears to have a generalized weakness. She denies focal neurological deficits, recent falls, fevers, chills, nausea, vomiting, abdominal pain or dysuria.She was admitted to the neurology floor unit. CT scan of the brain on admission did not reveal acute abnormality but MRI scan showed acute infarct involving the left external capsule. MRA of the brain did not reveal large vessel stenosis or occlusion. Transthoracic echo showed normal ejection fraction with grade 1 diastolic dysfunction no cardiac source of embolism. Carotid ultrasound showed no significant central stenosis. Hemoglobin A1c was5.5 and LDL cholesterol was 77 mg percent. She was started on aspirin 81 mg daily for stroke prevention and blood pressure and cholesterol medications were continued. The patient's son states that she is doing well and her confusion has improved. She is now using a walker to walk and is getting physical and occupational therapy. She is living in assisted living facility and now her medications are provided under supervision. Patient  herself does not remember she's had a stroke and is unable to remember the details. The patient wants to drive but the son is concerned about this. ROS:   14 system review of systems is positive for murmur, hearing loss, memory loss, confusion, decreased energy and all other systems negative  PMH:  Past Medical History  Diagnosis Date  . Hypertension   . Hyperlipidemia   . Heart murmur   . Arthritis   . AAA (abdominal aortic aneurysm) ([place])     [name]  . Varicose veins   . Anxiety   . Osteoporosis   . History of colon cancer 1995    Dr Lennie Hummer  . Herpes zoster of eye 2002    Dr. Herbert Deaner  . Post herpetic neuralgia   . Diverticulosis of colon   . Calculus of gallbladder without mention of cholecystitis or obstruction   . Cancer [facility])     Colon and Rectal    Social History:  Social History   Social History  . Marital Status: Widowed    Spouse Name: N/A  . Number of Children: 2  . Years of Education: N/A   Occupational History  . retired    Social History Main Topics  . Smoking status: Never Smoker   . Smokeless tobacco: Never Used  . Alcohol Use: No  . Drug Use: No  . Sexual Activity: Not on file   Other Topics Concern  . Not on file   Social History Narrative    Medications:   Current Outpatient Prescriptions on File Prior to Visit  Medication Sig Dispense Refill  . losartan (COZAAR) 100 MG tablet Take 1 tablet by mouth daily    .  metoprolol succinate (TOPROL-XL) 50 MG 24 hr tablet Take 50 mg by mouth daily.   10  . Polyethyl Glycol-Propyl Glycol (SYSTANE) 0.4-0.3 % SOLN Apply 1 drop to eye 2 (two) times daily. Reported on 05/13/2015    . pravastatin (PRAVACHOL) 20 MG tablet 1 BY MOUTH DAILY (Patient taking differently: Take 20 mg by mouth daily. ) 90 tablet 1   No current facility-administered medications on file prior to visit.    Allergies:   Allergies  Allergen Reactions  . Penicillins Rash    Has patient had a PCN reaction causing immediate  rash, facial/tongue/throat swelling, SOB or lightheadedness with hypotension: Yes Has patient had a PCN reaction causing severe rash involving mucus membranes or skin necrosis: Yes Has patient had a PCN reaction that required hospitalization No Has patient had a PCN reaction occurring within the last 10 years: No If all of the above answers are "NO", then may proceed with Cephalosporin use.      Physical Exam General: Frail elderly Caucasian lady, seated, in no evident distress Head: head normocephalic and atraumatic.  Neck: supple with no carotid or supraclavicular bruits Cardiovascular: regular rate and rhythm, no murmurs Musculoskeletal: no deformity Skin:  no rash/petichiae Vascular:  Normal pulses all extremities Filed Vitals:   07/19/15 1055  BP: 147/87  Pulse: 78   Neurologic Exam Mental Status: Awake and fully alert. Oriented to place and time. Recent and remote memory intact. Attention span, concentration and fund of knowledge appropriate. Mood and affect appropriate.  Cranial Nerves: Fundoscopic exam reveals sharp disc margins. Pupils equal, briskly reactive to light. Extraocular movements full without nystagmus. Visual fields full to confrontation. Hearing severely diminished bilaterally. Facial sensation intact. Face, tongue, palate moves normally and symmetrically.  Motor: Normal bulk and tone. Normal strength in all tested extremity muscles. Sensory.: intact to touch ,pinprick .position and vibratory sensation.  Coordination: Rapid alternating movements normal in all extremities. Finger-to-nose and heel-to-shin performed accurately bilaterally. Gait and Station: Arises from chair without difficulty. Stance is broad based. Gait demonstrates normal stride length and balance but needs a walker. . Not able to heel, toe and tandem walk .  Reflexes: 1+ and symmetric. Toes downgoing.   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     PLAN: I had a long d/w patient and son about her recent stroke, risk for recurrent stroke/TIAs, personally independently reviewed imaging studies and stroke evaluation results and answered questions.Continue aspirin 81 mg orally every day  for secondary stroke prevention and maintain strict control of hypertension with blood pressure goal below 130/90, diabetes with hemoglobin A1c goal below 6.5% and lipids with LDL cholesterol goal below 100 mg/dL. I also advised the patient to eat a healthy diet with plenty of whole grains, cereals, fruits and vegetables, exercise regularly and maintain ideal body weight .continue ongoing physical and occupational therapy. I also counseled the patient not to drive till she recovers and is doing better. She may consider referral to driving motor school to get real time assessment of her driving skills on the road before making a final decision.Greater than 50% of time during this 25 minute visit was spent on counseling, discussion with patient and family and coordination of care  .Followup in the future with me in 6 months or call earlier if necessary Antony Contras, MD   Note: This document was prepared with digital dictation and possible smart  Company secretary. Any transcriptional errors that result from this process are unintentional  [facility] [place]. [place] [address] [phone]       OFFICE FOLLOW-UP NOTE  [name] Date of Birth:  24-May-1922 Medical Record Number:  [id number]   HPI: [name] is a pleasant 82 year old Caucasian lady seen today for the first office follow-up visit following admission to [facility] on 12/23/14 for stroke. Rebecca Knight is a 82 y.o. female with a past medical history of hypertension, dyslipidemia, referred to the emergency department by her primary care physician for further workup of confusion. At  baseline she is highly active, independent on activities of daily living and instrumental activities of daily living. Over the past week she has been mildly confused, disoriented, saw her primary care provider who diagnosed with UTI and was given antibiotic therapy with Bactrim. Family members reporting that she is unsteady on her feet appears to have a generalized weakness. She denies focal neurological deficits, recent falls, fevers, chills, nausea, vomiting, abdominal pain or dysuria.She was admitted to the neurology floor unit. CT scan of the brain on admission did not reveal acute abnormality but MRI scan showed acute infarct involving the left external capsule. MRA of the brain did not reveal large vessel stenosis or occlusion. Transthoracic echo showed normal ejection fraction with grade 1 diastolic dysfunction no cardiac source of embolism. Carotid ultrasound showed no significant central stenosis. Hemoglobin A1c was5.5 and LDL cholesterol was 77 mg percent. She was started on aspirin 81 mg daily for stroke prevention and blood pressure and cholesterol medications were continued. The patient's son states that she is doing well and her confusion has improved. She is now using a walker to walk and is getting physical and occupational therapy. She is living in assisted living facility and now her medications are provided under supervision. Patient herself does not remember she's had a stroke and is unable to remember the details. The patient wants to drive but the son is concerned about this. Update 07/19/15 : She returns for follow-up after last visit 6 months ago. She is accompanied by her daughter. Patient had now moved into assisted living facility and she states she likes it there. She keeps herself busy as there is lot to do. She is no longer driving. She has not had any recurrent stroke or TIA symptoms. She is tolerating aspirin well without bleeding or bruising. She states her blood pressure usually  well controlled though today it is slightly elevated at 147/87 in office. She remains on Pravachol which is tolerating well without myalgias or arthralgias. She has mild decreased hearing but no memory difficulties. She is at no problems with gait or balance or any falls. She has in fact hardly   any complaints today. ROS:   14 system review of systems is positive for  incontinence of bladder, frequency of urination, leg swelling and all other systems negative  PMH:  Past Medical History  Diagnosis Date  . Hypertension   . Hyperlipidemia   . Heart murmur   . Arthritis   . AAA (abdominal aortic aneurysm) ([place])     [name]  . Varicose veins   . Anxiety   . Osteoporosis   . History of colon cancer 1995    Dr Lennie Hummer  . Herpes zoster of eye 2002    Dr. Herbert Deaner  . Post herpetic neuralgia   . Diverticulosis of colon   . Calculus of gallbladder without  mention of cholecystitis or obstruction   . Cancer [facility])     Colon and Rectal    Social History:  Social History   Social History  . Marital Status: Widowed    Spouse Name: N/A  . Number of Children: 2  . Years of Education: N/A   Occupational History  . retired    Social History Main Topics  . Smoking status: Never Smoker   . Smokeless tobacco: Never Used  . Alcohol Use: No  . Drug Use: No  . Sexual Activity: Not on file   Other Topics Concern  . Not on file   Social History Narrative    Medications:   Current Outpatient Prescriptions on File Prior to Visit  Medication Sig Dispense Refill  . losartan (COZAAR) 100 MG tablet Take 1 tablet by mouth daily    . metoprolol succinate (TOPROL-XL) 50 MG 24 hr tablet Take 50 mg by mouth daily.   10  . Polyethyl Glycol-Propyl Glycol (SYSTANE) 0.4-0.3 % SOLN Apply 1 drop to eye 2 (two) times daily. Reported on 05/13/2015    . pravastatin (PRAVACHOL) 20 MG tablet 1 BY MOUTH DAILY (Patient taking differently: Take 20 mg by mouth daily. ) 90 tablet 1   No current  facility-administered medications on file prior to visit.    Allergies:   Allergies  Allergen Reactions  . Penicillins Rash    Has patient had a PCN reaction causing immediate rash, facial/tongue/throat swelling, SOB or lightheadedness with hypotension: Yes Has patient had a PCN reaction causing severe rash involving mucus membranes or skin necrosis: Yes Has patient had a PCN reaction that required hospitalization No Has patient had a PCN reaction occurring within the last 10 years: No If all of the above answers are "NO", then may proceed with Cephalosporin use.      Physical Exam General: Frail elderly Caucasian lady, seated, in no evident distress Head: head normocephalic and atraumatic.  Neck: supple with no carotid or supraclavicular bruits Cardiovascular: regular rate and rhythm, no murmurs Musculoskeletal: no deformity Skin:  no rash/petichiae Vascular:  Normal pulses all extremities Filed Vitals:   07/19/15 1055  BP: 147/87  Pulse: 78   Neurologic Exam Mental Status: Awake and fully alert. Oriented to place and time. Recent and remote memory intact. Attention span, concentration and fund of knowledge appropriate. Mood and affect appropriate.  Cranial Nerves: Fundoscopic exam not done   Pupils equal, briskly reactive to light. Extraocular movements full without nystagmus. Visual fields full to confrontation. Hearing severely diminished bilaterally. Facial sensation intact. Face, tongue, palate moves normally and symmetrically.  Motor: Normal bulk and tone. Normal strength in all tested extremity muscles. Sensory.: intact to touch ,pinprick .position and vibratory sensation.  Coordination: Rapid alternating movements normal in all extremities. Finger-to-nose and heel-to-shin performed accurately bilaterally. Gait and Station: Arises from chair without difficulty. Stance is broad based. Gait demonstrates normal stride length and balance but needs a walker. . Not able to heel,  toe and tandem walk .  Reflexes: 1+ and symmetric. Toes downgoing.      ASSESSMENT: 82 year old Caucasian lady with left external capsule infarct in September 2016 secondary to small vessel disease.     PLAN: I had a long d/w patient and daughter about her recent stroke, risk for recurrent stroke/TIAs, personally independently reviewed imaging studies and stroke evaluation results and answered questions.Continue aspirin 81 mg daily  for secondary stroke prevention and maintain strict control of hypertension with blood pressure goal below 130/90, diabetes with  hemoglobin A1c goal below 6.5% and lipids with LDL cholesterol goal below 70 mg/dL. I also advised the patient to eat a healthy diet with plenty of whole grains, cereals, fruits and vegetables, exercise regularly and maintain ideal body weight Followup in the future with me in 1 year or call earlier if necessary    Antony Contras, MD   Note: This document was prepared with digital dictation and possible smart phrase technology. Any transcriptional errors that result from this process are unintentional

## 2015-11-30 ENCOUNTER — Encounter: Payer: Self-pay | Admitting: Family

## 2015-12-04 ENCOUNTER — Ambulatory Visit (HOSPITAL_COMMUNITY)
Admission: RE | Admit: 2015-12-04 | Discharge: 2015-12-04 | Disposition: A | Discharge status: Home / Self Care | Payer: Medicare Other | Source: Ambulatory Visit | Attending: Surgery | Admitting: Surgery

## 2015-12-04 ENCOUNTER — Encounter: Payer: Self-pay | Admitting: Family

## 2015-12-04 ENCOUNTER — Ambulatory Visit (INDEPENDENT_AMBULATORY_CARE_PROVIDER_SITE_OTHER): Payer: Medicare Other | Admitting: Family

## 2015-12-04 VITALS — BP 175/96 | HR 69 | Temp 97.1°F | Resp 16 | Ht 61.0 in | Wt 115.0 lb

## 2015-12-04 DIAGNOSIS — Z95828 Presence of other vascular implants and grafts: Secondary | ICD-10-CM | POA: Diagnosis not present

## 2015-12-04 DIAGNOSIS — F419 Anxiety disorder, unspecified: Secondary | ICD-10-CM | POA: Diagnosis not present

## 2015-12-04 DIAGNOSIS — I10 Essential (primary) hypertension: Secondary | ICD-10-CM | POA: Insufficient documentation

## 2015-12-04 DIAGNOSIS — E785 Hyperlipidemia, unspecified: Secondary | ICD-10-CM | POA: Diagnosis not present

## 2015-12-04 DIAGNOSIS — I714 Abdominal aortic aneurysm, without rupture, unspecified: Secondary | ICD-10-CM

## 2015-12-04 NOTE — Progress Notes (Signed)
[facility]   [facility]: Follow up s/p EVAR  History of Present Illness  Rebecca Knight is a 82 y.o. (11-Aug-1922) female patient of Dr. [name] who is status post endovascular repair of a AAA in April 2013 who presents for routine follow up.EVAR duplex (Date: 11/08/13) demonstrates: 3.6 cm sac size. Most recent CTA (Date: 11/22/13) demonstrates: no endoleak and 3.8 cm sac size. The patient denies back or abdominal pain. Pt denies allergy to IV dye, denies kidney problems. She does report urinary incontinence.   She lives at [facility], an assisted living facility.   She was admitted to [facility] in September 2016 with a stroke. MRI scan showed acute infarct involving the left external capsule secondary to small vessel disease. Dr. Leonie Man evaluated her in March 2017. He advised aspirin 81 mg daily  for secondary stroke prevention.   She reports a weak feeling in one or both legs, indicates fear of stumbling.  She does not take any antiplatelet nor anticoagulant medication, she does take a daily statin.  Her chief complaint is the post herpetic herpes zoster pain on the left side of her face, states she has an appointment with her PCP this week.  Pt Diabetic: No Pt smoker: non-smoker    Past Medical History:  Diagnosis Date  . AAA (abdominal aortic aneurysm) ([facility])    [name]  . Anxiety   . Arthritis   . Calculus of gallbladder without mention of cholecystitis or obstruction   . Cancer [facility])    Colon and Rectal  . Diverticulosis of colon   . Heart murmur   . Herpes zoster of eye 2002   Dr. Herbert Deaner  . History of colon cancer 1995   Dr Lennie Hummer  . Hyperlipidemia   . Hypertension   . Osteoporosis   . Post herpetic neuralgia   . Varicose veins    Past Surgical History:  Procedure Laterality Date  . COLON SURGERY     RESECTION in 1990s  . ENDOVASCULAR STENT INSERTION  08/08/2011   Procedure: ENDOVASCULAR STENT GRAFT INSERTION;  Surgeon: [name], MD;   Location: [facility] OR;  Service: Vascular;  Laterality: N/A;  Endo Vascular Aortic stent placement with gore  . EYE SURGERY     CAT EXT  OU  . TONSILLECTOMY     Social History Social History  Substance Use Topics  . Smoking status: Never Smoker  . Smokeless tobacco: Never Used  . Alcohol use No   Family History Family History  Problem Relation Age of Onset  . Hypertension Father   . Heart attack Father 50  . Diabetes Sister   . Heart attack Sister 72     her twin  . Diabetes Brother   . Heart attack Brother     in 74s  . Anesthesia problems Neg Hx   . Hypotension Neg Hx   . Malignant hyperthermia Neg Hx   . Pseudochol deficiency Neg Hx   . Brain cancer Daughter   . Stroke Mother     > 57   Current Outpatient Prescriptions on File Prior to Visit  Medication Sig Dispense Refill  . losartan (COZAAR) 100 MG tablet Take 1 tablet by mouth daily    . metoprolol succinate (TOPROL-XL) 50 MG 24 hr tablet Take 50 mg by mouth daily.   10  . Polyethyl Glycol-Propyl Glycol (SYSTANE) 0.4-0.3 % SOLN Apply 1 drop to eye 2 (two) times daily. Reported on 05/13/2015    . pravastatin (PRAVACHOL)  20 MG tablet 1 BY MOUTH DAILY (Patient taking differently: Take 20 mg by mouth daily. ) 90 tablet 1  . PROAIR RESPICLICK 123XX123 (90 Base) MCG/ACT AEPB   10   No current facility-administered medications on file prior to visit.    Allergies  Allergen Reactions  . Penicillins Rash    Has patient had a PCN reaction causing immediate rash, facial/tongue/throat swelling, SOB or lightheadedness with hypotension: Yes Has patient had a PCN reaction causing severe rash involving mucus membranes or skin necrosis: Yes Has patient had a PCN reaction that required hospitalization No Has patient had a PCN reaction occurring within the last 10 years: No If all of the above answers are "NO", then may proceed with Cephalosporin use.       ROS: See HPI for pertinent positives and negatives.  Physical  Examination  Vitals:   12/04/15 1036 12/04/15 1044  BP: (!) 178/86 (!) 175/96  Pulse: 69   Resp: 16   Temp: 97.1 F (36.2 C)   SpO2: 96%   Weight: 115 lb (52.2 kg)   Height: 5\' 1"  (1.549 m)    Body mass index is 21.73 kg/m.  General: A&O x 3, WD.  Pulmonary: Sym exp, good air movt, CTAB, no rales, rhonchi, or wheezing.  Cardiac: RRR, Nl S1, S2, positive murmur  Vascular: Vessel Right Left  Radial 2+Palpable 2+Palpable  Carotid transmitted cardiac murmur Audible,without bruit  Aorta palpable N/A  Femoral 2+Palpable 2+Palpable  Popliteal not palpable not palpable  PT Not Palpable Not Palpable  DP Faintly Palpable 1+Palpable   Gastrointestinal: soft, NTND, -G/R, - HSM, - palpable masses, - CVATB.  Musculoskeletal: M/S 4/5 throughout, Extremities without ischemic changes.  Neurologic: Pain and light touch intact in extremities, Motor exam as listed above. Is hard of hearing.   CTA Abd/Pelvis Duplex (Date: November 22, 2013) 1. Successful EVAR of fusiform infrarenal abdominal aortic aneurysm.  The excluded aneurysm sac continues to decrease in size measuring  3.8 x 3.2 cm today compared to 4.8 x 3.6 cm in May of 2013. No  evidence of endoleak or other complicating feature.  2. Stable juxtarenal aneurysmal dilatation of the abdominal aorta to  3.2 cm in transverse diameter.  3. 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  4. Incompletely imaged ectasia of the ascending thoracic aorta. The  imaged portion measures up to 4 cm in diameter.  5. Extensive atherosclerotic vascular disease including multivessel  coronary artery disease.     Non-Invasive Vascular Imaging  EVAR Duplex (Date: 12/04/15) ABDOMINAL AORTA DUPLEX EVALUATION - POST ENDOVASCULAR REPAIR    INDICATION: Stent repair of abdominal aortic aneurysm     PREVIOUS INTERVENTION(S): Stent repair of abdominal aortic aneurysm on 08/08/11.    DUPLEX EXAM:       DIAMETER AP (cm) DIAMETER TRANSVERSE (cm) VELOCITIES (cm/sec)  Aorta 2.95 3.54 34  Right Common Iliac 1.18  62  Left Common Iliac 1.29  29    Comparison Study       Date DIAMETER AP (cm) DIAMETER TRANSVERSE (cm)  11/28/14 3.18 3.59     ADDITIONAL FINDINGS: . Decreased visualization of the abdominal vasculature due to overlying bowel gas and patient body habitus. . No evidence of extrastent flow noted within the aneurysmal sac.    IMPRESSION: 1.  Patent stent of the abdominal aorta with a maximum diameter measurement of 3.54cm. 2. Proximal edge of endograft possibly not fully attached as seen on two previous exams    Compared  to the previous exam:  No significant change in the maximum diameter of the abdominal aorta when compared to the previous exam.      Medical Decision Making  Rebecca Knight is a 82 y.o. female who presents s/p EVAR (Date: 11/08/13).  Pt is asymptomatic with decreased sac size, based on limited visualization as above. I discussed with the patient the importance of surveillance of the endograft. I discussed with Dr. [name] pt HPI and the results of today's EVAR duplex, see Plan.  Pt saw Dr. [name] in August 2015 after I saw her in July 2015 for further evaluation of the proximal edge of the endo graft which is not fully attached, has soft thrombus visualized on Duplex. She then had a CTA which Dr. [name] evaluated and he then recommended pt have a one year abdominal Duplex.   The next endograft duplex will be scheduled for 12 months.  The patient will follow up with [place] in 12 months with these studies.  I emphasized the importance of maximal medical management including strict control of blood pressure, blood glucose, and lipid levels, antiplatelet agents, obtaining regular exercise, and cessation of smoking.   Thank you for allowing [place] to participate in this patient's care.  Clemon Chambers, RN, MSN, FNP-C [facility]  [place] Office: [facility] Physician: [name]  12/04/2015, 10:52 AM

## 2015-12-04 NOTE — Patient Instructions (Signed)
Before your next abdominal ultrasound:  Take two Extra-Strength Gas-X capsules at bedtime the night before the test. Take another two Extra-Strength Gas-X capsules 3 hours before the test.   

## 2015-12-20 ENCOUNTER — Other Ambulatory Visit: Payer: Self-pay | Admitting: Family

## 2015-12-20 DIAGNOSIS — I714 Abdominal aortic aneurysm, without rupture, unspecified: Secondary | ICD-10-CM

## 2015-12-20 DIAGNOSIS — I6330 Cerebral infarction due to thrombosis of unspecified cerebral artery: Secondary | ICD-10-CM

## 2016-04-18 ENCOUNTER — Inpatient Hospital Stay (HOSPITAL_COMMUNITY)
Admission: EM | Admit: 2016-04-18 | Discharge: 2016-04-23 | DRG: 470 | Disposition: A | Discharge status: Skilled Nursing Facility | Payer: Medicare Other | Attending: Internal Medicine | Admitting: Internal Medicine

## 2016-04-18 ENCOUNTER — Emergency Department (HOSPITAL_COMMUNITY): Payer: Medicare Other

## 2016-04-18 ENCOUNTER — Encounter (HOSPITAL_COMMUNITY): Payer: Self-pay

## 2016-04-18 DIAGNOSIS — Y92238 Other place in hospital as the place of occurrence of the external cause: Secondary | ICD-10-CM | POA: Diagnosis not present

## 2016-04-18 DIAGNOSIS — T84020A Dislocation of internal right hip prosthesis, initial encounter: Secondary | ICD-10-CM | POA: Diagnosis present

## 2016-04-18 DIAGNOSIS — E782 Mixed hyperlipidemia: Secondary | ICD-10-CM | POA: Diagnosis present

## 2016-04-18 DIAGNOSIS — W19XXXA Unspecified fall, initial encounter: Secondary | ICD-10-CM

## 2016-04-18 DIAGNOSIS — E785 Hyperlipidemia, unspecified: Secondary | ICD-10-CM | POA: Diagnosis present

## 2016-04-18 DIAGNOSIS — S73004A Unspecified dislocation of right hip, initial encounter: Secondary | ICD-10-CM | POA: Diagnosis not present

## 2016-04-18 DIAGNOSIS — I10 Essential (primary) hypertension: Secondary | ICD-10-CM | POA: Diagnosis present

## 2016-04-18 DIAGNOSIS — M810 Age-related osteoporosis without current pathological fracture: Secondary | ICD-10-CM | POA: Diagnosis present

## 2016-04-18 DIAGNOSIS — X58XXXA Exposure to other specified factors, initial encounter: Secondary | ICD-10-CM | POA: Diagnosis not present

## 2016-04-18 DIAGNOSIS — Z8781 Personal history of (healed) traumatic fracture: Secondary | ICD-10-CM

## 2016-04-18 DIAGNOSIS — Z880 Allergy status to penicillin: Secondary | ICD-10-CM | POA: Diagnosis not present

## 2016-04-18 DIAGNOSIS — Z8673 Personal history of transient ischemic attack (TIA), and cerebral infarction without residual deficits: Secondary | ICD-10-CM

## 2016-04-18 DIAGNOSIS — S72001A Fracture of unspecified part of neck of right femur, initial encounter for closed fracture: Secondary | ICD-10-CM

## 2016-04-18 DIAGNOSIS — Y792 Prosthetic and other implants, materials and accessory orthopedic devices associated with adverse incidents: Secondary | ICD-10-CM | POA: Diagnosis present

## 2016-04-18 DIAGNOSIS — Z7982 Long term (current) use of aspirin: Secondary | ICD-10-CM

## 2016-04-18 DIAGNOSIS — I639 Cerebral infarction, unspecified: Secondary | ICD-10-CM | POA: Diagnosis present

## 2016-04-18 DIAGNOSIS — W010XXA Fall on same level from slipping, tripping and stumbling without subsequent striking against object, initial encounter: Secondary | ICD-10-CM | POA: Diagnosis present

## 2016-04-18 DIAGNOSIS — Z79891 Long term (current) use of opiate analgesic: Secondary | ICD-10-CM | POA: Diagnosis not present

## 2016-04-18 DIAGNOSIS — Y9389 Activity, other specified: Secondary | ICD-10-CM | POA: Diagnosis not present

## 2016-04-18 DIAGNOSIS — S72111A Displaced fracture of greater trochanter of right femur, initial encounter for closed fracture: Principal | ICD-10-CM | POA: Diagnosis present

## 2016-04-18 DIAGNOSIS — Z96641 Presence of right artificial hip joint: Secondary | ICD-10-CM

## 2016-04-18 LAB — CBC WITH DIFFERENTIAL/PLATELET
Basophils Absolute: 0 10*3/uL (ref 0.0–0.1)
Basophils Relative: 0 %
EOS PCT: 2 %
Eosinophils Absolute: 0.1 10*3/uL (ref 0.0–0.7)
HEMATOCRIT: 38.8 % (ref 36.0–46.0)
Hemoglobin: 12.9 g/dL (ref 12.0–15.0)
LYMPHS ABS: 1.5 10*3/uL (ref 0.7–4.0)
LYMPHS PCT: 27 %
MCH: 28.3 pg (ref 26.0–34.0)
MCHC: 33.2 g/dL (ref 30.0–36.0)
MCV: 85.1 fL (ref 78.0–100.0)
MONO ABS: 0.4 10*3/uL (ref 0.1–1.0)
MONOS PCT: 8 %
NEUTROS ABS: 3.5 10*3/uL (ref 1.7–7.7)
Neutrophils Relative %: 63 %
PLATELETS: 154 10*3/uL (ref 150–400)
RBC: 4.56 MIL/uL (ref 3.87–5.11)
RDW: 14 % (ref 11.5–15.5)
WBC: 5.5 10*3/uL (ref 4.0–10.5)

## 2016-04-18 LAB — URINALYSIS, ROUTINE W REFLEX MICROSCOPIC
Bilirubin Urine: NEGATIVE
Glucose, UA: NEGATIVE mg/dL
Ketones, ur: NEGATIVE mg/dL
Nitrite: POSITIVE — AB
PROTEIN: NEGATIVE mg/dL
SPECIFIC GRAVITY, URINE: 1.006 (ref 1.005–1.030)
SQUAMOUS EPITHELIAL / LPF: NONE SEEN
pH: 7 (ref 5.0–8.0)

## 2016-04-18 LAB — COMPREHENSIVE METABOLIC PANEL
ALT: 14 U/L (ref 14–54)
AST: 23 U/L (ref 15–41)
Albumin: 3.7 g/dL (ref 3.5–5.0)
Alkaline Phosphatase: 57 U/L (ref 38–126)
Anion gap: 8 (ref 5–15)
BUN: 18 mg/dL (ref 6–20)
CHLORIDE: 104 mmol/L (ref 101–111)
CO2: 31 mmol/L (ref 22–32)
CREATININE: 0.9 mg/dL (ref 0.44–1.00)
Calcium: 9.3 mg/dL (ref 8.9–10.3)
GFR, EST NON AFRICAN AMERICAN: 54 mL/min — AB (ref >60)
Glucose, Bld: 112 mg/dL — ABNORMAL HIGH (ref 65–99)
POTASSIUM: 3.7 mmol/L (ref 3.5–5.1)
Sodium: 143 mmol/L (ref 135–145)
TOTAL PROTEIN: 5.7 g/dL — AB (ref 6.5–8.1)
Total Bilirubin: 0.8 mg/dL (ref 0.3–1.2)

## 2016-04-18 LAB — PROTIME-INR
INR: 1.07
PROTHROMBIN TIME: 13.9 s (ref 11.4–15.2)

## 2016-04-18 LAB — TYPE AND SCREEN
ABO/RH(D): O POS
ABO/RH(D): O POS
ANTIBODY SCREEN: NEGATIVE
Antibody Screen: NEGATIVE

## 2016-04-18 LAB — SURGICAL PCR SCREEN
MRSA, PCR: NEGATIVE
STAPHYLOCOCCUS AUREUS: NEGATIVE

## 2016-04-18 LAB — ABO/RH: ABO/RH(D): O POS

## 2016-04-18 MED ORDER — ENOXAPARIN SODIUM 30 MG/0.3ML ~~LOC~~ SOLN: 30.0000 mg | SUBCUTANEOUS | Status: DC

## 2016-04-18 MED ORDER — HYDRALAZINE HCL 20 MG/ML IJ SOLN
10.0000 mg | Freq: Four times a day (QID) | INTRAMUSCULAR | Status: DC | PRN
  Administered 2016-04-18 – 2016-04-19 (×3): 10 mg via INTRAVENOUS
  Filled 2016-04-18 (×3): qty 1

## 2016-04-18 MED ORDER — ENOXAPARIN SODIUM 40 MG/0.4ML ~~LOC~~ SOLN
40.0000 mg | SUBCUTANEOUS | Status: DC
  Administered 2016-04-20 – 2016-04-23 (×4): 40 mg via SUBCUTANEOUS
  Filled 2016-04-18 (×4): qty 0.4

## 2016-04-18 MED ORDER — POLYETHYLENE GLYCOL 3350 17 G PO PACK
17.0000 g | PACK | Freq: Every day | ORAL | Status: DC
  Administered 2016-04-20 – 2016-04-23 (×4): 17 g via ORAL
  Filled 2016-04-18 (×4): qty 1

## 2016-04-18 MED ORDER — LOSARTAN POTASSIUM 50 MG PO TABS
100.0000 mg | ORAL_TABLET | Freq: Every day | ORAL | Status: DC
  Administered 2016-04-20 – 2016-04-23 (×3): 100 mg via ORAL
  Filled 2016-04-18 (×4): qty 2

## 2016-04-18 MED ORDER — MORPHINE SULFATE (PF) 2 MG/ML IV SOLN
2.0000 mg | Freq: Once | INTRAVENOUS | Status: AC
  Administered 2016-04-18: 2 mg via INTRAVENOUS
  Filled 2016-04-18: qty 1

## 2016-04-18 MED ORDER — MORPHINE SULFATE (PF) 2 MG/ML IV SOLN: 0.5000 mg | INTRAVENOUS | Status: DC | PRN

## 2016-04-18 MED ORDER — HYDROCODONE-ACETAMINOPHEN 5-325 MG PO TABS
1.0000 | ORAL_TABLET | Freq: Four times a day (QID) | ORAL | Status: DC | PRN
Start: 2016-04-18 — End: 2016-04-20
  Administered 2016-04-18 – 2016-04-19 (×2): 2 via ORAL
  Filled 2016-04-18: qty 1
  Filled 2016-04-18 (×2): qty 2

## 2016-04-18 MED ORDER — METOPROLOL SUCCINATE ER 50 MG PO TB24
50.0000 mg | ORAL_TABLET | Freq: Every day | ORAL | Status: DC
  Administered 2016-04-19 – 2016-04-23 (×5): 50 mg via ORAL
  Filled 2016-04-18 (×5): qty 1

## 2016-04-18 MED ORDER — MORPHINE SULFATE (PF) 2 MG/ML IV SOLN
2.0000 mg | INTRAVENOUS | Status: DC | PRN
  Administered 2016-04-19 (×2): 2 mg via INTRAVENOUS
  Filled 2016-04-18 (×3): qty 1

## 2016-04-18 MED ORDER — SODIUM CHLORIDE 0.9 % IV SOLN
Freq: Once | INTRAVENOUS | Status: AC
  Administered 2016-04-18: 16:00:00 via INTRAVENOUS

## 2016-04-18 MED ORDER — ACETAMINOPHEN 325 MG PO TABS: 650.0000 mg | ORAL_TABLET | Freq: Four times a day (QID) | ORAL | Status: DC | PRN

## 2016-04-18 NOTE — ED Provider Notes (Addendum)
[facility] DEPT Provider Note   CSN: [id number] Arrival date & time: 04/18/16  1507     History   Chief Complaint Chief Complaint  Patient presents with  . Fall    HPI Rebecca Knight is a 82 y.o. female.  HPI 82 yo F with PMHx as below here with right hip pain s/p fall. Pt states she was walking earlier today when she lost her footing and fell to the ground. She reports immediate Onset of aching, throbbing, right hip pain. She was unable to get up due to this pain and called 911 from the ground. She also states she hit her head but denies any loss of consciousness. She is not on blood thinners. Currently, her pain is 9 out of 10 with any movement and 6 out of 10 with standing still. Denies any distal numbness or weakness. Denies any back pain. No abdominal pain. She was in her usual state of health prior to the fall today. Denies any blood thinner use. Currently she lives in a nursing facility.  Past Medical History:  Diagnosis Date  . AAA (abdominal aortic aneurysm) (HCC)    [name]  . Anxiety   . Arthritis   . Calculus of gallbladder without mention of cholecystitis or obstruction   . Cancer [facility])    Colon and Rectal  . Diverticulosis of colon   . Heart murmur   . Herpes zoster of eye 2002   Dr. Herbert Deaner  . History of colon cancer 1995   Dr Lennie Hummer  . Hyperlipidemia   . Hypertension   . Osteoporosis   . Post herpetic neuralgia   . Varicose veins     Patient Active Problem List   Diagnosis Date Noted  . Confusion   . Stroke ([place])   . Syncope and collapse   . Cerebral thrombosis with cerebral infarction ([place]) 12/25/2014  . Acute encephalopathy 12/23/2014  . Urinary incontinence 02/10/2012  . Abdominal aneurysm without mention of rupture 04/13/2010  . CHOLELITHIASIS 04/13/2010  . NONSPEC ELEVATION OF LEVELS OF TRANSAMINASE/LDH 04/13/2010  . DIVERTICULOSIS, COLON 06/14/2008  . COLON CANCER, HX OF 06/14/2008  . THYROID NODULE, RIGHT 01/26/2007  .  HYPERLIPIDEMIA 01/26/2007  . OSTEOPOROSIS NOS 01/26/2007  . POSTHERPETIC NEURALGIA 11/20/2006  . Essential hypertension 11/20/2006    Past Surgical History:  Procedure Laterality Date  . COLON SURGERY     RESECTION in 1990s  . ENDOVASCULAR STENT INSERTION  08/08/2011   Procedure: ENDOVASCULAR STENT GRAFT INSERTION;  Surgeon: [name], MD;  Location: [facility] OR;  Service: Vascular;  Laterality: N/A;  Endo Vascular Aortic stent placement with gore  . EYE SURGERY     CAT EXT  OU  . TONSILLECTOMY      OB History    No data available       Home Medications    Prior to Admission medications   Medication Sig Start Date End Date Taking? Authorizing Provider  aspirin EC 81 MG tablet Take 81 mg by mouth daily.   Yes Historical Provider, MD  gabapentin (NEURONTIN) 100 MG capsule Take 100-300 mg by mouth 3 (three) times daily. Take 100mg  by mouth at 0800 and 1400 and 300mg  by mouth at 2000   Yes Historical Provider, MD  losartan (COZAAR) 100 MG tablet Take 1 tablet by mouth daily 11/26/13  Yes Historical Provider, MD  metoprolol succinate (TOPROL-XL) 50 MG 24 hr tablet Take 50 mg by mouth daily.  05/03/15  Yes Historical Provider, MD  Polyethyl  Glycol-Propyl Glycol (SYSTANE) 0.4-0.3 % SOLN Apply 1 drop to eye 4 (four) times daily. Reported on 05/13/2015   Yes Historical Provider, MD  pravastatin (PRAVACHOL) 20 MG tablet 1 BY MOUTH DAILY Patient not taking: Reported on 04/18/2016 10/04/13   [name], MD    Family History Family History  Problem Relation Age of Onset  . Hypertension Father   . Heart attack Father 58  . Diabetes Sister   . Heart attack Sister 70     her twin  . Diabetes Brother   . Heart attack Brother     in 30s  . Anesthesia problems Neg Hx   . Hypotension Neg Hx   . Malignant hyperthermia Neg Hx   . Pseudochol deficiency Neg Hx   . Brain cancer Daughter   . Stroke Mother     > 57    Social History Social History  Substance Use Topics  . Smoking  status: Never Smoker  . Smokeless tobacco: Never Used  . Alcohol use No     Allergies   Penicillins   Review of Systems Review of Systems  Constitutional: Negative for chills, fatigue and fever.  HENT: Negative for congestion and rhinorrhea.   Eyes: Negative for visual disturbance.  Respiratory: Negative for cough, shortness of breath and wheezing.   Cardiovascular: Negative for chest pain and leg swelling.  Gastrointestinal: Negative for abdominal pain, diarrhea, nausea and vomiting.  Genitourinary: Negative for dysuria and flank pain.  Musculoskeletal: Positive for arthralgias and gait problem. Negative for neck pain and neck stiffness.  Skin: Negative for rash and wound.  Allergic/Immunologic: Negative for immunocompromised state.  Neurological: Positive for headaches. Negative for syncope and weakness.  All other systems reviewed and are negative.    Physical Exam Updated Vital Signs BP 178/80 (BP Location: Left Arm)   Pulse 64   Temp 97.7 F (36.5 C) (Oral)   Resp 16   SpO2 99%   Physical Exam  Constitutional: She is oriented to person, place, and time. She appears well-developed and well-nourished. No distress.  HENT:  Head: Normocephalic and atraumatic.  Eyes: Conjunctivae are normal.  Neck: Neck supple.  No midline neck TTP. No deformity.  Cardiovascular: Normal rate, regular rhythm and normal heart sounds.  Exam reveals no friction rub.   No murmur heard. Pulmonary/Chest: Effort normal and breath sounds normal. No respiratory distress. She has no wheezes. She has no rales.  Abdominal: Soft. Bowel sounds are normal. She exhibits no distension.  Musculoskeletal: She exhibits no edema.  Neurological: She is alert and oriented to person, place, and time. She exhibits normal muscle tone.  Skin: Skin is warm. Capillary refill takes less than 2 seconds.  Psychiatric: She has a normal mood and affect.  Nursing note and vitals reviewed.   LOWER EXTREMITY EXAM:  RIGHT  INSPECTION & PALPATION: RLE shortened and externally rotated. Exquisite TTP over right lateral hip. No deformity. No open wounds.   SENSORY: sensation is intact to light touch in:  Superficial peroneal nerve distribution (over dorsum of foot) Deep peroneal nerve distribution (over first dorsal web space) Sural nerve distribution (over lateral aspect 5th metatarsal) Saphenous nerve distribution (over medial instep)  MOTOR:  + Motor EHL (great toe dorsiflexion) + FHL (great toe plantar flexion)  + TA (ankle dorsiflexion)  + GSC (ankle plantar flexion)  VASCULAR: 2+ dorsalis pedis and posterior tibialis pulses Capillary refill < 2 sec, toes warm and well-perfused  COMPARTMENTS: Soft, warm, well-perfused No pain with passive extension No  parethesias    ED Treatments / Results  Labs (all labs ordered are listed, but only abnormal results are displayed) Labs Reviewed  COMPREHENSIVE METABOLIC PANEL - Abnormal; Notable for the following:       Result Value   Glucose, Bld 112 (*)    Total Protein 5.7 (*)    GFR calc non Af Amer 54 (*)    All other components within normal limits  CBC WITH DIFFERENTIAL/PLATELET  PROTIME-INR  URINALYSIS, ROUTINE W REFLEX MICROSCOPIC    EKG  EKG Interpretation None       Radiology Dg Chest 1 View  Result Date: 04/18/2016 CLINICAL DATA:  Unwitnessed fall EXAM: CHEST 1 VIEW COMPARISON:  05/16/2015. FINDINGS: The heart size and mediastinal contours are within normal limits. Aortic tortuosity and atherosclerosis noted. Both lungs are clear. The visualized skeletal structures are unremarkable. IMPRESSION: No active disease. Electronically Signed   By: Kerby Moors M.D.   On: 04/18/2016 16:30   Ct Head Wo Contrast  Result Date: 04/18/2016 CLINICAL DATA:  Patient with on witnessed fall.  Prior infarcts. EXAM: CT HEAD WITHOUT CONTRAST TECHNIQUE: Contiguous axial images were obtained from the base of the skull through the vertex  without intravenous contrast. COMPARISON:  CT brain 12/23/2014; MRI head 12/25/2014. FINDINGS: Brain: Ventricles and sulci are prominent compatible with atrophy. Periventricular and subcortical white matter hypodensity compatible with chronic microvascular ischemic changes. Chronic bilateral basal ganglia lacunar infarcts. No evidence for acute cortically based infarct, intracranial hemorrhage, mass lesion or mass-effect. Vascular: Internal carotid arterial vascular calcifications. Skull: Intact.  No displaced fracture. Sinuses/Orbits: Unremarkable orbits. Paranasal sinuses are well aerated. Mastoid air cells are unremarkable. Other: None. IMPRESSION: No acute intracranial process. Cortical atrophy and chronic microvascular ischemic changes. Electronically Signed   By: Lovey Newcomer M.D.   On: 04/18/2016 16:33   Dg Hip Unilat  With Pelvis 2-3 Views Right  Result Date: 04/18/2016 CLINICAL DATA:  Right leg shortening in rotation after fall at nursing facility. EXAM: DG HIP (WITH OR WITHOUT PELVIS) 2-3V RIGHT COMPARISON:  None. FINDINGS: Severely displaced proximal right femoral neck fracture is noted. Right femoral head remains situated within acetabulum. Left hip appears normal. IMPRESSION: Severely displaced proximal right femoral neck fracture. Electronically Signed   By: Marijo Conception, M.D.   On: 04/18/2016 16:22    Procedures Procedures (including critical care time)  Medications Ordered in ED Medications  morphine 2 MG/ML injection 2 mg (2 mg Intravenous Given 04/18/16 1542)  0.9 %  sodium chloride infusion ( Intravenous New Bag/Given 04/18/16 1547)     Initial Impression / Assessment and Plan / ED Course  I have reviewed the triage vital signs and the nursing notes.  Pertinent labs & imaging results that were available during my care of the patient were reviewed by me and considered in my medical decision making (see chart for details).  Clinical Course     82 year old female with past  medical history as above who presents with right hip pain status post mechanical fall. On arrival, right leg shortened and externally rotated. Plain film showed displaced femoral neck fracture. I have consulted Dr. Percell Miller of orthopedics and will admit to hospitalists for further management. He requests transfer to [facility] and Dr. Sharol Given will perform operation tomorrow afternoon. Patient given pain control and IV fluids. Screening lab work is otherwise unremarkable. CT head is negative and she is not on blood thinners. Stable for admission to floor.  Final Clinical Impressions(s) / ED Diagnoses   Final  diagnoses:  Closed fracture of neck of right femur, initial encounter [facility])  Fall, initial encounter      Duffy Bruce, MD 04/18/16 [place], MD 04/18/16 [phone]

## 2016-04-18 NOTE — ED Notes (Signed)
Report given to RN on 5N. [facility] called for transport.

## 2016-04-18 NOTE — ED Notes (Signed)
Bed: [id number] Expected date:  Expected time:  Means of arrival:  Comments: EMS- 82yo F, fall/R deformity noted

## 2016-04-18 NOTE — ED Triage Notes (Signed)
Pt presents from [facility] on [address] after an unwitnessed fall today. Shortening and rotation to R leg. 140mcg of fentanyl given en route. 20G RAC. Alert and oriented.

## 2016-04-18 NOTE — ED Notes (Signed)
Patient transported to X-ray 

## 2016-04-18 NOTE — H&P (Addendum)
[facility] History and Physical  Rebecca Knight [id number] DOB: April 03, 1923 DOA: 04/18/2016  Referring physician:  PCP: [name], DO   Chief Complaint: Fall HPI:   82 year old female with a history of hypertension, dyslipidemia, AAA. history of CVA 12/23/14 [acute infarct involving the left external capsule], who presents to the ER after a fall. Patient had URI-like symptoms just before Christmas. She felt sick for at least 48 hours with cough congestion and low-grade fever. She is a resident of [facility]. She sustained an unwitnessed fall today. Patient was awake oriented and able to provide history. She states that she lost her balance and fell. She does not remember feeling dizzy or lightheaded. Patient is otherwise physically active and ambulates independently ED course-found to be hypertensive with systolic blood pressure greater than 200. Complaining of 10 on 10 pain. Has received 2 doses of IV morphine in the ER. Workup essentially negative other than severely communicated right femoral neck fracture. Chest x-ray negative.On arrival, right leg shortened and externally rotated.consulted Dr. Percell Miller of orthopedics and will admit to hospitalists for further management. He requests transfer to [facility] and Dr. Sharol Given will perform operation 12/29.      Review of Systems: negative for the following  Constitutional: Negative for chills, fatigue and fever.  HENT: Negative for congestion and rhinorrhea.   Eyes: Negative for visual disturbance.  Respiratory: Negative for cough, shortness of breath and wheezing.   Cardiovascular: Negative for chest pain and leg swelling.  Gastrointestinal: Negative for abdominal pain, diarrhea, nausea and vomiting.  Genitourinary: Negative for dysuria and flank pain.  Musculoskeletal: Positive for arthralgias and gait problem. Negative for neck pain and neck stiffness.  Skin: Negative for rash and wound.  Allergic/Immunologic: Negative for  immunocompromised state.  Neurological: Positive for headaches. Negative for syncope and weakness.  All other systems reviewed and are negative    Past Medical History:  Diagnosis Date  . AAA (abdominal aortic aneurysm) (HCC)    [name]  . Anxiety   . Arthritis   . Calculus of gallbladder without mention of cholecystitis or obstruction   . Cancer [facility])    Colon and Rectal  . Diverticulosis of colon   . Heart murmur   . Herpes zoster of eye 2002   Dr. Herbert Deaner  . History of colon cancer 1995   Dr Lennie Hummer  . Hyperlipidemia   . Hypertension   . Osteoporosis   . Post herpetic neuralgia   . Varicose veins      Past Surgical History:  Procedure Laterality Date  . COLON SURGERY     RESECTION in 1990s  . ENDOVASCULAR STENT INSERTION  08/08/2011   Procedure: ENDOVASCULAR STENT GRAFT INSERTION;  Surgeon: [name], MD;  Location: [facility] OR;  Service: Vascular;  Laterality: N/A;  Endo Vascular Aortic stent placement with gore  . EYE SURGERY     CAT EXT  OU  . TONSILLECTOMY        Social History:  reports that she has never smoked. She has never used smokeless tobacco. She reports that she does not drink alcohol or use drugs.    Allergies  Allergen Reactions  . Penicillins Rash    Has patient had a PCN reaction causing immediate rash, facial/tongue/throat swelling, SOB or lightheadedness with hypotension: Yes Has patient had a PCN reaction causing severe rash involving mucus membranes or skin necrosis: Yes Has patient had a PCN reaction that required hospitalization No Has patient had a PCN reaction occurring  within the last 10 years: No If all of the above answers are "NO", then may proceed with Cephalosporin use.      Family History  Problem Relation Age of Onset  . Hypertension Father   . Heart attack Father 23  . Stroke Mother     > 41  . Diabetes Sister   . Heart attack Sister 1     her twin  . Diabetes Brother   . Heart attack Brother     in 103s  .  Brain cancer Daughter   . Anesthesia problems Neg Hx   . Hypotension Neg Hx   . Malignant hyperthermia Neg Hx   . Pseudochol deficiency Neg Hx          Prior to Admission medications   Medication Sig Start Date End Date Taking? Authorizing Provider  aspirin EC 81 MG tablet Take 81 mg by mouth daily.   Yes Historical Provider, MD  gabapentin (NEURONTIN) 100 MG capsule Take 100-300 mg by mouth 3 (three) times daily. Take 100mg  by mouth at 0800 and 1400 and 300mg  by mouth at 2000   Yes Historical Provider, MD  losartan (COZAAR) 100 MG tablet Take 1 tablet by mouth daily 11/26/13  Yes Historical Provider, MD  metoprolol succinate (TOPROL-XL) 50 MG 24 hr tablet Take 50 mg by mouth daily.  05/03/15  Yes Historical Provider, MD  Polyethyl Glycol-Propyl Glycol (SYSTANE) 0.4-0.3 % SOLN Apply 1 drop to eye 4 (four) times daily. Reported on 05/13/2015   Yes Historical Provider, MD  pravastatin (PRAVACHOL) 20 MG tablet 1 BY MOUTH DAILY Patient not taking: Reported on 04/18/2016 10/04/13   [name], MD     Physical Exam: Vitals:   04/18/16 1517  BP: 178/80  Pulse: 64  Resp: 16  Temp: 97.7 F (36.5 C)  TempSrc: Oral  SpO2: 99%      Constitutional: NAD, calm, comfortable Vitals:   04/18/16 1517  BP: 178/80  Pulse: 64  Resp: 16  Temp: 97.7 F (36.5 C)  TempSrc: Oral  SpO2: 99%   Eyes: PERRL, lids and conjunctivae normal ENMT: Mucous membranes are moist. Posterior pharynx clear of any exudate or lesions.Normal dentition.  Neck: normal, supple, no masses, no thyromegaly Respiratory: clear to auscultation bilaterally, no wheezing, no crackles. Normal respiratory effort. No accessory muscle use.  Cardiovascular: Regular rate and rhythm, no murmurs / rubs / gallops. No extremity edema. 2+ pedal pulses. No carotid bruits.  Abdomen: no tenderness, no masses palpated. No hepatosplenomegaly. Bowel sounds positive.  Musculoskeletal: no clubbing / cyanosis. No joint deformity upper and  lower extremities. Good ROM, no contractures. Normal muscle tone.  Skin: no rashes, lesions, ulcers. No induration Neurologic: CN 2-12 grossly intact. Sensation intact, DTR normal. Strength 5/5 in all 4.  Psychiatric: Normal judgment and insight. Alert and oriented x 3. Normal mood.     Labs on Admission: I have personally reviewed following labs and imaging studies  CBC:  Recent Labs Lab 04/18/16 1546  WBC 5.5  NEUTROABS 3.5  HGB 12.9  HCT 38.8  MCV 85.1  PLT 123456    Basic Metabolic Panel:  Recent Labs Lab 04/18/16 1546  NA 143  K 3.7  CL 104  CO2 31  GLUCOSE 112*  BUN 18  CREATININE 0.90  CALCIUM 9.3    GFR: CrCl cannot be calculated (Unknown ideal weight.).  Liver Function Tests:  Recent Labs Lab 04/18/16 1546  AST 23  ALT 14  ALKPHOS 57  BILITOT 0.8  PROT 5.7*  ALBUMIN 3.7   No results for input(s): LIPASE, AMYLASE in the last 168 hours. No results for input(s): AMMONIA in the last 168 hours.  Coagulation Profile:  Recent Labs Lab 04/18/16 1546  INR 1.07   No results for input(s): DDIMER in the last 72 hours.  Cardiac Enzymes: No results for input(s): CKTOTAL, CKMB, CKMBINDEX, TROPONINI in the last 168 hours.  BNP (last 3 results) No results for input(s): PROBNP in the last 8760 hours.  HbA1C: No results for input(s): HGBA1C in the last 72 hours. Lab Results  Component Value Date   HGBA1C 5.5 12/24/2014   HGBA1C 5.7 06/16/2008   HGBA1C 5.5 04/10/2007     CBG: No results for input(s): GLUCAP in the last 168 hours.  Lipid Profile: No results for input(s): CHOL, HDL, LDLCALC, TRIG, CHOLHDL, LDLDIRECT in the last 72 hours.  Thyroid Function Tests: No results for input(s): TSH, T4TOTAL, FREET4, T3FREE, THYROIDAB in the last 72 hours.  Anemia Panel: No results for input(s): VITAMINB12, FOLATE, FERRITIN, TIBC, IRON, RETICCTPCT in the last 72 hours.  Urine analysis:    Component Value Date/Time   COLORURINE YELLOW 05/13/2015  [place] 05/13/2015 1557   LABSPEC 1.020 05/13/2015 1557   PHURINE 6.0 05/13/2015 1557   GLUCOSEU NEGATIVE 05/13/2015 1557   HGBUR NEGATIVE 05/13/2015 1557   BILIRUBINUR NEGATIVE 05/13/2015 1557   KETONESUR NEGATIVE 05/13/2015 1557   PROTEINUR 30 (A) 05/13/2015 1557   UROBILINOGEN 0.2 12/23/2014 1251   NITRITE NEGATIVE 05/13/2015 1557   LEUKOCYTESUR NEGATIVE 05/13/2015 1557    Sepsis Labs: @LABRCNTIP (procalcitonin:4,lacticidven:4) )No results found for this or any previous visit (from the past 240 hour(s)).       Radiological Exams on Admission: Dg Chest 1 View  Result Date: 04/18/2016 CLINICAL DATA:  Unwitnessed fall EXAM: CHEST 1 VIEW COMPARISON:  05/16/2015. FINDINGS: The heart size and mediastinal contours are within normal limits. Aortic tortuosity and atherosclerosis noted. Both lungs are clear. The visualized skeletal structures are unremarkable. IMPRESSION: No active disease. Electronically Signed   By: Kerby Moors M.D.   On: 04/18/2016 16:30   Ct Head Wo Contrast  Result Date: 04/18/2016 CLINICAL DATA:  Patient with on witnessed fall.  Prior infarcts. EXAM: CT HEAD WITHOUT CONTRAST TECHNIQUE: Contiguous axial images were obtained from the base of the skull through the vertex without intravenous contrast. COMPARISON:  CT brain 12/23/2014; MRI head 12/25/2014. FINDINGS: Brain: Ventricles and sulci are prominent compatible with atrophy. Periventricular and subcortical white matter hypodensity compatible with chronic microvascular ischemic changes. Chronic bilateral basal ganglia lacunar infarcts. No evidence for acute cortically based infarct, intracranial hemorrhage, mass lesion or mass-effect. Vascular: Internal carotid arterial vascular calcifications. Skull: Intact.  No displaced fracture. Sinuses/Orbits: Unremarkable orbits. Paranasal sinuses are well aerated. Mastoid air cells are unremarkable. Other: None. IMPRESSION: No acute intracranial process.  Cortical atrophy and chronic microvascular ischemic changes. Electronically Signed   By: Lovey Newcomer M.D.   On: 04/18/2016 16:33   Dg Hip Unilat  With Pelvis 2-3 Views Right  Result Date: 04/18/2016 CLINICAL DATA:  Right leg shortening in rotation after fall at nursing facility. EXAM: DG HIP (WITH OR WITHOUT PELVIS) 2-3V RIGHT COMPARISON:  None. FINDINGS: Severely displaced proximal right femoral neck fracture is noted. Right femoral head remains situated within acetabulum. Left hip appears normal. IMPRESSION: Severely displaced proximal right femoral neck fracture. Electronically Signed   By: Marijo Conception, M.D.   On: 04/18/2016 16:22   Dg Chest 1 View  Result Date: 04/18/2016 CLINICAL DATA:  Unwitnessed fall EXAM: CHEST 1 VIEW COMPARISON:  05/16/2015. FINDINGS: The heart size and mediastinal contours are within normal limits. Aortic tortuosity and atherosclerosis noted. Both lungs are clear. The visualized skeletal structures are unremarkable. IMPRESSION: No active disease. Electronically Signed   By: Kerby Moors M.D.   On: 04/18/2016 16:30   Ct Head Wo Contrast  Result Date: 04/18/2016 CLINICAL DATA:  Patient with on witnessed fall.  Prior infarcts. EXAM: CT HEAD WITHOUT CONTRAST TECHNIQUE: Contiguous axial images were obtained from the base of the skull through the vertex without intravenous contrast. COMPARISON:  CT brain 12/23/2014; MRI head 12/25/2014. FINDINGS: Brain: Ventricles and sulci are prominent compatible with atrophy. Periventricular and subcortical white matter hypodensity compatible with chronic microvascular ischemic changes. Chronic bilateral basal ganglia lacunar infarcts. No evidence for acute cortically based infarct, intracranial hemorrhage, mass lesion or mass-effect. Vascular: Internal carotid arterial vascular calcifications. Skull: Intact.  No displaced fracture. Sinuses/Orbits: Unremarkable orbits. Paranasal sinuses are well aerated. Mastoid air cells are  unremarkable. Other: None. IMPRESSION: No acute intracranial process. Cortical atrophy and chronic microvascular ischemic changes. Electronically Signed   By: Lovey Newcomer M.D.   On: 04/18/2016 16:33   Dg Hip Unilat  With Pelvis 2-3 Views Right  Result Date: 04/18/2016 CLINICAL DATA:  Right leg shortening in rotation after fall at nursing facility. EXAM: DG HIP (WITH OR WITHOUT PELVIS) 2-3V RIGHT COMPARISON:  None. FINDINGS: Severely displaced proximal right femoral neck fracture is noted. Right femoral head remains situated within acetabulum. Left hip appears normal. IMPRESSION: Severely displaced proximal right femoral neck fracture. Electronically Signed   By: Marijo Conception, M.D.   On: 04/18/2016 16:22      EKG: Independently reviewed.  Normal sinus rhythm  Assessment/Plan Principal Problem:   Closed displaced fracture of right femoral neck (HCC) Moderate to high risk given uncontrolled hypertension, recent CVA, history of AAA Perioperative risk was discussed with the patient's son who is willing to proceed with surgery in the morning Most recent echo 12/25/14 showed EF of 60-65% with mild aortic stenosis Most recent imaging of patient's AAA -  showed decrease in size of aneurysmal sac compared to 2015, status post EVAR of fusiform infrarenal abdominal aortic aneurysm, last AAA USG was in 12/04/15.Discussed preop risk with Dr Scot Dock . Patient is low preop risk from AAA rupture standpoint. Patient will be transferred to [facility]-Dr. Sharol Given to operate in the morning    HYPERLIPIDEMIA Hold statin   Essential hypertension Continue beta blocker, ACE inhibitor Prn hydralazine   Stroke (HCC)-2016 Resume aspirin after surgery   DVT prophylaxis: Lovenox     Code Status Orders        Start     Ordered   04/18/16 1725  Full code  Continuous     04/18/16 1726    Code Status History    Date Active Date Inactive Code Status Order ID Comments User Context   12/23/2014  5:26 PM 12/27/2014   6:01 PM DNR [id number]  Kelvin Cellar, MD Inpatient   08/08/2011  4:07 PM 08/09/2011  5:14 PM Full Code [id number]  Royetta Crochet, RN Inpatient       consults called:  Family Communication: Admission, patients condition and plan of care including tests being ordered have been discussed with the patient  who indicates understanding and agree with the plan and Code Status  Admission status: inpatient ????????????????????????  Disposition plan: Further plan will depend as patient's clinical course evolves and further  radiologic and laboratory data become available. Likely home when stable   At the time of admission, it appears that the appropriate admission status for this patient is INPATIENT .Thisis judged to be reasonable and necessary in order to provide the required intensity of service to ensure the patient's safetygiven thepresenting symptoms, physical exam findings, and initial radiographic and laboratory data in the context of their chronic comorbidities.   Reyne Dumas MD [facility] Pager [phone]  If 7PM-7AM, please contact night-coverage [email or web address] Password [email or web address]  04/18/2016, 5:28 PM

## 2016-04-19 ENCOUNTER — Inpatient Hospital Stay (HOSPITAL_COMMUNITY): Payer: Medicare Other | Admitting: Anesthesiology

## 2016-04-19 ENCOUNTER — Inpatient Hospital Stay (HOSPITAL_COMMUNITY): Payer: Medicare Other

## 2016-04-19 ENCOUNTER — Encounter (HOSPITAL_COMMUNITY)
Admission: EM | Disposition: A | Discharge status: Skilled Nursing Facility | Payer: Self-pay | Source: Home / Self Care | Attending: Internal Medicine

## 2016-04-19 ENCOUNTER — Encounter (HOSPITAL_COMMUNITY): Payer: Self-pay | Admitting: *Deleted

## 2016-04-19 DIAGNOSIS — S72001A Fracture of unspecified part of neck of right femur, initial encounter for closed fracture: Secondary | ICD-10-CM

## 2016-04-19 HISTORY — PX: HIP ARTHROPLASTY: SHX981

## 2016-04-19 HISTORY — PX: HIP CLOSED REDUCTION: SHX983

## 2016-04-19 SURGERY — HEMIARTHROPLASTY, HIP, DIRECT ANTERIOR APPROACH, FOR FRACTURE
Anesthesia: General | Site: Hip | Laterality: Right

## 2016-04-19 SURGERY — CLOSED MANIPULATION, JOINT, HIP
Anesthesia: General | Laterality: Right

## 2016-04-19 MED ORDER — PROPOFOL 10 MG/ML IV BOLUS
INTRAVENOUS | Status: DC | PRN
  Administered 2016-04-19: 90 mg via INTRAVENOUS

## 2016-04-19 MED ORDER — CLINDAMYCIN PHOSPHATE 900 MG/50ML IV SOLN
INTRAVENOUS | Status: AC
  Filled 2016-04-19: qty 50

## 2016-04-19 MED ORDER — PHENOL 1.4 % MT LIQD: 1.0000 | OROMUCOSAL | Status: DC | PRN

## 2016-04-19 MED ORDER — ACETAMINOPHEN 500 MG PO TABS: 500.0000 mg | ORAL_TABLET | Freq: Four times a day (QID) | ORAL | 0 refills | Status: DC | PRN

## 2016-04-19 MED ORDER — ASPIRIN EC 325 MG PO TBEC
325.0000 mg | DELAYED_RELEASE_TABLET | Freq: Every day | ORAL | Status: DC
  Administered 2016-04-20: 325 mg via ORAL

## 2016-04-19 MED ORDER — FENTANYL CITRATE (PF) 100 MCG/2ML IJ SOLN
INTRAMUSCULAR | Status: DC | PRN
  Administered 2016-04-19 (×2): 50 ug via INTRAVENOUS

## 2016-04-19 MED ORDER — SODIUM CHLORIDE 0.45 % IV SOLN: INTRAVENOUS | Status: DC

## 2016-04-19 MED ORDER — CLINDAMYCIN PHOSPHATE 600 MG/50ML IV SOLN
600.0000 mg | Freq: Four times a day (QID) | INTRAVENOUS | Status: AC
  Administered 2016-04-19 – 2016-04-20 (×2): 600 mg via INTRAVENOUS
  Filled 2016-04-19 (×2): qty 50

## 2016-04-19 MED ORDER — LIDOCAINE HCL (CARDIAC) 20 MG/ML IV SOLN
INTRAVENOUS | Status: DC | PRN
  Administered 2016-04-19: 60 mg via INTRAVENOUS

## 2016-04-19 MED ORDER — CLINDAMYCIN PHOSPHATE 900 MG/50ML IV SOLN
900.0000 mg | INTRAVENOUS | Status: AC
  Administered 2016-04-20: 900 mg via INTRAVENOUS
  Filled 2016-04-19 (×2): qty 50

## 2016-04-19 MED ORDER — ROCURONIUM BROMIDE 100 MG/10ML IV SOLN
INTRAVENOUS | Status: DC | PRN
  Administered 2016-04-19: 30 mg via INTRAVENOUS

## 2016-04-19 MED ORDER — ONDANSETRON HCL 4 MG/2ML IJ SOLN: 4.0000 mg | Freq: Four times a day (QID) | INTRAMUSCULAR | Status: DC | PRN

## 2016-04-19 MED ORDER — LACTATED RINGERS IV SOLN
INTRAVENOUS | Status: DC
  Administered 2016-04-19 – 2016-04-20 (×2): via INTRAVENOUS

## 2016-04-19 MED ORDER — ACETAMINOPHEN 325 MG PO TABS: 650.0000 mg | ORAL_TABLET | Freq: Four times a day (QID) | ORAL | Status: DC | PRN

## 2016-04-19 MED ORDER — ONDANSETRON HCL 4 MG/2ML IJ SOLN
INTRAMUSCULAR | Status: AC
  Filled 2016-04-19: qty 2

## 2016-04-19 MED ORDER — ONDANSETRON HCL 4 MG/2ML IJ SOLN
INTRAMUSCULAR | Status: DC | PRN
  Administered 2016-04-19: 4 mg via INTRAVENOUS

## 2016-04-19 MED ORDER — METOCLOPRAMIDE HCL 5 MG PO TABS: 5.0000 mg | ORAL_TABLET | Freq: Three times a day (TID) | ORAL | Status: DC | PRN

## 2016-04-19 MED ORDER — ASPIRIN EC 81 MG PO TBEC
81.0000 mg | DELAYED_RELEASE_TABLET | Freq: Every day | ORAL | 1 refills | Status: DC
Start: 2016-04-19 — End: 2016-04-23

## 2016-04-19 MED ORDER — ACETAMINOPHEN 650 MG RE SUPP: 650.0000 mg | Freq: Four times a day (QID) | RECTAL | Status: DC | PRN

## 2016-04-19 MED ORDER — METOCLOPRAMIDE HCL 5 MG/ML IJ SOLN: 5.0000 mg | Freq: Three times a day (TID) | INTRAMUSCULAR | Status: DC | PRN

## 2016-04-19 MED ORDER — LACTATED RINGERS IV SOLN
INTRAVENOUS | Status: DC | PRN
  Administered 2016-04-19: 18:00:00 via INTRAVENOUS

## 2016-04-19 MED ORDER — PHENYLEPHRINE HCL 10 MG/ML IJ SOLN
INTRAVENOUS | Status: DC | PRN
  Administered 2016-04-19: 50 ug/min via INTRAVENOUS

## 2016-04-19 MED ORDER — HYDROCODONE-ACETAMINOPHEN 5-325 MG PO TABS: 1.0000 | ORAL_TABLET | Freq: Four times a day (QID) | ORAL | Status: DC | PRN

## 2016-04-19 MED ORDER — MORPHINE SULFATE (PF) 2 MG/ML IV SOLN
0.5000 mg | INTRAVENOUS | Status: DC | PRN
  Administered 2016-04-19 – 2016-04-20 (×2): 0.5 mg via INTRAVENOUS
  Filled 2016-04-19 (×2): qty 1

## 2016-04-19 MED ORDER — SODIUM CHLORIDE 0.9 % IV SOLN: INTRAVENOUS | Status: DC

## 2016-04-19 MED ORDER — HYDROMORPHONE HCL 1 MG/ML IJ SOLN
INTRAMUSCULAR | Status: AC
  Filled 2016-04-19: qty 0.5

## 2016-04-19 MED ORDER — ENSURE ENLIVE PO LIQD
237.0000 mL | Freq: Two times a day (BID) | ORAL | Status: DC
  Administered 2016-04-22 – 2016-04-23 (×3): 237 mL via ORAL

## 2016-04-19 MED ORDER — CEFAZOLIN SODIUM-DEXTROSE 2-4 GM/100ML-% IV SOLN: 2.0000 g | INTRAVENOUS | Status: DC

## 2016-04-19 MED ORDER — MENTHOL 3 MG MT LOZG: 1.0000 | LOZENGE | OROMUCOSAL | Status: DC | PRN

## 2016-04-19 MED ORDER — PROPOFOL 10 MG/ML IV BOLUS
INTRAVENOUS | Status: DC | PRN
  Administered 2016-04-19: 60 mg via INTRAVENOUS

## 2016-04-19 MED ORDER — ONDANSETRON HCL 4 MG PO TABS: 4.0000 mg | ORAL_TABLET | Freq: Four times a day (QID) | ORAL | Status: DC | PRN

## 2016-04-19 MED ORDER — SUGAMMADEX SODIUM 200 MG/2ML IV SOLN
INTRAVENOUS | Status: DC | PRN
  Administered 2016-04-19: 200 mg via INTRAVENOUS

## 2016-04-19 MED ORDER — CLINDAMYCIN PHOSPHATE 600 MG/50ML IV SOLN
600.0000 mg | INTRAVENOUS | Status: DC
  Filled 2016-04-19: qty 50

## 2016-04-19 MED ORDER — PROPOFOL 10 MG/ML IV BOLUS
INTRAVENOUS | Status: AC
  Filled 2016-04-19: qty 20

## 2016-04-19 MED ORDER — SUGAMMADEX SODIUM 200 MG/2ML IV SOLN
INTRAVENOUS | Status: AC
  Filled 2016-04-19: qty 2

## 2016-04-19 MED ORDER — CLINDAMYCIN PHOSPHATE 900 MG/50ML IV SOLN
INTRAVENOUS | Status: DC | PRN
  Administered 2016-04-19: 900 mg via INTRAVENOUS

## 2016-04-19 MED ORDER — FENTANYL CITRATE (PF) 100 MCG/2ML IJ SOLN
INTRAMUSCULAR | Status: AC
  Filled 2016-04-19: qty 2

## 2016-04-19 MED ORDER — 0.9 % SODIUM CHLORIDE (POUR BTL) OPTIME
TOPICAL | Status: DC | PRN
  Administered 2016-04-19: 1000 mL

## 2016-04-19 MED ORDER — LACTATED RINGERS IV SOLN
INTRAVENOUS | Status: DC
  Administered 2016-04-19 – 2016-04-20 (×3): via INTRAVENOUS

## 2016-04-19 MED ORDER — CLINDAMYCIN PHOSPHATE 900 MG/50ML IV SOLN
900.0000 mg | INTRAVENOUS | Status: DC
  Filled 2016-04-19: qty 50

## 2016-04-19 SURGICAL SUPPLY — 44 items
BLADE SAW SAG 73X25 THK (BLADE) ×2
BLADE SAW SGTL 73X25 THK (BLADE) ×1 IMPLANT
BRUSH FEMORAL CANAL (MISCELLANEOUS) IMPLANT
CAPT HIP HEMI 1 ×3 IMPLANT
COVER SURGICAL LIGHT HANDLE (MISCELLANEOUS) ×3 IMPLANT
DRAPE IMP U-DRAPE 54X76 (DRAPES) ×3 IMPLANT
DRAPE INCISE IOBAN 85X60 (DRAPES) ×6 IMPLANT
DRAPE ORTHO SPLIT 77X108 STRL (DRAPES) ×4
DRAPE SURG ORHT 6 SPLT 77X108 (DRAPES) ×2 IMPLANT
DRAPE U-SHAPE 47X51 STRL (DRAPES) ×3 IMPLANT
DRESSING ALLEVYN LIFE SACRUM (GAUZE/BANDAGES/DRESSINGS) ×3 IMPLANT
DRSG AQUACEL AG ADV 3.5X10 (GAUZE/BANDAGES/DRESSINGS) ×3 IMPLANT
DRSG MEPILEX BORDER 4X8 (GAUZE/BANDAGES/DRESSINGS) ×6 IMPLANT
DURAPREP 26ML APPLICATOR (WOUND CARE) ×3 IMPLANT
ELECT BLADE 6.5 EXT (BLADE) IMPLANT
ELECT CAUTERY BLADE 6.4 (BLADE) IMPLANT
ELECT REM PT RETURN 9FT ADLT (ELECTROSURGICAL) ×3
ELECTRODE REM PT RTRN 9FT ADLT (ELECTROSURGICAL) ×1 IMPLANT
GAUZE SPONGE 4X4 12PLY STRL (GAUZE/BANDAGES/DRESSINGS) ×3 IMPLANT
GLOVE BIOGEL PI IND STRL 9 (GLOVE) ×1 IMPLANT
GLOVE BIOGEL PI INDICATOR 9 (GLOVE) ×2
GLOVE SURG ORTHO 9.0 STRL STRW (GLOVE) ×3 IMPLANT
GOWN STRL REUS W/ TWL XL LVL3 (GOWN DISPOSABLE) ×1 IMPLANT
GOWN STRL REUS W/TWL XL LVL3 (GOWN DISPOSABLE) ×2
HANDPIECE INTERPULSE COAX TIP (DISPOSABLE)
IMMOBILIZER KNEE 20 (SOFTGOODS) ×3 IMPLANT
KIT BASIN OR (CUSTOM PROCEDURE TRAY) ×3 IMPLANT
KIT ROOM TURNOVER OR (KITS) ×3 IMPLANT
MANIFOLD NEPTUNE II (INSTRUMENTS) ×3 IMPLANT
NS IRRIG 1000ML POUR BTL (IV SOLUTION) ×3 IMPLANT
PACK TOTAL JOINT (CUSTOM PROCEDURE TRAY) ×3 IMPLANT
PACK UNIVERSAL I (CUSTOM PROCEDURE TRAY) ×3 IMPLANT
PAD ARMBOARD 7.5X6 YLW CONV (MISCELLANEOUS) ×6 IMPLANT
SET HNDPC FAN SPRY TIP SCT (DISPOSABLE) IMPLANT
STAPLER VISISTAT 35W (STAPLE) ×3 IMPLANT
SUT ETHIBOND NAB CT1 #1 30IN (SUTURE) ×3 IMPLANT
SUT VIC AB 1 CT1 27 (SUTURE) ×2
SUT VIC AB 1 CT1 27XBRD ANBCTR (SUTURE) ×1 IMPLANT
SUT VIC AB 2-0 CTB1 (SUTURE) ×3 IMPLANT
TOWEL OR 17X24 6PK STRL BLUE (TOWEL DISPOSABLE) ×3 IMPLANT
TOWEL OR 17X26 10 PK STRL BLUE (TOWEL DISPOSABLE) ×3 IMPLANT
TOWER CARTRIDGE SMART MIX (DISPOSABLE) IMPLANT
TRAY FOLEY CATH 16FRSI W/METER (SET/KITS/TRAYS/PACK) IMPLANT
WATER STERILE IRR 1000ML POUR (IV SOLUTION) ×9 IMPLANT

## 2016-04-19 NOTE — Progress Notes (Signed)
Initial Nutrition Assessment  DOCUMENTATION CODES:   Not applicable  INTERVENTION:  Once diet advances, provide Ensure Enlive po BID, each supplement provides 350 kcal and 20 grams of protein  NUTRITION DIAGNOSIS:   Increased nutrient needs related to  (post op healing) as evidenced by estimated needs.  GOAL:   Patient will meet greater than or equal to 90% of their needs  MONITOR:   Supplement acceptance, Diet advancement, Labs, Weight trends, Skin, I & O's  REASON FOR ASSESSMENT:   Consult Assessment of nutrition requirement/status  ASSESSMENT:   82 year old female with a history of hypertension, dyslipidemia, AAA. history of CVA 12/23/14 acute infarct involving the left external capsule, who presents to the ER after a fall.  Workup essentially negative other than severely communicated right femoral neck fracture.  Pt is currently NPO for surgery. Pt reports eating well PTA with usual consumption of at least 3 meals a day. Pt report no appetite during time of visit. Noted no new weight recorded, thus pt was weighed on bed scale which revealed 121 lbs. Pt reports usual body weight of ~114 lbs. RD to order Ensure to aid in post op healing. Nursing staff to provide once diet advances.   Nutrition-Focused physical exam completed. Findings are no fat depletion, moderate muscle depletion, and no edema. Depletion likely related to the natural aging process  Labs and medications reviewed.   Diet Order:  Diet NPO time specified  Skin:  Reviewed, no issues  Last BM:  12/27  Height:   Ht Readings from Last 1 Encounters:  12/04/15 5\' 1"  (1.549 m)    Weight:   Wt Readings from Last 1 Encounters:  12/04/15 115 lb (52.2 kg)    Ideal Body Weight:  47.7 kg  BMI:  There is no height or weight on file to calculate BMI.  Estimated Nutritional Needs:   Kcal:  1400-1600  Protein:  55-65 grams  Fluid:  >/= 1.5 L/day  EDUCATION NEEDS:   No education needs identified at  this time  Corrin Parker, MS, RD, LDN Pager # [phone] After hours/ weekend pager # [phone]

## 2016-04-19 NOTE — Op Note (Signed)
Date of Surgery: 04/19/2016  INDICATIONS: Rebecca Knight is a 82 y.o.-year-old female who sustained a mechanical fall sustaining a right femoral neck fracture.Marland Kitchen  PREOPERATIVE DIAGNOSIS: Right femoral neck fracture  POSTOPERATIVE DIAGNOSIS: Same.  PROCEDURE: Hemiarthroplasty right hip   SURGEON: Sharol Given, M.D.  ANESTHESIA:  general  IV FLUIDS AND URINE: See anesthesia.  ESTIMATED BLOOD LOSS: Minimal mL.  COMPLICATIONS: None.  COMPONENT SIZES: Depew size 45 mm ball, +0 neck, size 2 stem  DESCRIPTION OF PROCEDURE: The patient was brought to the operating room and underwent a general anesthetic. After adequate levels of anesthesia were obtained patient was placed in the lateral decubitus position with the hip fracture side up and axillary roll was placed the arms were padded and the mark II supports were placed. The lower extremity was prepped using DuraPrep draped into a sterile field Ioban was used to cover all exposed skin. A timeout was called. A posterior lateral incision was made this was carried down through the tensor fascia lata which was split. Retractors were placed. The piriformis short external rotators and capsule was incised off the femoral neck and retracted with Ethibond suture. The femoral neck cut was made 1 cm proximal to the calcar. The head was delivered. The head was sized and the appropriate size had a good suction fit. The femoral canal was sequentially broached to the proper size. The hip was trialed and this had a good stable fit. The wound was irrigated with normal saline. The femoral component and head were inserted this was again reduced. The hip was stable. The wound was irrigated with normal saline. The capsule short external rotators and piriformis were reapproximated with #1 Ethibond. The tensor fascia lata was closed using #1 Vicryl. Subcutaneous was closed using 0 Vicryl the skin was closed using staples. A Mepilex dressing was applied. Patient was extubated taken to the  PACU in stable condition. Meridee Score, MD [place] 4:34 PM

## 2016-04-19 NOTE — Transfer of Care (Signed)
Immediate Anesthesia Transfer of Care Note  Patient: Rebecca Knight  Procedure(s) Performed: Procedure(s): ARTHROPLASTY BIPOLAR HIP (HEMIARTHROPLASTY) (Right)  Patient Location: PACU  Anesthesia Type:General  Level of Consciousness: awake and patient cooperative  Airway & Oxygen Therapy: Patient Spontanous Breathing and Patient connected to nasal cannula oxygen  Post-op Assessment: Report given to RN  Post vital signs: Reviewed and stable  Last Vitals:  Vitals:   04/19/16 1245 04/19/16 1300  BP: (!) 194/94 (!) 172/83  Pulse: 86 82  Resp: 15 16  Temp:  37.2 C    Last Pain:  Vitals:   04/19/16 1300  TempSrc: Oral  PainSc:       Patients Stated Pain Goal: 3 ([id number])  Complications: No apparent anesthesia complications

## 2016-04-19 NOTE — Progress Notes (Signed)
Patient ID: Rebecca Knight, female   DOB: Jan 12, 1923, 82 y.o.   MRN: [id number] Patients hip has dislocated and she is scheduled for return to the operating room tomorrow for revision surgery.

## 2016-04-19 NOTE — Consult Note (Signed)
ORTHOPAEDIC CONSULTATION  REQUESTING PHYSICIAN: Reyne Dumas, MD  Chief Complaint: Right femoral neck fracture  HPI: Rebecca Knight is a 82 y.o. female who presents with right femoral neck fracture. Patient had a mechanical fall denies any head trauma denies loss of consciousness.  Past Medical History:  Diagnosis Date  . AAA (abdominal aortic aneurysm) (HCC)    [name]  . Anxiety   . Arthritis   . Calculus of gallbladder without mention of cholecystitis or obstruction   . Cancer [facility])    Colon and Rectal  . Diverticulosis of colon   . Heart murmur   . Herpes zoster of eye 2002   Dr. Herbert Deaner  . History of colon cancer 1995   Dr Lennie Hummer  . Hyperlipidemia   . Hypertension   . Osteoporosis   . Post herpetic neuralgia   . Varicose veins    Past Surgical History:  Procedure Laterality Date  . COLON SURGERY     RESECTION in 1990s  . ENDOVASCULAR STENT INSERTION  08/08/2011   Procedure: ENDOVASCULAR STENT GRAFT INSERTION;  Surgeon: [name], MD;  Location: [facility] OR;  Service: Vascular;  Laterality: N/A;  Endo Vascular Aortic stent placement with gore  . EYE SURGERY     CAT EXT  OU  . TONSILLECTOMY     Social History   Social History  . Marital status: Widowed    Spouse name: N/A  . Number of children: 2  . Years of education: N/A   Occupational History  . retired    Social History Main Topics  . Smoking status: Never Smoker  . Smokeless tobacco: Never Used  . Alcohol use No  . Drug use: No  . Sexual activity: No   Other Topics Concern  . None   Social History Narrative  . None   Family History  Problem Relation Age of Onset  . Hypertension Father   . Heart attack Father 83  . Stroke Mother     > 5  . Diabetes Sister   . Heart attack Sister 19     her twin  . Diabetes Brother   . Heart attack Brother     in 37s  . Brain cancer Daughter   . Anesthesia problems Neg Hx   . Hypotension Neg Hx   . Malignant hyperthermia Neg Hx   .  Pseudochol deficiency Neg Hx    - negative except otherwise stated in the family history section Allergies  Allergen Reactions  . Penicillins Rash    Has patient had a PCN reaction causing immediate rash, facial/tongue/throat swelling, SOB or lightheadedness with hypotension: Yes Has patient had a PCN reaction causing severe rash involving mucus membranes or skin necrosis: Yes Has patient had a PCN reaction that required hospitalization No Has patient had a PCN reaction occurring within the last 10 years: No If all of the above answers are "NO", then may proceed with Cephalosporin use.     Prior to Admission medications   Medication Sig Start Date End Date Taking? Authorizing Provider  aspirin EC 81 MG tablet Take 81 mg by mouth daily.   Yes Historical Provider, MD  gabapentin (NEURONTIN) 100 MG capsule Take 100-300 mg by mouth 3 (three) times daily. Take 100mg  by mouth at 0800 and 1400 and 300mg  by mouth at 2000   Yes Historical Provider, MD  losartan (COZAAR) 100 MG tablet Take 1 tablet by mouth daily 11/26/13  Yes Historical Provider, MD  metoprolol succinate (  TOPROL-XL) 50 MG 24 hr tablet Take 50 mg by mouth daily.  05/03/15  Yes Historical Provider, MD  Polyethyl Glycol-Propyl Glycol (SYSTANE) 0.4-0.3 % SOLN Apply 1 drop to eye 4 (four) times daily. Reported on 05/13/2015   Yes Historical Provider, MD  pravastatin (PRAVACHOL) 20 MG tablet 1 BY MOUTH DAILY Patient not taking: Reported on 04/18/2016 10/04/13   [name], MD   Dg Chest 1 View  Result Date: 04/18/2016 CLINICAL DATA:  Unwitnessed fall EXAM: CHEST 1 VIEW COMPARISON:  05/16/2015. FINDINGS: The heart size and mediastinal contours are within normal limits. Aortic tortuosity and atherosclerosis noted. Both lungs are clear. The visualized skeletal structures are unremarkable. IMPRESSION: No active disease. Electronically Signed   By: Kerby Moors M.D.   On: 04/18/2016 16:30   Ct Head Wo Contrast  Result Date:  04/18/2016 CLINICAL DATA:  Patient with on witnessed fall.  Prior infarcts. EXAM: CT HEAD WITHOUT CONTRAST TECHNIQUE: Contiguous axial images were obtained from the base of the skull through the vertex without intravenous contrast. COMPARISON:  CT brain 12/23/2014; MRI head 12/25/2014. FINDINGS: Brain: Ventricles and sulci are prominent compatible with atrophy. Periventricular and subcortical white matter hypodensity compatible with chronic microvascular ischemic changes. Chronic bilateral basal ganglia lacunar infarcts. No evidence for acute cortically based infarct, intracranial hemorrhage, mass lesion or mass-effect. Vascular: Internal carotid arterial vascular calcifications. Skull: Intact.  No displaced fracture. Sinuses/Orbits: Unremarkable orbits. Paranasal sinuses are well aerated. Mastoid air cells are unremarkable. Other: None. IMPRESSION: No acute intracranial process. Cortical atrophy and chronic microvascular ischemic changes. Electronically Signed   By: Lovey Newcomer M.D.   On: 04/18/2016 16:33   Dg Hip Unilat  With Pelvis 2-3 Views Right  Result Date: 04/18/2016 CLINICAL DATA:  Right leg shortening in rotation after fall at nursing facility. EXAM: DG HIP (WITH OR WITHOUT PELVIS) 2-3V RIGHT COMPARISON:  None. FINDINGS: Severely displaced proximal right femoral neck fracture is noted. Right femoral head remains situated within acetabulum. Left hip appears normal. IMPRESSION: Severely displaced proximal right femoral neck fracture. Electronically Signed   By: Marijo Conception, M.D.   On: 04/18/2016 16:22   - pertinent xrays, CT, MRI studies were reviewed and independently interpreted  Positive ROS: All other systems have been reviewed and were otherwise negative with the exception of those mentioned in the HPI and as above.  Physical Exam: General: Alert, no acute distress Psychiatric: Patient is competent for consent with normal mood and affect Lymphatic: No axillary or cervical  lymphadenopathy Cardiovascular: No pedal edema Respiratory: No cyanosis, no use of accessory musculature GI: No organomegaly, abdomen is soft and non-tender  Skin: Patient's skin is intact with no open wounds of cellulitis.   Neurologic: Patient does have protective sensation bilateral lower extremities.   MUSCULOSKELETAL:  On examination patient's right lower extremity her leg is shortened Rotated. Patient has pain with range of motion. Radiographs shows a displaced right femoral neck fracture.  Assessment: Assessment: Displaced right femoral neck fracture.  Plan: Plan: We will plan for right hip hemiarthroplasty. Risk and benefits were discussed including infection neurovascular injury pain dislocation DVT need for additional surgery. Patient states she understands wish to proceed at this time.  Thank you for the consult and the opportunity to see Ms. Abram Sander, MD [employer] [phone] 6:20 AM

## 2016-04-19 NOTE — Progress Notes (Signed)
PROGRESS NOTE    Rebecca Knight  [id number] DOB: June 25, 1922 DOA: 04/18/2016 PCP: [name], DO  Brief Narrative: 82 year old female with a history of hypertension, dyslipidemia, AAA. history of CVA 12/23/14 [acute infarct involving the left external capsule], who presents to the ER after a fall. She sustained an unwitnessed fall 12/28, Xray revealed severely communicated right femoral neck fracture.   Assessment & Plan:   Closed displaced fracture of right femoral neck (HCC) -Moderate to high risk given uncontrolled hypertension, recent CVA, history of AAA -Most recent echo 12/25/14 showed EF of 60-65% with mild aortic stenosis -h/o AAA - status post EVAR of fusiform infrarenal abdominal aortic aneurysm, last AAA USG was in 12/04/15.Dr.Abrol discussed preop risk with Dr Scot Dock . Patient is low preop risk from AAA rupture standpoint. -plan for OR today -lovenox post op for dvt prop -will likely need SNF   HYPERLIPIDEMIA -Hold statin   Essential hypertension -BP better, Continue beta blocker, ACE inhibitor Prn hydralazine   h/o Stroke (HCC)-2016 -Resume aspirin after surgery  DVT prophylaxis: Lovenox  Full Code Family Communication:None at bedside Disposition plan: will likely need SNF   Consultants:   Ortho  Procedures:  Subjective: Feels ok, some hip pain  Objective: Vitals:   04/18/16 2149 04/19/16 0000 04/19/16 0408 04/19/16 0445  BP: (!) 166/63 (!) 142/65 (!) 190/86 (!) 145/67  Pulse: 76 73 92   Resp: 20 20 20    Temp: 98.2 F (36.8 C) 98.6 F (37 C) 98.7 F (37.1 C)   TempSrc: Oral Oral Oral   SpO2: 97% 95% 94%     Intake/Output Summary (Last 24 hours) at 04/19/16 1151 Last data filed at 04/19/16 0900  Gross per 24 hour  Intake                0 ml  Output                0 ml  Net                0 ml   There were no vitals filed for this visit.  Examination:  General exam: Appears calm and comfortable, no distress Respiratory system:  Clear to auscultation. Respiratory effort normal. Cardiovascular system: S1 & S2 heard, RRR. No JVD, murmurs, rubs, gallops or clicks. No pedal edema. Gastrointestinal system: Abdomen is nondistended, soft and nontender. No organomegaly or masses felt. Normal bowel sounds heard. Central nervous system: Alert and oriented. No focal neurological deficits. Extremities: R leg shortened and ext rotated Skin: No rashes, lesions or ulcers Psychiatry: Judgement and insight appear normal. Mood & affect appropriate.     Data Reviewed: I have personally reviewed following labs and imaging studies  CBC:  Recent Labs Lab 04/18/16 1546  WBC 5.5  NEUTROABS 3.5  HGB 12.9  HCT 38.8  MCV 85.1  PLT 123456   Basic Metabolic Panel:  Recent Labs Lab 04/18/16 1546  NA 143  K 3.7  CL 104  CO2 31  GLUCOSE 112*  BUN 18  CREATININE 0.90  CALCIUM 9.3   GFR: CrCl cannot be calculated (Unknown ideal weight.). Liver Function Tests:  Recent Labs Lab 04/18/16 1546  AST 23  ALT 14  ALKPHOS 57  BILITOT 0.8  PROT 5.7*  ALBUMIN 3.7   No results for input(s): LIPASE, AMYLASE in the last 168 hours. No results for input(s): AMMONIA in the last 168 hours. Coagulation Profile:  Recent Labs Lab 04/18/16 1546  INR 1.07   Cardiac Enzymes: No results  for input(s): CKTOTAL, CKMB, CKMBINDEX, TROPONINI in the last 168 hours. BNP (last 3 results) No results for input(s): PROBNP in the last 8760 hours. HbA1C: No results for input(s): HGBA1C in the last 72 hours. CBG: No results for input(s): GLUCAP in the last 168 hours. Lipid Profile: No results for input(s): CHOL, HDL, LDLCALC, TRIG, CHOLHDL, LDLDIRECT in the last 72 hours. Thyroid Function Tests: No results for input(s): TSH, T4TOTAL, FREET4, T3FREE, THYROIDAB in the last 72 hours. Anemia Panel: No results for input(s): VITAMINB12, FOLATE, FERRITIN, TIBC, IRON, RETICCTPCT in the last 72 hours. Urine analysis:    Component Value Date/Time    COLORURINE YELLOW 04/18/2016 1853   APPEARANCEUR CLEAR 04/18/2016 1853   LABSPEC 1.006 04/18/2016 1853   PHURINE 7.0 04/18/2016 1853   GLUCOSEU NEGATIVE 04/18/2016 1853   HGBUR SMALL (A) 04/18/2016 1853   BILIRUBINUR NEGATIVE 04/18/2016 1853   KETONESUR NEGATIVE 04/18/2016 1853   PROTEINUR NEGATIVE 04/18/2016 1853   UROBILINOGEN 0.2 12/23/2014 1251   NITRITE POSITIVE (A) 04/18/2016 1853   LEUKOCYTESUR SMALL (A) 04/18/2016 1853   Sepsis Labs: @LABRCNTIP (procalcitonin:4,lacticidven:4)  ) Recent Results (from the past 240 hour(s))  Surgical pcr screen     Status: None   Collection Time: 04/18/16  9:30 PM  Result Value Ref Range Status   MRSA, PCR NEGATIVE NEGATIVE Final   Staphylococcus aureus NEGATIVE NEGATIVE Final    Comment:        The Xpert SA Assay (FDA approved for NASAL specimens in patients over 36 years of age), is one component of a comprehensive surveillance program.  Test performance has been validated by [facility] for patients greater than or equal to 84 year old. It is not intended to diagnose infection nor to guide or monitor treatment.          Radiology Studies: Dg Chest 1 View  Result Date: 04/18/2016 CLINICAL DATA:  Unwitnessed fall EXAM: CHEST 1 VIEW COMPARISON:  05/16/2015. FINDINGS: The heart size and mediastinal contours are within normal limits. Aortic tortuosity and atherosclerosis noted. Both lungs are clear. The visualized skeletal structures are unremarkable. IMPRESSION: No active disease. Electronically Signed   By: Kerby Moors M.D.   On: 04/18/2016 16:30   Ct Head Wo Contrast  Result Date: 04/18/2016 CLINICAL DATA:  Patient with on witnessed fall.  Prior infarcts. EXAM: CT HEAD WITHOUT CONTRAST TECHNIQUE: Contiguous axial images were obtained from the base of the skull through the vertex without intravenous contrast. COMPARISON:  CT brain 12/23/2014; MRI head 12/25/2014. FINDINGS: Brain: Ventricles and sulci are prominent compatible  with atrophy. Periventricular and subcortical white matter hypodensity compatible with chronic microvascular ischemic changes. Chronic bilateral basal ganglia lacunar infarcts. No evidence for acute cortically based infarct, intracranial hemorrhage, mass lesion or mass-effect. Vascular: Internal carotid arterial vascular calcifications. Skull: Intact.  No displaced fracture. Sinuses/Orbits: Unremarkable orbits. Paranasal sinuses are well aerated. Mastoid air cells are unremarkable. Other: None. IMPRESSION: No acute intracranial process. Cortical atrophy and chronic microvascular ischemic changes. Electronically Signed   By: Lovey Newcomer M.D.   On: 04/18/2016 16:33   Dg Hip Unilat  With Pelvis 2-3 Views Right  Result Date: 04/18/2016 CLINICAL DATA:  Right leg shortening in rotation after fall at nursing facility. EXAM: DG HIP (WITH OR WITHOUT PELVIS) 2-3V RIGHT COMPARISON:  None. FINDINGS: Severely displaced proximal right femoral neck fracture is noted. Right femoral head remains situated within acetabulum. Left hip appears normal. IMPRESSION: Severely displaced proximal right femoral neck fracture. Electronically Signed   By: Sabino Dick  Brooke Bonito, M.D.   On: 04/18/2016 16:22        Scheduled Meds: . enoxaparin (LOVENOX) injection  40 mg Subcutaneous Q24H  . losartan  100 mg Oral Daily  . metoprolol succinate  50 mg Oral Daily  . polyethylene glycol  17 g Oral Daily   Continuous Infusions: . lactated ringers       LOS: 1 day    Time spent: 68min    Domenic Polite, MD [facility] Pager [phone]  If 7PM-7AM, please contact night-coverage [email or web address] Password [facility] 04/19/2016, 11:51 AM

## 2016-04-19 NOTE — Progress Notes (Signed)
Pts BP 190/86. 10 mg prn hydralazine given. Pts repeat BP 145/67. Nursing will continue to monitor.

## 2016-04-19 NOTE — NC FL2 (Signed)
[place] MEDICAID FL2 LEVEL OF CARE SCREENING TOOL     IDENTIFICATION  Patient Name: Rebecca Knight Birthdate: Oct 29, 1922 Sex: female Admission Date (Current Location): 04/18/2016  [facility] and [place] Number:  Herbalist and Address:  [facility] [place], [place] [address], [place], [place] [address]      Provider Number: [id number]  Attending Physician Name and Address:  Domenic Polite, MD  Relative Name and Phone Number:       Current Level of Care: Hospital Recommended Level of Care: [place] Prior Approval Number:    Date Approved/Denied: 04/19/16 PASRR Number: [id number]  Discharge Plan: SNF    Current Diagnoses: Patient Active Problem List   Diagnosis Date Noted  . Closed displaced fracture of right femoral neck ([place]) 04/18/2016  . Confusion   . Stroke ([place])   . Syncope and collapse   . Cerebral thrombosis with cerebral infarction ([place]) 12/25/2014  . Acute encephalopathy 12/23/2014  . Urinary incontinence 02/10/2012  . Abdominal aneurysm without mention of rupture 04/13/2010  . CHOLELITHIASIS 04/13/2010  . NONSPEC ELEVATION OF LEVELS OF TRANSAMINASE/LDH 04/13/2010  . DIVERTICULOSIS, COLON 06/14/2008  . COLON CANCER, HX OF 06/14/2008  . THYROID NODULE, RIGHT 01/26/2007  . HYPERLIPIDEMIA 01/26/2007  . OSTEOPOROSIS NOS 01/26/2007  . POSTHERPETIC NEURALGIA 11/20/2006  . Essential hypertension 11/20/2006    Orientation RESPIRATION BLADDER Height & Weight     Self, Time, Situation, Place  Normal Incontinent Weight:   Height:     BEHAVIORAL SYMPTOMS/MOOD NEUROLOGICAL BOWEL NUTRITION STATUS      Continent  (Please see discharge summary.)  AMBULATORY STATUS COMMUNICATION OF NEEDS Skin   Limited Assist Verbally Normal                       Personal Care Assistance Level of Assistance  Bathing, Feeding, Dressing Bathing Assistance: Limited assistance Feeding assistance: Independent Dressing Assistance: Limited  assistance     Functional Limitations Info  Sight, Hearing Sight Info: Adequate Hearing Info: Adequate Speech Info: Adequate    SPECIAL CARE FACTORS FREQUENCY  PT (By licensed PT), OT (By licensed OT)     PT Frequency: 5x week OT Frequency: 5x week            Contractures Contractures Info: Not present    Additional Factors Info  Code Status, Allergies Code Status Info: Full Allergies Info: Penicillins           Current Medications (04/19/2016):  This is the current hospital active medication list Current Facility-Administered Medications  Medication Dose Route Frequency Provider Last Rate Last Dose  . acetaminophen (TYLENOL) tablet 650 mg  650 mg Oral Q6H PRN Reyne Dumas, MD      . enoxaparin (LOVENOX) injection 40 mg  40 mg Subcutaneous Q24H Reyne Dumas, MD      . hydrALAZINE (APRESOLINE) injection 10 mg  10 mg Intravenous Q6H PRN Reyne Dumas, MD   10 mg at 04/19/16 0414  . HYDROcodone-acetaminophen (NORCO/VICODIN) 5-325 MG per tablet 1-2 tablet  1-2 tablet Oral Q6H PRN Reyne Dumas, MD   2 tablet at 04/18/16 1843  . lactated ringers infusion   Intravenous Continuous [name], MD      . losartan (COZAAR) tablet 100 mg  100 mg Oral Daily Reyne Dumas, MD      . metoprolol succinate (TOPROL-XL) 24 hr tablet 50 mg  50 mg Oral Daily Reyne Dumas, MD   50 mg at 04/19/16 0939  . morphine  2 MG/ML injection 2 mg  2 mg Intravenous Q4H PRN Reyne Dumas, MD   2 mg at 04/19/16 0939  . polyethylene glycol (MIRALAX / GLYCOLAX) packet 17 g  17 g Oral Daily Reyne Dumas, MD         Discharge Medications: Please see discharge summary for a list of discharge medications.  Relevant Imaging Results:  Relevant Lab Results:   Additional Information SSN: [id number]  Alla German, LCSW

## 2016-04-19 NOTE — Anesthesia Procedure Notes (Signed)
Procedure Name: Intubation Date/Time: 04/19/2016 3:55 PM Performed by: Barrington Ellison Pre-anesthesia Checklist: Patient identified, Emergency Drugs available, Suction available and Patient being monitored Patient Re-evaluated:Patient Re-evaluated prior to inductionOxygen Delivery Method: Circle System Utilized Preoxygenation: Pre-oxygenation with 100% oxygen Intubation Type: IV induction Ventilation: Mask ventilation without difficulty Laryngoscope Size: Mac and 3 Grade View: Grade II Tube type: Oral Tube size: 7.0 mm Number of attempts: 2 Airway Equipment and Method: Stylet and Oral airway Placement Confirmation: ETT inserted through vocal cords under direct vision,  positive ETCO2 and breath sounds checked- equal and bilateral Secured at: 21 cm Tube secured with: Tape Dental Injury: Teeth and Oropharynx as per pre-operative assessment

## 2016-04-19 NOTE — Anesthesia Preprocedure Evaluation (Addendum)
Anesthesia Evaluation  Patient identified by MRN, date of birth, ID band Patient awake    Reviewed: Allergy & Precautions, NPO status , Patient's Chart, lab work & pertinent test results, reviewed documented beta blocker date and time   Airway Mallampati: II  TM Distance: >3 FB Neck ROM: Full    Dental  (+) Teeth Intact, Dental Advisory Given   Pulmonary neg pulmonary ROS,    Pulmonary exam normal breath sounds clear to auscultation       Cardiovascular Exercise Tolerance: Good hypertension, Pt. on medications and Pt. on home beta blockers + Peripheral Vascular Disease (AAA s/p EVAR)  + Valvular Problems/Murmurs AS  Rhythm:Regular Rate:Normal + Systolic murmurs Echo 123456: Study Conclusions  - Left ventricle: The cavity size was normal. Wall thickness was increased in a pattern of mild LVH. Systolic function was normal. The estimated ejection fraction was in the range of 60% to 65%. Wall motion was normal; there were no regional wall motion abnormalities. Doppler parameters are consistent with abnormal left ventricular relaxation (grade 1 diastolic dysfunction). - Aortic valve: Valve mobility was restricted. There was mild stenosis. There was trivial regurgitation. Valve area (VTI): 1.38 cm^2. Valve area (Vmax): 1.36 cm^2. Valve area (Vmean): 1.28 cm^2. - Mitral valve: Calcified annulus. - Atrial septum: There was an atrial septal aneurysm.  Impressions:  - Normal LV systolic function; grade 1 diastolic dysfunction; heavily calcified aortic valve with mild AS by mean gradient (16 mmHg); trace AI; trace MR; atrial septal aneurysm.   Neuro/Psych PSYCHIATRIC DISORDERS Anxiety CVA, No Residual Symptoms    GI/Hepatic negative GI ROS, Neg liver ROS,   Endo/Other  negative endocrine ROS  Renal/GU negative Renal ROS     Musculoskeletal  (+) Arthritis , Osteoarthritis,    Abdominal   Peds  Hematology negative hematology  ROS (+) Plt 154k   Anesthesia Other Findings Day of surgery medications reviewed with the patient.  Reproductive/Obstetrics                           Anesthesia Physical Anesthesia Plan  ASA: III  Anesthesia Plan: General   Post-op Pain Management:    Induction: Intravenous  Airway Management Planned: Oral ETT  Additional Equipment:   Intra-op Plan:   Post-operative Plan: Extubation in OR  Informed Consent: I have reviewed the patients History and Physical, chart, labs and discussed the procedure including the risks, benefits and alternatives for the proposed anesthesia with the patient or authorized representative who has indicated his/her understanding and acceptance.   Dental advisory given  Plan Discussed with: CRNA  Anesthesia Plan Comments: (Risks/benefits of general anesthesia discussed with patient including risk of damage to teeth, lips, gum, and tongue, nausea/vomiting, allergic reactions to medications, and the possibility of heart attack, stroke and death.  All patient questions answered.  Patient wishes to proceed.)        Anesthesia Quick Evaluation

## 2016-04-19 NOTE — Clinical Social Work Note (Signed)
Clinical Social Work Assessment  Patient Details  Name: Rebecca Knight MRN: [id number] Date of Birth: 1922/07/09  Date of referral:  04/19/16               Reason for consult:  Facility Placement                Permission sought to share information with:  Family Supports Permission granted to share information::     Name::        Agency::     Relationship::     Contact Information:     Housing/Transportation Living arrangements for the past 2 months:  [place] Passenger transport manager) Source of Information:  Adult Children Patient Interpreter Needed:  None Criminal Activity/Legal Involvement Pertinent to Current Situation/Hospitalization:  No - Comment as needed Significant Relationships:  Adult Children Lives with:  Facility Resident Do you feel safe going back to the place where you live?  Yes Need for family participation in patient care:  No (Coment)  Care giving concerns:  Pt's son present at bedside during initial assessment.    Social Worker assessment / plan:  CSW met with pt at bedside to complete initial assessment however, pt was unable to stay awake during assessment. Pt has surgery scheduled for today (12/29). Pt's son at bedside. Pt's son reports pt was living at [facility] (ALF) prior to admission. Pt's son is agreeable to SNF if recommended at d/c. CSW explained PT would have to evaluate pt after surgery (12/29) and make the recommendation. Pt's son is unfamiliar with SNF placement. CSW explained the process. In the instance that PT recommends SNF pt's son reports CSW send referral to [place] facilities. CSW will continue to follow for SNF placement if appropriate.   Employment status:  Retired Nurse, adult PT Recommendations:  Not assessed at this time [place] / Referral to community resources:  [place]  Patient/Family's Response to care:  Pt verbalized understanding of CSW role and appreciation of support. Pt  was unable to remain awake for initial assessment. Pt's son denies any concern regarding pt care at this time.   Patient/Family's Understanding of and Emotional Response to Diagnosis, Current Treatment, and Prognosis:  Pt's son realistic and understanding regarding pt's physical limitations. Pt's son is agreeable to SNF if recommended at d/c. Pt's son denies any concern regarding treatment plan at this time.   Emotional Assessment Appearance:  Appears stated age Attitude/Demeanor/Rapport:  Unable to Assess (Patient could not remain awake to complete assessment with CSW.) Affect (typically observed):  Unable to Assess Orientation:  Oriented to Self, Oriented to Place, Oriented to  Time, Oriented to Situation Alcohol / Substance use:  Not Applicable Psych involvement (Current and /or in the community):  No (Comment)  Discharge Needs  Concerns to be addressed:  No discharge needs identified Readmission within the last 30 days:  No Current discharge risk:  Dependent with Mobility Barriers to Discharge:  Continued Medical Work up   [employer], LCSW 04/19/2016, 1:21 PM

## 2016-04-19 NOTE — Discharge Instructions (Signed)

## 2016-04-19 NOTE — OR Nursing (Signed)
Performed in PACU, Placed in Add On Room for CRNA to chart.

## 2016-04-20 ENCOUNTER — Inpatient Hospital Stay (HOSPITAL_COMMUNITY): Payer: Medicare Other | Admitting: Anesthesiology

## 2016-04-20 ENCOUNTER — Encounter (HOSPITAL_COMMUNITY): Payer: Self-pay | Admitting: Certified Registered Nurse Anesthetist

## 2016-04-20 ENCOUNTER — Encounter (HOSPITAL_COMMUNITY)
Admission: EM | Disposition: A | Discharge status: Skilled Nursing Facility | Payer: Self-pay | Source: Home / Self Care | Attending: Internal Medicine

## 2016-04-20 ENCOUNTER — Inpatient Hospital Stay (HOSPITAL_COMMUNITY): Payer: Medicare Other

## 2016-04-20 DIAGNOSIS — S72111A Displaced fracture of greater trochanter of right femur, initial encounter for closed fracture: Principal | ICD-10-CM

## 2016-04-20 HISTORY — PX: HIP ARTHROPLASTY: SHX981

## 2016-04-20 LAB — BASIC METABOLIC PANEL
ANION GAP: 12 (ref 5–15)
BUN: 18 mg/dL (ref 6–20)
CALCIUM: 9 mg/dL (ref 8.9–10.3)
CO2: 23 mmol/L (ref 22–32)
Chloride: 103 mmol/L (ref 101–111)
Creatinine, Ser: 0.94 mg/dL (ref 0.44–1.00)
GFR, EST AFRICAN AMERICAN: 60 mL/min — AB (ref >60)
GFR, EST NON AFRICAN AMERICAN: 51 mL/min — AB (ref >60)
Glucose, Bld: 115 mg/dL — ABNORMAL HIGH (ref 65–99)
Potassium: 3.6 mmol/L (ref 3.5–5.1)
SODIUM: 138 mmol/L (ref 135–145)

## 2016-04-20 LAB — CBC
HEMATOCRIT: 32.6 % — AB (ref 36.0–46.0)
Hemoglobin: 10.8 g/dL — ABNORMAL LOW (ref 12.0–15.0)
MCH: 27.8 pg (ref 26.0–34.0)
MCHC: 33.1 g/dL (ref 30.0–36.0)
MCV: 83.8 fL (ref 78.0–100.0)
Platelets: 198 10*3/uL (ref 150–400)
RBC: 3.89 MIL/uL (ref 3.87–5.11)
RDW: 14.1 % (ref 11.5–15.5)
WBC: 6.2 10*3/uL (ref 4.0–10.5)

## 2016-04-20 LAB — VITAMIN D 25 HYDROXY (VIT D DEFICIENCY, FRACTURES): VIT D 25 HYDROXY: 33.4 ng/mL (ref 30.0–100.0)

## 2016-04-20 SURGERY — HEMIARTHROPLASTY, HIP, DIRECT ANTERIOR APPROACH, FOR FRACTURE
Anesthesia: General | Site: Hip | Laterality: Right

## 2016-04-20 MED ORDER — SUCCINYLCHOLINE CHLORIDE 200 MG/10ML IV SOSY
PREFILLED_SYRINGE | INTRAVENOUS | Status: AC
  Filled 2016-04-20: qty 10

## 2016-04-20 MED ORDER — METOCLOPRAMIDE HCL 5 MG PO TABS: 5.0000 mg | ORAL_TABLET | Freq: Three times a day (TID) | ORAL | Status: DC | PRN

## 2016-04-20 MED ORDER — ACETAMINOPHEN 325 MG PO TABS
650.0000 mg | ORAL_TABLET | Freq: Four times a day (QID) | ORAL | Status: DC | PRN
  Administered 2016-04-21 – 2016-04-22 (×3): 650 mg via ORAL
  Filled 2016-04-20 (×3): qty 2

## 2016-04-20 MED ORDER — ACETAMINOPHEN 10 MG/ML IV SOLN
INTRAVENOUS | Status: AC
  Filled 2016-04-20: qty 100

## 2016-04-20 MED ORDER — ACETAMINOPHEN 10 MG/ML IV SOLN
INTRAVENOUS | Status: DC | PRN
  Administered 2016-04-20: 1000 mg via INTRAVENOUS

## 2016-04-20 MED ORDER — ONDANSETRON HCL 4 MG/2ML IJ SOLN
INTRAMUSCULAR | Status: DC | PRN
  Administered 2016-04-20: 4 mg via INTRAVENOUS

## 2016-04-20 MED ORDER — OXYCODONE HCL 5 MG PO TABS: 5.0000 mg | ORAL_TABLET | Freq: Once | ORAL | Status: DC | PRN

## 2016-04-20 MED ORDER — METOPROLOL TARTRATE 5 MG/5ML IV SOLN
INTRAVENOUS | Status: DC | PRN
  Administered 2016-04-20: 1 mg via INTRAVENOUS

## 2016-04-20 MED ORDER — PROPOFOL 10 MG/ML IV BOLUS
INTRAVENOUS | Status: DC | PRN
  Administered 2016-04-20: 100 mg via INTRAVENOUS

## 2016-04-20 MED ORDER — ROCURONIUM BROMIDE 50 MG/5ML IV SOSY
PREFILLED_SYRINGE | INTRAVENOUS | Status: AC
  Filled 2016-04-20: qty 5

## 2016-04-20 MED ORDER — ROCURONIUM BROMIDE 100 MG/10ML IV SOLN
INTRAVENOUS | Status: DC | PRN
  Administered 2016-04-20: 30 mg via INTRAVENOUS

## 2016-04-20 MED ORDER — DEXTROSE 5 % IV SOLN
INTRAVENOUS | Status: DC | PRN
  Administered 2016-04-20: 40 ug/min via INTRAVENOUS

## 2016-04-20 MED ORDER — FENTANYL CITRATE (PF) 100 MCG/2ML IJ SOLN
INTRAMUSCULAR | Status: AC
  Filled 2016-04-20: qty 2

## 2016-04-20 MED ORDER — SUGAMMADEX SODIUM 200 MG/2ML IV SOLN
INTRAVENOUS | Status: AC
  Filled 2016-04-20: qty 2

## 2016-04-20 MED ORDER — OXYCODONE HCL 5 MG/5ML PO SOLN: 5.0000 mg | Freq: Once | ORAL | Status: DC | PRN

## 2016-04-20 MED ORDER — HYDROCODONE-ACETAMINOPHEN 5-325 MG PO TABS
1.0000 | ORAL_TABLET | Freq: Four times a day (QID) | ORAL | Status: DC | PRN
  Administered 2016-04-21 – 2016-04-22 (×3): 1 via ORAL
  Filled 2016-04-20 (×3): qty 1

## 2016-04-20 MED ORDER — PHENOL 1.4 % MT LIQD: 1.0000 | OROMUCOSAL | Status: DC | PRN

## 2016-04-20 MED ORDER — METOPROLOL TARTRATE 5 MG/5ML IV SOLN
INTRAVENOUS | Status: AC
  Filled 2016-04-20: qty 5

## 2016-04-20 MED ORDER — ONDANSETRON HCL 4 MG PO TABS: 4.0000 mg | ORAL_TABLET | Freq: Four times a day (QID) | ORAL | Status: DC | PRN

## 2016-04-20 MED ORDER — CLINDAMYCIN PHOSPHATE 600 MG/50ML IV SOLN
600.0000 mg | Freq: Four times a day (QID) | INTRAVENOUS | Status: AC
  Administered 2016-04-20 – 2016-04-21 (×2): 600 mg via INTRAVENOUS
  Filled 2016-04-20 (×2): qty 50

## 2016-04-20 MED ORDER — MENTHOL 3 MG MT LOZG: 1.0000 | LOZENGE | OROMUCOSAL | Status: DC | PRN

## 2016-04-20 MED ORDER — DEXAMETHASONE SODIUM PHOSPHATE 10 MG/ML IJ SOLN
INTRAMUSCULAR | Status: AC
  Filled 2016-04-20: qty 1

## 2016-04-20 MED ORDER — LACTATED RINGERS IV SOLN
INTRAVENOUS | Status: AC
  Administered 2016-04-20: 09:00:00 via INTRAVENOUS

## 2016-04-20 MED ORDER — LIDOCAINE HCL (CARDIAC) 20 MG/ML IV SOLN
INTRAVENOUS | Status: DC | PRN
  Administered 2016-04-20: 40 mg via INTRAVENOUS

## 2016-04-20 MED ORDER — FENTANYL CITRATE (PF) 100 MCG/2ML IJ SOLN: 25.0000 ug | INTRAMUSCULAR | Status: DC | PRN

## 2016-04-20 MED ORDER — EPHEDRINE 5 MG/ML INJ
INTRAVENOUS | Status: AC
  Filled 2016-04-20: qty 10

## 2016-04-20 MED ORDER — LIDOCAINE 2% (20 MG/ML) 5 ML SYRINGE
INTRAMUSCULAR | Status: AC
  Filled 2016-04-20: qty 5

## 2016-04-20 MED ORDER — SODIUM CHLORIDE 0.9 % IR SOLN
Status: DC | PRN
  Administered 2016-04-20: 1000 mL

## 2016-04-20 MED ORDER — METOCLOPRAMIDE HCL 5 MG/ML IJ SOLN: 5.0000 mg | Freq: Three times a day (TID) | INTRAMUSCULAR | Status: DC | PRN

## 2016-04-20 MED ORDER — MORPHINE SULFATE (PF) 2 MG/ML IV SOLN: 0.5000 mg | INTRAVENOUS | Status: DC | PRN

## 2016-04-20 MED ORDER — ONDANSETRON HCL 4 MG/2ML IJ SOLN: 4.0000 mg | Freq: Four times a day (QID) | INTRAMUSCULAR | Status: DC | PRN

## 2016-04-20 MED ORDER — ASPIRIN EC 325 MG PO TBEC
325.0000 mg | DELAYED_RELEASE_TABLET | Freq: Every day | ORAL | Status: DC
  Administered 2016-04-21 – 2016-04-23 (×3): 325 mg via ORAL
  Filled 2016-04-20 (×4): qty 1

## 2016-04-20 MED ORDER — SODIUM CHLORIDE 0.9 % IR SOLN
Status: DC | PRN
  Administered 2016-04-20 (×2): 3000 mL

## 2016-04-20 MED ORDER — ACETAMINOPHEN 650 MG RE SUPP: 650.0000 mg | Freq: Four times a day (QID) | RECTAL | Status: DC | PRN

## 2016-04-20 MED ORDER — PHENYLEPHRINE 40 MCG/ML (10ML) SYRINGE FOR IV PUSH (FOR BLOOD PRESSURE SUPPORT)
PREFILLED_SYRINGE | INTRAVENOUS | Status: AC
  Filled 2016-04-20: qty 20

## 2016-04-20 MED ORDER — ONDANSETRON HCL 4 MG/2ML IJ SOLN
INTRAMUSCULAR | Status: AC
  Filled 2016-04-20: qty 2

## 2016-04-20 MED ORDER — FENTANYL CITRATE (PF) 100 MCG/2ML IJ SOLN
INTRAMUSCULAR | Status: DC | PRN
  Administered 2016-04-20 (×5): 50 ug via INTRAVENOUS

## 2016-04-20 MED ORDER — PHENYLEPHRINE HCL 10 MG/ML IJ SOLN
INTRAMUSCULAR | Status: DC | PRN
  Administered 2016-04-20 (×3): 80 ug via INTRAVENOUS
  Administered 2016-04-20: 120 ug via INTRAVENOUS

## 2016-04-20 MED ORDER — SUGAMMADEX SODIUM 200 MG/2ML IV SOLN
INTRAVENOUS | Status: DC | PRN
  Administered 2016-04-20: 100 mg via INTRAVENOUS

## 2016-04-20 SURGICAL SUPPLY — 44 items
BLADE SAW SAG 73X25 THK (BLADE) ×2
BLADE SAW SGTL 73X25 THK (BLADE) ×1 IMPLANT
BRUSH FEMORAL CANAL (MISCELLANEOUS) IMPLANT
CABLE CERLAGE W/CRIMP 1.8 (Cable) ×2 IMPLANT
CABLE CERLAGE W/CRIMP 1.8MM (Cable) ×1 IMPLANT
CAPT HIP HEMI 1 ×3 IMPLANT
COVER SURGICAL LIGHT HANDLE (MISCELLANEOUS) ×3 IMPLANT
DRAPE IMP U-DRAPE 54X76 (DRAPES) ×3 IMPLANT
DRAPE INCISE IOBAN 85X60 (DRAPES) ×6 IMPLANT
DRAPE ORTHO SPLIT 77X108 STRL (DRAPES) ×4
DRAPE SURG ORHT 6 SPLT 77X108 (DRAPES) ×2 IMPLANT
DRAPE U-SHAPE 47X51 STRL (DRAPES) ×3 IMPLANT
DRSG AQUACEL AG ADV 3.5X 6 (GAUZE/BANDAGES/DRESSINGS) ×3 IMPLANT
DRSG MEPILEX BORDER 4X8 (GAUZE/BANDAGES/DRESSINGS) ×6 IMPLANT
DURAPREP 26ML APPLICATOR (WOUND CARE) ×3 IMPLANT
ELECT BLADE 6.5 EXT (BLADE) ×3 IMPLANT
ELECT CAUTERY BLADE 6.4 (BLADE) IMPLANT
ELECT REM PT RETURN 9FT ADLT (ELECTROSURGICAL) ×3
ELECTRODE REM PT RTRN 9FT ADLT (ELECTROSURGICAL) ×1 IMPLANT
GAUZE SPONGE 4X4 12PLY STRL (GAUZE/BANDAGES/DRESSINGS) ×3 IMPLANT
GLOVE BIOGEL PI IND STRL 9 (GLOVE) ×1 IMPLANT
GLOVE BIOGEL PI INDICATOR 9 (GLOVE) ×2
GLOVE SURG ORTHO 9.0 STRL STRW (GLOVE) ×3 IMPLANT
GOWN STRL REUS W/ TWL XL LVL3 (GOWN DISPOSABLE) ×1 IMPLANT
GOWN STRL REUS W/TWL XL LVL3 (GOWN DISPOSABLE) ×2
HANDPIECE INTERPULSE COAX TIP (DISPOSABLE) ×2
KIT BASIN OR (CUSTOM PROCEDURE TRAY) ×3 IMPLANT
KIT ROOM TURNOVER OR (KITS) ×3 IMPLANT
MANIFOLD NEPTUNE II (INSTRUMENTS) ×3 IMPLANT
NS IRRIG 1000ML POUR BTL (IV SOLUTION) ×3 IMPLANT
PACK TOTAL JOINT (CUSTOM PROCEDURE TRAY) ×3 IMPLANT
PACK UNIVERSAL I (CUSTOM PROCEDURE TRAY) ×3 IMPLANT
PAD ARMBOARD 7.5X6 YLW CONV (MISCELLANEOUS) ×6 IMPLANT
SET HNDPC FAN SPRY TIP SCT (DISPOSABLE) ×1 IMPLANT
STAPLER VISISTAT 35W (STAPLE) ×3 IMPLANT
SUT ETHIBOND NAB CT1 #1 30IN (SUTURE) ×3 IMPLANT
SUT VIC AB 1 CT1 27 (SUTURE) ×2
SUT VIC AB 1 CT1 27XBRD ANBCTR (SUTURE) ×1 IMPLANT
SUT VIC AB 2-0 CTB1 (SUTURE) ×3 IMPLANT
TOWEL OR 17X24 6PK STRL BLUE (TOWEL DISPOSABLE) ×3 IMPLANT
TOWEL OR 17X26 10 PK STRL BLUE (TOWEL DISPOSABLE) ×3 IMPLANT
TOWER CARTRIDGE SMART MIX (DISPOSABLE) IMPLANT
TRAY FOLEY CATH 16FRSI W/METER (SET/KITS/TRAYS/PACK) IMPLANT
WATER STERILE IRR 1000ML POUR (IV SOLUTION) ×9 IMPLANT

## 2016-04-20 NOTE — Anesthesia Procedure Notes (Signed)
Procedure Name: Intubation Date/Time: 04/20/2016 12:54 PM Performed by: Salli Quarry Kathrine Rieves Pre-anesthesia Checklist: Patient identified, Emergency Drugs available, Suction available and Patient being monitored Patient Re-evaluated:Patient Re-evaluated prior to inductionOxygen Delivery Method: Circle System Utilized Preoxygenation: Pre-oxygenation with 100% oxygen Intubation Type: IV induction Ventilation: Mask ventilation without difficulty Laryngoscope Size: Mac and 3 Grade View: Grade III Tube type: Oral Tube size: 7.0 mm Number of attempts: 2 Airway Equipment and Method: Stylet and Oral airway Placement Confirmation: ETT inserted through vocal cords under direct vision,  positive ETCO2 and breath sounds checked- equal and bilateral Secured at: 21 cm Tube secured with: Tape Dental Injury: Teeth and Oropharynx as per pre-operative assessment  Comments: DL x1 with MAC 3 by CRNA, grade 3 view, immediate recognition of esophageal intubation, tube removed; DL x2 with MAC 3 by MD, grade 3 view, atraumatic intubation of 7.0 oral ETT, BBS=, +ETCO2, tube secured.

## 2016-04-20 NOTE — Transfer of Care (Signed)
Immediate Anesthesia Transfer of Care Note  Patient: Rebecca Knight  Procedure(s) Performed: Procedure(s): CLOSED MANIPULATION HIP (Right)  Patient Location: PACU  Anesthesia Type:General  Level of Consciousness: awake  Airway & Oxygen Therapy: Patient Spontanous Breathing  Post-op Assessment: Report given to RN and Post -op Vital signs reviewed and stable  Post vital signs: Reviewed and stable  Last Vitals:  Vitals:   04/20/16 0550 04/20/16 1012  BP: 140/74 (!) 142/76  Pulse: 94 92  Resp: 16   Temp: 37.5 C 37.2 C    Last Pain:  Vitals:   04/20/16 1012  TempSrc: Oral  PainSc:       Patients Stated Pain Goal: 3 ([id number])  Complications: No apparent anesthesia complications

## 2016-04-20 NOTE — Transfer of Care (Signed)
Immediate Anesthesia Transfer of Care Note  Patient: Rebecca Knight  Procedure(s) Performed: Procedure(s): REVISION ARTHROPLASTY BIPOLAR HIP (HEMIARTHROPLASTY) (Right)  Patient Location: PACU  Anesthesia Type:General  Level of Consciousness: awake, alert , oriented and patient cooperative  Airway & Oxygen Therapy: Patient Spontanous Breathing and Patient connected to nasal cannula oxygen  Post-op Assessment: Report given to RN and Post -op Vital signs reviewed and stable  Post vital signs: Reviewed and stable  Last Vitals:  Vitals:   04/20/16 0550 04/20/16 1012  BP: 140/74 (!) 142/76  Pulse: 94 92  Resp: 16   Temp: 37.5 C 37.2 C    Last Pain:  Vitals:   04/20/16 1012  TempSrc: Oral  PainSc:       Patients Stated Pain Goal: 3 ([id number])  Complications: No apparent anesthesia complications

## 2016-04-20 NOTE — Progress Notes (Signed)
PT Cancellation Note  Patient Details Name: Rebecca Knight MRN: [id number] DOB: [birth year]   Cancelled Treatment:    Reason Eval/Treat Not Completed: Patient not medically ready. Per MD note pt hip has dislocated and will return to OR today 04/20/16 for revision. Will evaluate once procedure is complete and pt is medically ready.    Scheryl Marten PT, DPT  [phone]  04/20/2016, 7:21 AM

## 2016-04-20 NOTE — Anesthesia Preprocedure Evaluation (Signed)
Anesthesia Evaluation  Patient identified by MRN, date of birth, ID band Patient awake    Reviewed: Allergy & Precautions, NPO status , Patient's Chart, lab work & pertinent test results, reviewed documented beta blocker date and time   Airway Mallampati: II  TM Distance: >3 FB Neck ROM: Full    Dental  (+) Teeth Intact, Dental Advisory Given   Pulmonary neg pulmonary ROS,    Pulmonary exam normal breath sounds clear to auscultation       Cardiovascular Exercise Tolerance: Good hypertension, Pt. on medications and Pt. on home beta blockers + Peripheral Vascular Disease (AAA s/p EVAR)  + Valvular Problems/Murmurs AS  Rhythm:Regular Rate:Normal + Systolic murmurs Echo 123456: Study Conclusions  - Left ventricle: The cavity size was normal. Wall thickness was increased in a pattern of mild LVH. Systolic function was normal. The estimated ejection fraction was in the range of 60% to 65%. Wall motion was normal; there were no regional wall motion abnormalities. Doppler parameters are consistent with abnormal left ventricular relaxation (grade 1 diastolic dysfunction). - Aortic valve: Valve mobility was restricted. There was mild stenosis. There was trivial regurgitation. Valve area (VTI): 1.38 cm^2. Valve area (Vmax): 1.36 cm^2. Valve area (Vmean): 1.28 cm^2. - Mitral valve: Calcified annulus. - Atrial septum: There was an atrial septal aneurysm.  Impressions:  - Normal LV systolic function; grade 1 diastolic dysfunction; heavily calcified aortic valve with mild AS by mean gradient (16 mmHg); trace AI; trace MR; atrial septal aneurysm.   Neuro/Psych PSYCHIATRIC DISORDERS Anxiety CVA, No Residual Symptoms    GI/Hepatic negative GI ROS, Neg liver ROS,   Endo/Other  negative endocrine ROS  Renal/GU negative Renal ROS     Musculoskeletal  (+) Arthritis , Osteoarthritis,    Abdominal   Peds  Hematology negative hematology  ROS (+) Plt 154k   Anesthesia Other Findings Day of surgery medications reviewed with the patient.  Reproductive/Obstetrics                             Anesthesia Physical Anesthesia Plan  ASA: III  Anesthesia Plan: General   Post-op Pain Management:    Induction: Intravenous  Airway Management Planned: Mask  Additional Equipment: None  Intra-op Plan:   Post-operative Plan:   Informed Consent: I have reviewed the patients History and Physical, chart, labs and discussed the procedure including the risks, benefits and alternatives for the proposed anesthesia with the patient or authorized representative who has indicated his/her understanding and acceptance.   Dental advisory given  Plan Discussed with: CRNA and Surgeon  Anesthesia Plan Comments:         Anesthesia Quick Evaluation

## 2016-04-20 NOTE — Op Note (Signed)
Date of Surgery: 04/20/2016  INDICATIONS: Rebecca Knight is a 82 y.o.-year-old female who is status post femoral neck fracture. She underwent hemiarthroplasty postoperatively patient hyperflexed her leg and dislocated her hip. In the recovery room patient underwent sedation with propofol and underwent closed reduction with reduction of the hip. After reduction patient had a recurrent dislocation and patient presents at this time for revision hemiarthroplasty.Marland Kitchen  PREOPERATIVE DIAGNOSIS: Dislocated right hip hemiarthroplasty  POSTOPERATIVE DIAGNOSIS: Dislocated right hip hemiarthroplasty with calcar fracture  PROCEDURE: Hemiarthroplasty femoral neck fracture Cable fixation for calcar fracture   SURGEON: Sharol Given, M.D.  ANESTHESIA:  general  IV FLUIDS AND URINE: See anesthesia.  ESTIMATED BLOOD LOSS: Minimal mL.  COMPLICATIONS: None.  COMPONENT SIZES: Depew size 4 femoral stem, +0 neck, 48 mm ball.  Zimmer cable 1  DESCRIPTION OF PROCEDURE: The patient was brought to the operating room and underwent a general anesthetic. After adequate levels of anesthesia were obtained patient was placed in the lateral decubitus position with the hip fracture side up and axillary roll was placed the arms were padded and the mark II supports were placed. The lower extremity was prepped using DuraPrep draped into a sterile field Ioban was used to cover all exposed skin. A timeout was called. A posterior lateral incision was made this was carried down through the tensor fascia lata which was split. Retractors were placed. The piriformis short external rotators and capsule were tagged and retracted with Ethibond suture. The femoral component was loose in the canal secondary to calcar fracture.. The acetabulum was sequentially reamed to 49 mm for a 48 mm head. The femoral canal was sequentially broached up to a size 4.  The calcar fracture was secured with a Zimmer cable. After securing the calcar fracture the size 4  final implant was inserted with a 48 mm head. This was reduced. With range of motion the hip was unable to be reduced with flexion to 110 full abduction and internal rotation of 70 the hip was stable. The hip was also stable with external rotation extension and abduction. The wound was irrigated with normal saline with pulsatile lavage for 6 L throughout the case. The capsule short external rotators and piriformis were reapproximated with #1 Ethibond. The tensor fascia lata was closed using #1 Vicryl. Subcutaneous was closed using 0 Vicryl the skin was closed using staples. A Mepilex dressing was applied. Patient was extubated taken to the PACU in stable condition.  Rebecca Score, MD [place] 2:20 PM

## 2016-04-20 NOTE — Anesthesia Preprocedure Evaluation (Addendum)
Anesthesia Evaluation  Patient identified by MRN, date of birth, ID band Patient awake    Reviewed: Allergy & Precautions, NPO status , Patient's Chart, lab work & pertinent test results, reviewed documented beta blocker date and time   Airway Mallampati: II  TM Distance: >3 FB Neck ROM: Full    Dental  (+) Teeth Intact, Dental Advisory Given   Pulmonary neg pulmonary ROS,    Pulmonary exam normal breath sounds clear to auscultation       Cardiovascular Exercise Tolerance: Good hypertension, Pt. on medications and Pt. on home beta blockers + Peripheral Vascular Disease (AAA s/p EVAR)  + Valvular Problems/Murmurs AS  Rhythm:Regular Rate:Normal + Systolic murmurs Echo 123456: Study Conclusions  - Left ventricle: The cavity size was normal. Wall thickness was increased in a pattern of mild LVH. Systolic function was normal. The estimated ejection fraction was in the range of 60% to 65%. Wall motion was normal; there were no regional wall motion abnormalities. Doppler parameters are consistent with abnormal left ventricular relaxation (grade 1 diastolic dysfunction). - Aortic valve: Valve mobility was restricted. There was mild stenosis. There was trivial regurgitation. Valve area (VTI): 1.38 cm^2. Valve area (Vmax): 1.36 cm^2. Valve area (Vmean): 1.28 cm^2. - Mitral valve: Calcified annulus. - Atrial septum: There was an atrial septal aneurysm.  Impressions:  - Normal LV systolic function; grade 1 diastolic dysfunction; heavily calcified aortic valve with mild AS by mean gradient (16 mmHg); trace AI; trace MR; atrial septal aneurysm.   Neuro/Psych PSYCHIATRIC DISORDERS Anxiety CVA, No Residual Symptoms    GI/Hepatic negative GI ROS, Neg liver ROS,   Endo/Other  negative endocrine ROS  Renal/GU negative Renal ROS     Musculoskeletal  (+) Arthritis , Osteoarthritis,    Abdominal   Peds  Hematology negative hematology  ROS (+) Plt 154k   Anesthesia Other Findings Day of surgery medications reviewed with the patient.  Reproductive/Obstetrics                             Anesthesia Physical Anesthesia Plan  ASA: III  Anesthesia Plan: General   Post-op Pain Management:    Induction: Intravenous  Airway Management Planned: Oral ETT  Additional Equipment: None  Intra-op Plan:   Post-operative Plan: Extubation in OR  Informed Consent: I have reviewed the patients History and Physical, chart, labs and discussed the procedure including the risks, benefits and alternatives for the proposed anesthesia with the patient or authorized representative who has indicated his/her understanding and acceptance.   Dental advisory given  Plan Discussed with: CRNA and Surgeon  Anesthesia Plan Comments:         Anesthesia Quick Evaluation

## 2016-04-20 NOTE — Anesthesia Postprocedure Evaluation (Signed)
Anesthesia Post Note  Patient: Rebecca Knight  Procedure(s) Performed: Procedure(s) (LRB): CLOSED MANIPULATION HIP (Right)  Patient location during evaluation: PACU Anesthesia Type: General Level of consciousness: awake Pain management: pain level controlled Vital Signs Assessment: post-procedure vital signs reviewed and stable Respiratory status: spontaneous breathing, nonlabored ventilation, respiratory function stable and patient connected to nasal cannula oxygen Cardiovascular status: blood pressure returned to baseline and stable Postop Assessment: no signs of nausea or vomiting Anesthetic complications: no       Last Vitals:  Vitals:   04/20/16 1430 04/20/16 1500  BP: (!) 142/68 (!) 167/78  Pulse: 85 79  Resp:  15  Temp: 36.2 C     Last Pain:  Vitals:   04/20/16 1500  TempSrc:   PainSc: Asleep                 Courtenay Hirth

## 2016-04-20 NOTE — Interval H&P Note (Signed)
History and Physical Interval Note:  04/20/2016 12:09 PM  Rebecca Knight  has presented today for surgery, with the diagnosis of dislocated right hip  The various methods of treatment have been discussed with the patient and family. After consideration of risks, benefits and other options for treatment, the patient has consented to  Procedure(s): REVISION ARTHROPLASTY BIPOLAR HIP (HEMIARTHROPLASTY) VS. TOAL HIP ARTHROPLASTY (Right) as a surgical intervention .  The patient's history has been reviewed, patient examined, no change in status, stable for surgery.  I have reviewed the patient's chart and labs.  Questions were answered to the patient's satisfaction.     [name]

## 2016-04-20 NOTE — Progress Notes (Deleted)
OT Cancellation Note  Pt not available for therapy, as Pt is in surgery. OT to follow post-op.  Hulda Humphrey OTR/L [phone]

## 2016-04-20 NOTE — Progress Notes (Signed)
Subjective: 1 Day Post-Op Procedure(s) (LRB): ARTHROPLASTY BIPOLAR HIP (HEMIARTHROPLASTY) (Right) Patient reports pain as moderate.  Follows commands appropriately.  Acute blood loss anemia from her fracture and surgery.  Vitals stable.  Apparently back to the OR today for a revision of the right hip.  Objective: Vital signs in last 24 hours: Temp:  [98.2 F (36.8 C)-99.5 F (37.5 C)] 99.5 F (37.5 C) (12/30 0550) Pulse Rate:  [62-94] 94 (12/30 0550) Resp:  [13-16] 16 (12/30 0550) BP: (111-194)/(58-94) 140/74 (12/30 0550) SpO2:  [93 %-99 %] 98 % (12/30 0550)  Intake/Output from previous day: 12/29 0701 - 12/30 0700 In: 1631.7 [I.V.:1581.7; IV Piggyback:50] Out: 150 [Blood:150] Intake/Output this shift: No intake/output data recorded.   Recent Labs  04/18/16 1546 04/20/16 0552  HGB 12.9 10.8*    Recent Labs  04/18/16 1546 04/20/16 0552  WBC 5.5 6.2  RBC 4.56 3.89  HCT 38.8 32.6*  PLT 154 198    Recent Labs  04/18/16 1546 04/20/16 0552  NA 143 138  K 3.7 3.6  CL 104 103  CO2 31 23  BUN 18 18  CREATININE 0.90 0.94  GLUCOSE 112* 115*  CALCIUM 9.3 9.0    Recent Labs  04/18/16 1546  INR 1.07    Intact pulses distally Dorsiflexion/Plantar flexion intact Incision: scant drainage Right leg shortened and rotated.   Assessment/Plan: 1 Day Post-Op Procedure(s) (LRB): ARTHROPLASTY BIPOLAR HIP (HEMIARTHROPLASTY) (Right) To the OR today for a revision of an unstable right hip hemiarthroplasty.  Mcarthur Rossetti 04/20/2016, 9:57 AM

## 2016-04-20 NOTE — Progress Notes (Signed)
OT Cancellation Note  Patient Details Name: LATISSHA SMOLINSKI MRN: [id number] DOB: Aug 02, 1922   Cancelled Treatment:    Reason Eval/Treat Not Completed: Patient at procedure or test/ unavailable. Pt in surgery, OT will continue to follow post-op. Thank you.  Merri Ray Tyreka Henneke 04/20/2016, 1:16 PM  Hulda Humphrey OTR/L [phone]

## 2016-04-20 NOTE — H&P (View-Only) (Signed)
Patient ID: Rebecca Knight, female   DOB: 1922-06-02, 82 y.o.   MRN: [id number] Patients hip has dislocated and she is scheduled for return to the operating room tomorrow for revision surgery.

## 2016-04-20 NOTE — Progress Notes (Signed)
PROGRESS NOTE    Rebecca Knight  [id number] DOB: 1922/07/09 DOA: 04/18/2016 PCP: [name], DO  Brief Narrative: 82 year old female with a history of hypertension, dyslipidemia, AAA. history of CVA 12/23/14 [acute infarct involving the left external capsule], who presents to the ER after a fall. She sustained an unwitnessed fall 12/28, Xray revealed severely communicated right femoral neck fracture.   Assessment & Plan:   Closed displaced fracture of right femoral neck (HCC) -Moderate to high risk given uncontrolled hypertension, recent CVA, history of AAA -Most recent echo 12/25/14 showed EF of 60-65% with mild aortic stenosis -h/o AAA - status post EVAR of fusiform infrarenal abdominal aortic aneurysm, last AAA USG was in 12/04/15.Dr.Abrol discussed preop risk with Dr Scot Dock . Patient is low preop risk from AAA rupture standpoint. -s/p Hemiarthroplasty right hip 12/29, then dislocated and hence plan for revision today -lovenox post op for dvt prop -will likely need SNF   HYPERLIPIDEMIA -Hold statin   Essential hypertension -BP better, Continue beta blocker, ACE inhibitor Prn hydralazine   h/o Stroke (HCC)-2016 -Resume aspirin after surgery  DVT prophylaxis: Lovenox  Full Code Family Communication:None at bedside Disposition plan: will likely need SNF   Consultants:   Ortho  Procedures:  Subjective: Feels ok, waiting for surgery  Objective: Vitals:   04/20/16 0550 04/20/16 1012 04/20/16 1430 04/20/16 1500  BP: 140/74 (!) 142/76 (!) 142/68 (!) 167/78  Pulse: 94 92 85 79  Resp: 16   15  Temp: 99.5 F (37.5 C) 98.9 F (37.2 C) 97.2 F (36.2 C)   TempSrc: Oral Oral    SpO2: 98% 98% 100% 100%    Intake/Output Summary (Last 24 hours) at 04/20/16 1519 Last data filed at 04/20/16 1500  Gross per 24 hour  Intake          2831.67 ml  Output              850 ml  Net          1981.67 ml   There were no vitals filed for this  visit.  Examination:  General exam: Appears calm and comfortable, no distress Respiratory system: Clear to auscultation. Respiratory effort normal. Cardiovascular system: S1 & S2 heard, RRR. No JVD, murmurs, rubs, gallops or clicks. No pedal edema. Gastrointestinal system: Abdomen is nondistended, soft and nontender. No organomegaly or masses felt. Normal bowel sounds heard. Central nervous system: Alert and oriented. No focal neurological deficits. Extremities: R leg shortened and ext rotated Skin: No rashes, lesions or ulcers Psychiatry: Judgement and insight appear normal. Mood & affect appropriate.     Data Reviewed: I have personally reviewed following labs and imaging studies  CBC:  Recent Labs Lab 04/18/16 1546 04/20/16 0552  WBC 5.5 6.2  NEUTROABS 3.5  --   HGB 12.9 10.8*  HCT 38.8 32.6*  MCV 85.1 83.8  PLT 154 [id number]   Basic Metabolic Panel:  Recent Labs Lab 04/18/16 1546 04/20/16 0552  NA 143 138  K 3.7 3.6  CL 104 103  CO2 31 23  GLUCOSE 112* 115*  BUN 18 18  CREATININE 0.90 0.94  CALCIUM 9.3 9.0   GFR: CrCl cannot be calculated (Unknown ideal weight.). Liver Function Tests:  Recent Labs Lab 04/18/16 1546  AST 23  ALT 14  ALKPHOS 57  BILITOT 0.8  PROT 5.7*  ALBUMIN 3.7   No results for input(s): LIPASE, AMYLASE in the last 168 hours. No results for input(s): AMMONIA in the last 168 hours. Coagulation Profile:  Recent Labs Lab 04/18/16 1546  INR 1.07   Cardiac Enzymes: No results for input(s): CKTOTAL, CKMB, CKMBINDEX, TROPONINI in the last 168 hours. BNP (last 3 results) No results for input(s): PROBNP in the last 8760 hours. HbA1C: No results for input(s): HGBA1C in the last 72 hours. CBG: No results for input(s): GLUCAP in the last 168 hours. Lipid Profile: No results for input(s): CHOL, HDL, LDLCALC, TRIG, CHOLHDL, LDLDIRECT in the last 72 hours. Thyroid Function Tests: No results for input(s): TSH, T4TOTAL, FREET4, T3FREE,  THYROIDAB in the last 72 hours. Anemia Panel: No results for input(s): VITAMINB12, FOLATE, FERRITIN, TIBC, IRON, RETICCTPCT in the last 72 hours. Urine analysis:    Component Value Date/Time   COLORURINE YELLOW 04/18/2016 1853   APPEARANCEUR CLEAR 04/18/2016 1853   LABSPEC 1.006 04/18/2016 1853   PHURINE 7.0 04/18/2016 1853   GLUCOSEU NEGATIVE 04/18/2016 1853   HGBUR SMALL (A) 04/18/2016 1853   BILIRUBINUR NEGATIVE 04/18/2016 1853   KETONESUR NEGATIVE 04/18/2016 1853   PROTEINUR NEGATIVE 04/18/2016 1853   UROBILINOGEN 0.2 12/23/2014 1251   NITRITE POSITIVE (A) 04/18/2016 1853   LEUKOCYTESUR SMALL (A) 04/18/2016 1853   Sepsis Labs: @LABRCNTIP (procalcitonin:4,lacticidven:4)  ) Recent Results (from the past 240 hour(s))  Surgical pcr screen     Status: None   Collection Time: 04/18/16  9:30 PM  Result Value Ref Range Status   MRSA, PCR NEGATIVE NEGATIVE Final   Staphylococcus aureus NEGATIVE NEGATIVE Final    Comment:        The Xpert SA Assay (FDA approved for NASAL specimens in patients over 55 years of age), is one component of a comprehensive surveillance program.  Test performance has been validated by [facility] for patients greater than or equal to 38 year old. It is not intended to diagnose infection nor to guide or monitor treatment.          Radiology Studies: Ct Head Wo Contrast  Result Date: 04/18/2016 CLINICAL DATA:  Patient with on witnessed fall.  Prior infarcts. EXAM: CT HEAD WITHOUT CONTRAST TECHNIQUE: Contiguous axial images were obtained from the base of the skull through the vertex without intravenous contrast. COMPARISON:  CT brain 12/23/2014; MRI head 12/25/2014. FINDINGS: Brain: Ventricles and sulci are prominent compatible with atrophy. Periventricular and subcortical white matter hypodensity compatible with chronic microvascular ischemic changes. Chronic bilateral basal ganglia lacunar infarcts. No evidence for acute cortically based infarct,  intracranial hemorrhage, mass lesion or mass-effect. Vascular: Internal carotid arterial vascular calcifications. Skull: Intact.  No displaced fracture. Sinuses/Orbits: Unremarkable orbits. Paranasal sinuses are well aerated. Mastoid air cells are unremarkable. Other: None. IMPRESSION: No acute intracranial process. Cortical atrophy and chronic microvascular ischemic changes. Electronically Signed   By: Lovey Newcomer M.D.   On: 04/18/2016 16:33   Pelvis Portable  Result Date: 04/19/2016 CLINICAL DATA:  Status post right hip hemiarthroplasty. EXAM: PORTABLE PELVIS 1-2 VIEWS COMPARISON:  04/18/2016 FINDINGS: Interval right hip hemiarthroplasty. Femoral component has dislocated superiorly, above the acetabulum. Bones are diffusely demineralized. Aortic endograft is incompletely visualized. IMPRESSION: Superior dislocation of the right femoral component status post right hip hemiarthroplasty. These results will be called to the ordering clinician or representative by the Radiologist Assistant, and communication documented in the PACS or zVision Dashboard. Electronically Signed   By: Misty Stanley M.D.   On: 04/19/2016 17:58   Dg Hip Port Unilat With Pelvis 1v Right  Result Date: 04/19/2016 CLINICAL DATA:  Closed dislocation of right hip EXAM: DG HIP (WITH OR WITHOUT PELVIS) 1V PORT RIGHT  COMPARISON:  None. FINDINGS: Patient has undergone right hip hemiarthroplasty. The femoral head component is superiorly dislocated relative to the acetabulum. Detail is limited with the patient's hand overlying the hip. Skin clips overlie the soft tissues. IMPRESSION: Persistent superior dislocation of the right hip. Electronically Signed   By: Nolon Nations M.D.   On: 04/19/2016 19:38   Dg Hip Unilat  With Pelvis 2-3 Views Right  Result Date: 04/18/2016 CLINICAL DATA:  Right leg shortening in rotation after fall at nursing facility. EXAM: DG HIP (WITH OR WITHOUT PELVIS) 2-3V RIGHT COMPARISON:  None. FINDINGS: Severely  displaced proximal right femoral neck fracture is noted. Right femoral head remains situated within acetabulum. Left hip appears normal. IMPRESSION: Severely displaced proximal right femoral neck fracture. Electronically Signed   By: Marijo Conception, M.D.   On: 04/18/2016 16:22        Scheduled Meds: . [MAR Hold] aspirin EC  325 mg Oral Q breakfast  . [MAR Hold] enoxaparin (LOVENOX) injection  40 mg Subcutaneous Q24H  . [MAR Hold] feeding supplement (ENSURE ENLIVE)  237 mL Oral BID BM  . [MAR Hold] losartan  100 mg Oral Daily  . [MAR Hold] metoprolol succinate  50 mg Oral Daily  . [MAR Hold] polyethylene glycol  17 g Oral Daily   Continuous Infusions: . lactated ringers 10 mL/hr at 04/20/16 1102     LOS: 2 days    Time spent: 25min    Domenic Polite, MD [facility] Pager [phone]  If 7PM-7AM, please contact night-coverage [email or web address] Password [email or web address] 04/20/2016, 3:19 PM

## 2016-04-21 LAB — BASIC METABOLIC PANEL
ANION GAP: 8 (ref 5–15)
BUN: 26 mg/dL — ABNORMAL HIGH (ref 6–20)
CALCIUM: 8.8 mg/dL — AB (ref 8.9–10.3)
CO2: 27 mmol/L (ref 22–32)
Chloride: 103 mmol/L (ref 101–111)
Creatinine, Ser: 0.97 mg/dL (ref 0.44–1.00)
GFR calc Af Amer: 57 mL/min — ABNORMAL LOW (ref >60)
GFR, EST NON AFRICAN AMERICAN: 50 mL/min — AB (ref >60)
GLUCOSE: 146 mg/dL — AB (ref 65–99)
POTASSIUM: 4.2 mmol/L (ref 3.5–5.1)
SODIUM: 138 mmol/L (ref 135–145)

## 2016-04-21 LAB — CBC
HCT: 28.6 % — ABNORMAL LOW (ref 36.0–46.0)
Hemoglobin: 9.2 g/dL — ABNORMAL LOW (ref 12.0–15.0)
MCH: 26.9 pg (ref 26.0–34.0)
MCHC: 32.2 g/dL (ref 30.0–36.0)
MCV: 83.6 fL (ref 78.0–100.0)
PLATELETS: 225 10*3/uL (ref 150–400)
RBC: 3.42 MIL/uL — AB (ref 3.87–5.11)
RDW: 14 % (ref 11.5–15.5)
WBC: 5.8 10*3/uL (ref 4.0–10.5)

## 2016-04-21 NOTE — Evaluation (Signed)
Physical Therapy Evaluation Patient Details Name: Rebecca Knight MRN: [id number] DOB: Jun 05, 1922 Today's Date: 04/21/2016   History of Present Illness  82 y/o female post op - right posterior hemiarthroplasty femoral neck fracture Cable fixation for calcar fracture. Pt has a past pertinent medical history of AAA; Anxiety; Arthritis; Cancer; Heart murmur; Herpes zoster of eye; History of colon cancer (1995); Hyperlipidemia; Hypertension; Osteoporosis; Post herpetic neuralgia; and Varicose veins. Pt has a past pertinent surgical history that includes Endovascular stent insertion (08/08/2011).  Clinical Impression   Patient is s/p above surgery resulting in functional limitations due to the deficits listed below (see PT Problem List). Currently needing 2 person assist for mobility and close guard for posterior hip prec; Patient will benefit from skilled PT to increase their independence and safety with mobility to allow discharge to the venue listed below.       Follow Up Recommendations SNF    Equipment Recommendations  Rolling walker with 5" wheels;3in1 (PT)    Recommendations for Other Services       Precautions / Restrictions Precautions Precautions: Fall;Posterior Hip Precaution Booklet Issued: Yes (comment) Precaution Comments: reviewed posterior precautions in full with Pt Required Braces or Orthoses: Knee Immobilizer - Right Knee Immobilizer - Right: Other (comment) (In room, no orders, used to help with precautions) Restrictions Weight Bearing Restrictions: Yes RLE Weight Bearing: Weight bearing as tolerated      Mobility  Bed Mobility Overal bed mobility: Needs Assistance Bed Mobility: Supine to Sit     Supine to sit: Mod assist;+2 for safety/equipment;HOB elevated     General bed mobility comments: Pt required BLE assist to EOB, and initial assist for trunk elevation. Use of bed pad to bring hips EOB.  Transfers Overall transfer level: Needs assistance Equipment  used: Rolling walker (2 wheeled) Transfers: Sit to/from [facility] Sit to Stand: Min assist;+2 safety/equipment;Mod assist Stand pivot transfers:  (attempted, but unable to perform this session)       General transfer comment: Pt min Assist for sit to stand from EOB, mod assist for safe descent into recliner one therpist supporting RLE to manage pain; attempted pivot steps to chair, but difficult steps today; moved bed and pulled recliner to pt to sit  Ambulation/Gait             General Gait Details: Unable to take steps this session; difficulty unweighing LLE for stepping due to more pain in R stance  Stairs            Wheelchair Mobility    Modified Rankin (Stroke Patients Only)       Balance Overall balance assessment: Needs assistance Sitting-balance support: Bilateral upper extremity supported;Feet supported Sitting balance-Leahy Scale: Fair Sitting balance - Comments: initially required trunk support, but able to sit EOB after approx 1 min with no assist   Standing balance support: Bilateral upper extremity supported Standing balance-Leahy Scale: Poor Standing balance comment: reliant on RW to stand                             Pertinent Vitals/Pain Pain Assessment: Faces Faces Pain Scale: Hurts even more Pain Location: R knee/hip Pain Descriptors / Indicators: Grimacing;Guarding;Moaning;Sharp;Sore Pain Intervention(s): Monitored during session;Repositioned (Notified RN of pain )    Home Living Family/patient expects to be discharged to:: Skilled nursing facility                 Additional Comments: Pt from [facility]  on [address],  in assisted living.    Prior Function Level of Independence: Independent with assistive device(s)         Comments: Pt performing her own ADL, supposed to be using a RW but Pt admits that she did not often use     Hand Dominance   Dominant Hand: Right     Extremity/Trunk Assessment   Upper Extremity Assessment Upper Extremity Assessment: Defer to OT evaluation    Lower Extremity Assessment Lower Extremity Assessment: RLE deficits/detail RLE Deficits / Details: post op deficits in strength and ROM; grossly limited by pain RLE: Unable to fully assess due to pain;Unable to fully assess due to immobilization    Cervical / Trunk Assessment Cervical / Trunk Assessment: Kyphotic  Communication   Communication: No difficulties  Cognition Arousal/Alertness: Awake/alert Behavior During Therapy: WFL for tasks assessed/performed Overall Cognitive Status: Within Functional Limits for tasks assessed                      General Comments      Exercises     Assessment/Plan    PT Assessment Patient needs continued PT services  PT Problem List Decreased strength;Decreased range of motion;Decreased activity tolerance;Decreased balance;Decreased mobility;Decreased coordination;Decreased knowledge of use of DME;Decreased knowledge of precautions;Pain          PT Treatment Interventions DME instruction;Gait training;Functional mobility training;Therapeutic activities;Therapeutic exercise;Balance training;Patient/family education    PT Goals (Current goals can be found in the Care Plan section)  Acute Rehab PT Goals Patient Stated Goal: to get stronger again PT Goal Formulation: With patient Time For Goal Achievement: 05/05/16 Potential to Achieve Goals: Good    Frequency Min 3X/week   Barriers to discharge        Co-evaluation PT/OT/SLP Co-Evaluation/Treatment: Yes Reason for Co-Treatment: For patient/therapist safety PT goals addressed during session: Mobility/safety with mobility OT goals addressed during session: ADL's and self-care       End of Session Equipment Utilized During Treatment: Right knee immobilizer;Gait belt Activity Tolerance: Patient limited by pain Patient left: in chair;with call bell/phone within  reach;with chair alarm set Nurse Communication: Mobility status;Precautions (Pain level)         Time: [id number] PT Time Calculation (min) (ACUTE ONLY): 32 min   Charges:   PT Evaluation $PT Eval Moderate Complexity: 1 Procedure     PT G CodesColletta Maryland 04/21/2016, 11:25 AM  Roney Marion, [place] Pager [phone] Office [phone]

## 2016-04-21 NOTE — Evaluation (Signed)
Occupational Therapy Evaluation Patient Details Name: Rebecca Knight MRN: [id number] DOB: [birth year] Today's Date: 04/21/2016    History of Present Illness 82 y/o female post op - right posterior hemiarthroplasty femoral neck fracture Cable fixation for calcar fracture. Pt has a past pertinent medical history of AAA; Anxiety; Arthritis; Cancer; Heart murmur; Herpes zoster of eye; History of colon cancer (1995); Hyperlipidemia; Hypertension; Osteoporosis; Post herpetic neuralgia; and Varicose veins. Pt has a past pertinent surgical history that includes Endovascular stent insertion (08/08/2011).   Clinical Impression   PTA Pt independent in ADL, using RW for mobility. Pt currently max assist for LB ADL and +2 for mobility with RW. Please see performance level below. Pt attempted stand pivot to recliner +2, but was not currently able to bear weight through RLE. Pt will benefit from skilled OT in the acute care setting to maximize independence and safety in ADL and to continue to enforce precautions during functional activities. Pt will require SNF level rehab upon d/c to facilitate return to PLOF. Next session to focus on AE education for LB bathing/dressing.     Follow Up Recommendations  SNF;Supervision/Assistance - 24 hour    Equipment Recommendations  Other (comment) (defer to next venue of care)    Recommendations for Other Services       Precautions / Restrictions Precautions Precautions: Fall;Posterior Hip Precaution Booklet Issued: Yes (comment) Precaution Comments: reviewed posterior precautions in full with Pt Required Braces or Orthoses: Knee Immobilizer - Right Knee Immobilizer - Right: Other (comment) (In room, no orders, used to help with precautions) Restrictions Weight Bearing Restrictions: Yes RLE Weight Bearing: Weight bearing as tolerated      Mobility Bed Mobility Overal bed mobility: Needs Assistance Bed Mobility: Supine to Sit     Supine to sit: Mod  assist;+2 for safety/equipment;HOB elevated     General bed mobility comments: Pt required BLE assist to EOB, and initial assist for trunk elevation. Use of bed pad to bring hips EOB.  Transfers Overall transfer level: Needs assistance Equipment used: Rolling walker (2 wheeled) Transfers: Sit to/from [facility] Sit to Stand: Min assist;+2 safety/equipment;Mod assist Stand pivot transfers:  (attempted, but unable to perform this session)       General transfer comment: Pt min Assist for sit to stand from EOB, mod assist for safe descent into recliner one therpist supporting RLE to manage pain    Balance Overall balance assessment: Needs assistance Sitting-balance support: Bilateral upper extremity supported;Feet supported Sitting balance-Leahy Scale: Fair Sitting balance - Comments: initially required trunk support, but able to sit EOB after approx 1 min with no assist   Standing balance support: Bilateral upper extremity supported Standing balance-Leahy Scale: Poor Standing balance comment: reliant on RW to stand                            ADL Overall ADL's : Needs assistance/impaired Eating/Feeding: Sitting;Minimal assistance Eating/Feeding Details (indicate cue type and reason): min assist for some containers Grooming: Brushing hair;Wash/dry face;Set up;Sitting Grooming Details (indicate cue type and reason): in recliner Upper Body Bathing: Set up;Sitting   Lower Body Bathing: Maximal assistance;Sit to/from stand   Upper Body Dressing : Sitting;Minimal assistance   Lower Body Dressing: Maximal assistance;Sit to/from stand   Toilet Transfer: Minimal assistance;Moderate assistance;+2 for safety/equipment;BSC (sit <> stand) Toilet Transfer Details (indicate cue type and reason): simulated with recliner, Pt able to stand at EOB, unable to perform stand pivot so bed was  moved, and chair brought behind her. Pt did excellent job standing with  +1 Toileting- Clothing Manipulation and Hygiene: Maximal assistance;Sit to/from stand Toileting - Clothing Manipulation Details (indicate cue type and reason): Pt dependent on therapist to perform pericare, Pt able to maintain standing with RW and assist from PT      Functional mobility during ADLs:  (sit<>stand only, Pt unable to perform stand pivot) General ADL Comments: Pt must be able to maintain posterior precautions during ADL     Vision     Perception     Praxis      Pertinent Vitals/Pain Pain Assessment: Faces Faces Pain Scale: Hurts even more Pain Location: R knee/hip Pain Descriptors / Indicators: Grimacing;Guarding;Moaning;Sharp;Sore Pain Intervention(s): Monitored during session;Repositioned;Premedicated before session     Hand Dominance Right   Extremity/Trunk Assessment Upper Extremity Assessment Upper Extremity Assessment: Generalized weakness   Lower Extremity Assessment Lower Extremity Assessment: RLE deficits/detail;Generalized weakness RLE Deficits / Details: post op deficits in strength and ROM RLE: Unable to fully assess due to pain;Unable to fully assess due to immobilization   Cervical / Trunk Assessment Cervical / Trunk Assessment: Kyphotic   Communication Communication Communication: No difficulties   Cognition Arousal/Alertness: Awake/alert Behavior During Therapy: WFL for tasks assessed/performed Overall Cognitive Status: Within Functional Limits for tasks assessed                     General Comments       Exercises       Shoulder Instructions      Home Living Family/patient expects to be discharged to:: Skilled nursing facility                                 Additional Comments: Pt from [facility] on [address],  in assisted living.      Prior Functioning/Environment Level of Independence: Independent with assistive device(s)        Comments: Pt performing her own ADL, supposed to be using  a RW but Pt admits that she did not often use        OT Problem List: Decreased strength;Decreased range of motion;Decreased activity tolerance;Impaired balance (sitting and/or standing);Decreased safety awareness;Decreased knowledge of use of DME or AE;Decreased knowledge of precautions;Pain   OT Treatment/Interventions: Self-care/ADL training;DME and/or AE instruction;Therapeutic activities;Patient/family education;Balance training    OT Goals(Current goals can be found in the care plan section) Acute Rehab OT Goals Patient Stated Goal: to get stronger again OT Goal Formulation: With patient Time For Goal Achievement: 05/05/16 Potential to Achieve Goals: Good ADL Goals Pt Will Perform Lower Body Bathing: with supervision;with adaptive equipment;sitting/lateral leans Pt Will Perform Lower Body Dressing: with supervision;with adaptive equipment;sit to/from stand Pt Will Transfer to Toilet: with supervision;ambulating;grab bars (comfort height toilet, RW) Pt Will Perform Toileting - Clothing Manipulation and hygiene: with modified independence;sit to/from stand Pt Will Perform Tub/Shower Transfer: Shower transfer;with min guard assist;rolling walker;grab bars;shower seat;ambulating  OT Frequency: Min 2X/week   Barriers to D/C:            Co-evaluation PT/OT/SLP Co-Evaluation/Treatment: Yes Reason for Co-Treatment: For patient/therapist safety;To address functional/ADL transfers PT goals addressed during session: Mobility/safety with mobility OT goals addressed during session: ADL's and self-care      End of Session Equipment Utilized During Treatment: Gait belt;Rolling walker;Right knee immobilizer Nurse Communication: Mobility status;Other (comment) (how to transfer back to bed)  Activity Tolerance: Patient tolerated treatment well Patient left:  in chair;with call bell/phone within reach;Other (comment) (eating breakfast)   Time: [id number] OT Time Calculation (min): 36  min Charges:  OT General Charges $OT Visit: 1 Procedure OT Evaluation $OT Eval Moderate Complexity: 1 Procedure G-Codes:    Merri Ray Elman Dettman 05-10-16, 10:39 AM Hulda Humphrey OTR/L [phone]

## 2016-04-21 NOTE — Progress Notes (Signed)
PROGRESS NOTE    Rebecca Knight  [id number] DOB: 06/18/1922 DOA: 04/18/2016 PCP: [name], DO  Brief Narrative: 82 year old female with a history of hypertension, dyslipidemia, AAA. history of CVA 12/23/14 [acute infarct involving the left external capsule], who presents to the ER after a fall. She sustained an unwitnessed fall 12/28, Xray revealed severely communicated right femoral neck fracture.   Assessment & Plan:   Closed displaced fracture of right femoral neck (HCC) -Moderate to high risk given uncontrolled hypertension, recent CVA, history of AAA -Most recent echo 12/25/14 showed EF of 60-65% with mild aortic stenosis -s/p Hemiarthroplasty right hip 12/29,  -then dislocated and hence s/p Hemiarthroplasty femoral neck fracture Cable fixation for calcar fracture -lovenox post op for dvt prop -will likely need SNF    h/o AAA   -status post EVAR of fusiform infrarenal abdominal aortic aneurysm, last AAA USG was in 12/04/15.Dr.Abrol discussed preop risk with Dr Scot Dock . Patient is low preop risk from AAA rupture standpoint.   HYPERLIPIDEMIA -Hold statin   Essential hypertension -BP better, Continue beta blocker, ACE inhibitor Prn hydralazine   h/o Stroke (HCC)-2016 -Resumed aspirin after surgery  DVT prophylaxis: Lovenox  Full Code Family Communication:None at bedside Disposition plan: will need SNF   Consultants:   Ortho  Procedures:  Subjective: Feels ok, waiting for surgery  Objective: Vitals:   04/20/16 1800 04/20/16 2032 04/21/16 0030 04/21/16 0400  BP: 128/77 130/63 138/71 (!) 150/70  Pulse: 95 84 90 91  Resp: 20     Temp: 98 F (36.7 C) 98.3 F (36.8 C) 97.8 F (36.6 C) 97.8 F (36.6 C)  TempSrc: Oral Axillary Oral Oral  SpO2: 98% 95% 94% 96%    Intake/Output Summary (Last 24 hours) at 04/21/16 1110 Last data filed at 04/21/16 0700  Gross per 24 hour  Intake             1490 ml  Output              900 ml  Net              590 ml     There were no vitals filed for this visit.  Examination:  General exam: Appears calm and comfortable, no distress Respiratory system: Clear to auscultation. Respiratory effort normal. Cardiovascular system: S1 & S2 heard, RRR. No JVD, murmurs,No pedal edema. Gastrointestinal system: Abdomen is nondistended, soft and nontender. Normal bowel sounds heard. Central nervous system: Alert and oriented. No focal neurological deficits. Extremities: R leg shortened and ext rotated Skin: No rashes, lesions or ulcers Psychiatry: Judgement and insight appear normal. Mood & affect appropriate.     Data Reviewed: I have personally reviewed following labs and imaging studies  CBC:  Recent Labs Lab 04/18/16 1546 04/20/16 0552 04/21/16 0504  WBC 5.5 6.2 5.8  NEUTROABS 3.5  --   --   HGB 12.9 10.8* 9.2*  HCT 38.8 32.6* 28.6*  MCV 85.1 83.8 83.6  PLT 154 198 [id number]   Basic Metabolic Panel:  Recent Labs Lab 04/18/16 1546 04/20/16 0552 04/21/16 0504  NA 143 138 138  K 3.7 3.6 4.2  CL 104 103 103  CO2 31 23 27   GLUCOSE 112* 115* 146*  BUN 18 18 26*  CREATININE 0.90 0.94 0.97  CALCIUM 9.3 9.0 8.8*   GFR: CrCl cannot be calculated (Unknown ideal weight.). Liver Function Tests:  Recent Labs Lab 04/18/16 1546  AST 23  ALT 14  ALKPHOS 57  BILITOT 0.8  PROT 5.7*  ALBUMIN 3.7   No results for input(s): LIPASE, AMYLASE in the last 168 hours. No results for input(s): AMMONIA in the last 168 hours. Coagulation Profile:  Recent Labs Lab 04/18/16 1546  INR 1.07   Cardiac Enzymes: No results for input(s): CKTOTAL, CKMB, CKMBINDEX, TROPONINI in the last 168 hours. BNP (last 3 results) No results for input(s): PROBNP in the last 8760 hours. HbA1C: No results for input(s): HGBA1C in the last 72 hours. CBG: No results for input(s): GLUCAP in the last 168 hours. Lipid Profile: No results for input(s): CHOL, HDL, LDLCALC, TRIG, CHOLHDL, LDLDIRECT in the last 72 hours. Thyroid  Function Tests: No results for input(s): TSH, T4TOTAL, FREET4, T3FREE, THYROIDAB in the last 72 hours. Anemia Panel: No results for input(s): VITAMINB12, FOLATE, FERRITIN, TIBC, IRON, RETICCTPCT in the last 72 hours. Urine analysis:    Component Value Date/Time   COLORURINE YELLOW 04/18/2016 1853   APPEARANCEUR CLEAR 04/18/2016 1853   LABSPEC 1.006 04/18/2016 1853   PHURINE 7.0 04/18/2016 1853   GLUCOSEU NEGATIVE 04/18/2016 1853   HGBUR SMALL (A) 04/18/2016 1853   BILIRUBINUR NEGATIVE 04/18/2016 1853   KETONESUR NEGATIVE 04/18/2016 1853   PROTEINUR NEGATIVE 04/18/2016 1853   UROBILINOGEN 0.2 12/23/2014 1251   NITRITE POSITIVE (A) 04/18/2016 1853   LEUKOCYTESUR SMALL (A) 04/18/2016 1853   Sepsis Labs: @LABRCNTIP (procalcitonin:4,lacticidven:4)  ) Recent Results (from the past 240 hour(s))  Surgical pcr screen     Status: None   Collection Time: 04/18/16  9:30 PM  Result Value Ref Range Status   MRSA, PCR NEGATIVE NEGATIVE Final   Staphylococcus aureus NEGATIVE NEGATIVE Final    Comment:        The Xpert SA Assay (FDA approved for NASAL specimens in patients over 34 years of age), is one component of a comprehensive surveillance program.  Test performance has been validated by [facility] for patients greater than or equal to 71 year old. It is not intended to diagnose infection nor to guide or monitor treatment.          Radiology Studies: Pelvis Portable  Result Date: 04/20/2016 CLINICAL DATA:  Right hip hemiarthroplasty. EXAM: PORTABLE PELVIS 1-2 VIEWS COMPARISON:  Radiograph of April 19, 2016. FINDINGS: Right hip dislocation noted on prior exam has been successfully reduced. Right hip prosthesis appears to be well situated. Expected postoperative changes are seen involving the surrounding soft tissues. IMPRESSION: Successful reduction of previously described dislocation of right hip prosthesis. Electronically Signed   By: Marijo Conception, M.D.   On: 04/20/2016  19:45   Pelvis Portable  Result Date: 04/19/2016 CLINICAL DATA:  Status post right hip hemiarthroplasty. EXAM: PORTABLE PELVIS 1-2 VIEWS COMPARISON:  04/18/2016 FINDINGS: Interval right hip hemiarthroplasty. Femoral component has dislocated superiorly, above the acetabulum. Bones are diffusely demineralized. Aortic endograft is incompletely visualized. IMPRESSION: Superior dislocation of the right femoral component status post right hip hemiarthroplasty. These results will be called to the ordering clinician or representative by the Radiologist Assistant, and communication documented in the PACS or zVision Dashboard. Electronically Signed   By: Misty Stanley M.D.   On: 04/19/2016 17:58   Dg Hip Port Unilat With Pelvis 1v Right  Result Date: 04/19/2016 CLINICAL DATA:  Closed dislocation of right hip EXAM: DG HIP (WITH OR WITHOUT PELVIS) 1V PORT RIGHT COMPARISON:  None. FINDINGS: Patient has undergone right hip hemiarthroplasty. The femoral head component is superiorly dislocated relative to the acetabulum. Detail is limited with the patient's hand overlying the hip. Skin clips overlie the soft  tissues. IMPRESSION: Persistent superior dislocation of the right hip. Electronically Signed   By: Nolon Nations M.D.   On: 04/19/2016 19:38        Scheduled Meds: . aspirin EC  325 mg Oral Q breakfast  . enoxaparin (LOVENOX) injection  40 mg Subcutaneous Q24H  . feeding supplement (ENSURE ENLIVE)  237 mL Oral BID BM  . losartan  100 mg Oral Daily  . metoprolol succinate  50 mg Oral Daily  . polyethylene glycol  17 g Oral Daily   Continuous Infusions: . lactated ringers 10 mL/hr at 04/20/16 2200     LOS: 3 days    Time spent: 25min    Domenic Polite, MD [facility] Pager [phone]  If 7PM-7AM, please contact night-coverage [email or web address] Password [email or web address] 04/21/2016, 11:10 AM

## 2016-04-21 NOTE — Clinical Social Work Note (Addendum)
CSW attempted to speak with patient in regards to SNF bed offers however she was sleeping and did not want to wake up.  CSW attempted to call patient's son Truman Hayward, and CSW left message awaiting call back from patient's son.  CSW to continue to follow patient's progress.    CSW received phone call from patient's son, Truman Hayward, he is thinking about having patient go to [facility], he will take a look at facility and confirm what his choice is and call social worker back with decision.  CSW updated [facility], that patient may be interested.  Patient will need [facility] Authorization for patient to go to SNF.  Jones Broom. Thaddeus Evitts, MSW, [place] Weekend phone [phone] 04/21/2016 12:07 PM

## 2016-04-21 NOTE — Progress Notes (Signed)
Subjective: 1 Day Post-Op Procedure(s) (LRB): REVISION ARTHROPLASTY BIPOLAR HIP (HEMIARTHROPLASTY) (Right) Patient reports pain as moderate.  Tolerated revision surgery yesterday.  Acute blood loss anemia, but stable.  Objective: Vital signs in last 24 hours: Temp:  [97.2 F (36.2 C)-98.9 F (37.2 C)] 97.8 F (36.6 C) (12/31 0400) Pulse Rate:  [79-95] 91 (12/31 0400) Resp:  [15-20] 20 (12/30 1800) BP: (128-167)/(63-78) 150/70 (12/31 0400) SpO2:  [93 %-100 %] 96 % (12/31 0400)  Intake/Output from previous day: 12/30 0701 - 12/31 0700 In: 1490 [P.O.:290; I.V.:1200] Out: 900 [Urine:500; Blood:400] Intake/Output this shift: No intake/output data recorded.   Recent Labs  04/18/16 1546 04/20/16 0552 04/21/16 0504  HGB 12.9 10.8* 9.2*    Recent Labs  04/20/16 0552 04/21/16 0504  WBC 6.2 5.8  RBC 3.89 3.42*  HCT 32.6* 28.6*  PLT 198 225    Recent Labs  04/20/16 0552 04/21/16 0504  NA 138 138  K 3.6 4.2  CL 103 103  CO2 23 27  BUN 18 26*  CREATININE 0.94 0.97  GLUCOSE 115* 146*  CALCIUM 9.0 8.8*    Recent Labs  04/18/16 1546  INR 1.07    Sensation intact distally Intact pulses distally Dorsiflexion/Plantar flexion intact Incision: dressing C/D/I  Assessment/Plan: 1 Day Post-Op Procedure(s) (LRB): REVISION ARTHROPLASTY BIPOLAR HIP (HEMIARTHROPLASTY) (Right) Up with therapy  WBAT right hip; posterior hip precautions. PT/OT, possible SNF placement.  Mcarthur Rossetti 04/21/2016, 9:07 AM

## 2016-04-22 ENCOUNTER — Encounter (HOSPITAL_COMMUNITY): Payer: Self-pay | Admitting: Orthopedic Surgery

## 2016-04-22 LAB — CBC
HCT: 24.9 % — ABNORMAL LOW (ref 36.0–46.0)
Hemoglobin: 8.3 g/dL — ABNORMAL LOW (ref 12.0–15.0)
MCH: 27.9 pg (ref 26.0–34.0)
MCHC: 33.3 g/dL (ref 30.0–36.0)
MCV: 83.8 fL (ref 78.0–100.0)
PLATELETS: 272 10*3/uL (ref 150–400)
RBC: 2.97 MIL/uL — ABNORMAL LOW (ref 3.87–5.11)
RDW: 14.2 % (ref 11.5–15.5)
WBC: 7.6 10*3/uL (ref 4.0–10.5)

## 2016-04-22 LAB — BASIC METABOLIC PANEL
Anion gap: 7 (ref 5–15)
BUN: 32 mg/dL — AB (ref 6–20)
CALCIUM: 9 mg/dL (ref 8.9–10.3)
CO2: 28 mmol/L (ref 22–32)
CREATININE: 1.01 mg/dL — AB (ref 0.44–1.00)
Chloride: 103 mmol/L (ref 101–111)
GFR, EST AFRICAN AMERICAN: 55 mL/min — AB (ref >60)
GFR, EST NON AFRICAN AMERICAN: 47 mL/min — AB (ref >60)
Glucose, Bld: 103 mg/dL — ABNORMAL HIGH (ref 65–99)
Potassium: 3.9 mmol/L (ref 3.5–5.1)
SODIUM: 138 mmol/L (ref 135–145)

## 2016-04-22 NOTE — Progress Notes (Signed)
PROGRESS NOTE    Rebecca Knight  [id number] DOB: [birth year] DOA: 04/18/2016 PCP: [name], DO  Brief Narrative: 83 year old female with a history of hypertension, dyslipidemia, AAA. history of CVA 12/23/14 [acute infarct involving the left external capsule], who presents to the ER after a fall. She sustained an unwitnessed fall 12/28, Xray revealed severely communicated right femoral neck fracture.   Assessment & Plan:   Closed displaced fracture of right femoral neck (HCC) -Moderate to high risk given uncontrolled hypertension, recent CVA, history of AAA -Most recent echo 12/25/14 showed EF of 60-65% with mild aortic stenosis -s/p Hemiarthroplasty right hip 12/29,     -then dislocated and hence s/p Hemiarthroplasty femoral neck fracture Cable fixation for calcar fracture -lovenox post op for dvt prop -needs SNF, awaiting insurance authorization , medically stable for DC    h/o AAA   -status post EVAR of fusiform infrarenal abdominal aortic aneurysm, last AAA USG was in 12/04/15.Dr.Abrol discussed preop risk with Dr Scot Dock . Patient is low preop risk from AAA rupture standpoint.   HYPERLIPIDEMIA -Hold statin   Essential hypertension -BP better, Continue beta blocker, ACE inhibitor Prn hydralazine   h/o Stroke (HCC)-2016 -Resumed aspirin after surgery  DVT prophylaxis: Lovenox  Full Code Family Communication:None at bedside Disposition plan: will need SNF   Consultants:   Ortho  Procedures: 12/29: PROCEDURE: Hemiarthroplasty right hip 12/30: PROCEDURE: Hemiarthroplasty femoral neck fracture Cable fixation for calcar fracture Subjective: Doing well, no issues, some pain with activity  Objective: Vitals:   04/22/16 0200 04/22/16 0350 04/22/16 0609 04/22/16 0800  BP:   131/66 (!) 129/58  Pulse:   83 87  Resp:      Temp:   97.7 F (36.5 C)   TempSrc:   Oral   SpO2: 93% 95% 95% 95%    Intake/Output Summary (Last 24 hours) at 04/22/16 1139 Last data  filed at 04/22/16 0800  Gross per 24 hour  Intake          1399.67 ml  Output                0 ml  Net          1399.67 ml   There were no vitals filed for this visit.  Examination:  General exam: Appears calm and comfortable, no distress Respiratory system: Clear to auscultation. Respiratory effort normal. Cardiovascular system: S1 & S2 heard, RRR. No JVD, murmurs,No pedal edema. Gastrointestinal system: Abdomen is nondistended, soft and nontender. Normal bowel sounds heard. Central nervous system: Alert and oriented. No focal neurological deficits. Extremities: R leg shortened and ext rotated Skin: No rashes, lesions or ulcers Psychiatry: Judgement and insight appear normal. Mood & affect appropriate.     Data Reviewed: I have personally reviewed following labs and imaging studies  CBC:  Recent Labs Lab 04/18/16 1546 04/20/16 0552 04/21/16 0504 04/22/16 0530  WBC 5.5 6.2 5.8 7.6  NEUTROABS 3.5  --   --   --   HGB 12.9 10.8* 9.2* 8.3*  HCT 38.8 32.6* 28.6* 24.9*  MCV 85.1 83.8 83.6 83.8  PLT 154 198 225 [id number]   Basic Metabolic Panel:  Recent Labs Lab 04/18/16 1546 04/20/16 0552 04/21/16 0504 04/22/16 0530  NA 143 138 138 138  K 3.7 3.6 4.2 3.9  CL 104 103 103 103  CO2 31 23 27 28   GLUCOSE 112* 115* 146* 103*  BUN 18 18 26* 32*  CREATININE 0.90 0.94 0.97 1.01*  CALCIUM 9.3 9.0 8.8* 9.0  GFR: CrCl cannot be calculated (Unknown ideal weight.). Liver Function Tests:  Recent Labs Lab 04/18/16 1546  AST 23  ALT 14  ALKPHOS 57  BILITOT 0.8  PROT 5.7*  ALBUMIN 3.7   No results for input(s): LIPASE, AMYLASE in the last 168 hours. No results for input(s): AMMONIA in the last 168 hours. Coagulation Profile:  Recent Labs Lab 04/18/16 1546  INR 1.07   Cardiac Enzymes: No results for input(s): CKTOTAL, CKMB, CKMBINDEX, TROPONINI in the last 168 hours. BNP (last 3 results) No results for input(s): PROBNP in the last 8760 hours. HbA1C: No results for  input(s): HGBA1C in the last 72 hours. CBG: No results for input(s): GLUCAP in the last 168 hours. Lipid Profile: No results for input(s): CHOL, HDL, LDLCALC, TRIG, CHOLHDL, LDLDIRECT in the last 72 hours. Thyroid Function Tests: No results for input(s): TSH, T4TOTAL, FREET4, T3FREE, THYROIDAB in the last 72 hours. Anemia Panel: No results for input(s): VITAMINB12, FOLATE, FERRITIN, TIBC, IRON, RETICCTPCT in the last 72 hours. Urine analysis:    Component Value Date/Time   COLORURINE YELLOW 04/18/2016 1853   APPEARANCEUR CLEAR 04/18/2016 1853   LABSPEC 1.006 04/18/2016 1853   PHURINE 7.0 04/18/2016 1853   GLUCOSEU NEGATIVE 04/18/2016 1853   HGBUR SMALL (A) 04/18/2016 1853   BILIRUBINUR NEGATIVE 04/18/2016 1853   KETONESUR NEGATIVE 04/18/2016 1853   PROTEINUR NEGATIVE 04/18/2016 1853   UROBILINOGEN 0.2 12/23/2014 1251   NITRITE POSITIVE (A) 04/18/2016 1853   LEUKOCYTESUR SMALL (A) 04/18/2016 1853   Sepsis Labs: @LABRCNTIP (procalcitonin:4,lacticidven:4)  ) Recent Results (from the past 240 hour(s))  Surgical pcr screen     Status: None   Collection Time: 04/18/16  9:30 PM  Result Value Ref Range Status   MRSA, PCR NEGATIVE NEGATIVE Final   Staphylococcus aureus NEGATIVE NEGATIVE Final    Comment:        The Xpert SA Assay (FDA approved for NASAL specimens in patients over 36 years of age), is one component of a comprehensive surveillance program.  Test performance has been validated by [facility] for patients greater than or equal to 30 year old. It is not intended to diagnose infection nor to guide or monitor treatment.          Radiology Studies: Pelvis Portable  Result Date: 04/20/2016 CLINICAL DATA:  Right hip hemiarthroplasty. EXAM: PORTABLE PELVIS 1-2 VIEWS COMPARISON:  Radiograph of April 19, 2016. FINDINGS: Right hip dislocation noted on prior exam has been successfully reduced. Right hip prosthesis appears to be well situated. Expected  postoperative changes are seen involving the surrounding soft tissues. IMPRESSION: Successful reduction of previously described dislocation of right hip prosthesis. Electronically Signed   By: Marijo Conception, M.D.   On: 04/20/2016 19:45        Scheduled Meds: . aspirin EC  325 mg Oral Q breakfast  . enoxaparin (LOVENOX) injection  40 mg Subcutaneous Q24H  . feeding supplement (ENSURE ENLIVE)  237 mL Oral BID BM  . losartan  100 mg Oral Daily  . metoprolol succinate  50 mg Oral Daily  . polyethylene glycol  17 g Oral Daily   Continuous Infusions: . lactated ringers Stopped (04/22/16 0800)     LOS: 4 days    Time spent: 29min    Domenic Polite, MD [facility] Pager [phone]  If 7PM-7AM, please contact night-coverage [email or web address] Password [email or web address] 04/22/2016, 11:39 AM

## 2016-04-22 NOTE — Anesthesia Postprocedure Evaluation (Signed)
Anesthesia Post Note  Patient: HOLLIANNE SANCHEZ  Procedure(s) Performed: Procedure(s) (LRB): REVISION ARTHROPLASTY BIPOLAR HIP (HEMIARTHROPLASTY) (Right)  Patient location during evaluation: PACU Anesthesia Type: General Level of consciousness: awake and alert Pain management: pain level controlled Vital Signs Assessment: post-procedure vital signs reviewed and stable Respiratory status: spontaneous breathing, nonlabored ventilation, respiratory function stable and patient connected to nasal cannula oxygen Cardiovascular status: blood pressure returned to baseline and stable Postop Assessment: no signs of nausea or vomiting Anesthetic complications: no       Last Vitals:  Vitals:   04/22/16 0609 04/22/16 0800  BP: 131/66 (!) 129/58  Pulse: 83 87  Resp:    Temp: 36.5 C     Last Pain:  Vitals:   04/22/16 1000  TempSrc:   PainSc: 0-No pain                 Aydian Dimmick

## 2016-04-22 NOTE — Clinical Social Work Note (Addendum)
CSW called [facility] [phone] for auth @ [facility], [place] states closed today.  CSW faxed auth to [facility] [phone]. CSW will continue to follow.  CSW received call from pt's son, who reports he no longer wants pt to go to [facility], visited facility, very disappointed, wants pt to go to [facility]. CSW contacted Abigail Butts @ [facility],pt can come today with 5 day LOG. Roque Cash staffed with Asst. Dir.   CSW updated son with bed offer acceptance @ [facility], son now has concerns about pt transferring today, feels she not medically stable, son referred  to MD to discuss medical clearance.   MD updated CSW, pt will DC tomorrow. CSW advised facility and provided son with contact information for the facility to sign paperwork. Pt has no other needs at this time. CSW will remain available for pt/family for any needs/support as indicated.   CSW re-faxed Josem Kaufmann to [facility] [phone] with new facility information.  Harumi Yamin B. Joline Maxcy Clinical Social Work Dept Weekend Social Worker [phone] 1:27 PM

## 2016-04-23 ENCOUNTER — Encounter (HOSPITAL_COMMUNITY): Payer: Self-pay | Admitting: Orthopedic Surgery

## 2016-04-23 LAB — CBC
HCT: 25.3 % — ABNORMAL LOW (ref 36.0–46.0)
HEMOGLOBIN: 8.2 g/dL — AB (ref 12.0–15.0)
MCH: 27.4 pg (ref 26.0–34.0)
MCHC: 32.4 g/dL (ref 30.0–36.0)
MCV: 84.6 fL (ref 78.0–100.0)
Platelets: 265 10*3/uL (ref 150–400)
RBC: 2.99 MIL/uL — ABNORMAL LOW (ref 3.87–5.11)
RDW: 14 % (ref 11.5–15.5)
WBC: 5.4 10*3/uL (ref 4.0–10.5)

## 2016-04-23 MED ORDER — SENNOSIDES 8.6 MG PO TABS: 1.0000 | ORAL_TABLET | Freq: Two times a day (BID) | ORAL | Status: AC | PRN

## 2016-04-23 MED ORDER — ENOXAPARIN SODIUM 40 MG/0.4ML ~~LOC~~ SOLN: 40.0000 mg | SUBCUTANEOUS | Status: DC

## 2016-04-23 MED ORDER — HYDROCODONE-ACETAMINOPHEN 5-325 MG PO TABS: 1.0000 | ORAL_TABLET | Freq: Four times a day (QID) | ORAL | 0 refills | Status: DC | PRN

## 2016-04-23 MED ORDER — ACETAMINOPHEN 325 MG PO TABS: 650.0000 mg | ORAL_TABLET | Freq: Four times a day (QID) | ORAL | Status: AC | PRN

## 2016-04-23 NOTE — Progress Notes (Signed)
Report called to Rudi Heap, LPN at [facility]. Questions answered and denies further questions.

## 2016-04-23 NOTE — Clinical Social Work Note (Signed)
At this time the facility is prepared for the pt. Pt's son is going to the facility at 1 to complete paperwork. After this is complete the facility will provide CSW with an approval for transportation to be called. RN to call [phone] for report prior to d/c. Pt will be going to room 304.  [address], [place]

## 2016-04-23 NOTE — Clinical Social Work Note (Signed)
CSW has received insurance auth. at this time. #[id number] RUG level: RVC CSW shared this information with the facility.  [address] [place]

## 2016-04-23 NOTE — Progress Notes (Signed)
Report given to [facility] with [facility] #[id number].

## 2016-04-23 NOTE — Progress Notes (Signed)
Patient ID: Rebecca Knight, female   DOB: 03-11-1923, 83 y.o.   MRN: [id number] Patient resting comfortably. Hemoglobin stable. Plan for discharge to skilled nursing facility.

## 2016-04-23 NOTE — Anesthesia Postprocedure Evaluation (Signed)
Anesthesia Post Note  Patient: Rebecca Knight  Procedure(s) Performed: Procedure(s) (LRB): ARTHROPLASTY BIPOLAR HIP (HEMIARTHROPLASTY) (Right)  Patient location during evaluation: PACU Anesthesia Type: General Level of consciousness: awake and alert and patient cooperative Pain management: pain level controlled Vital Signs Assessment: post-procedure vital signs reviewed and stable Respiratory status: spontaneous breathing and respiratory function stable Cardiovascular status: stable Anesthetic complications: no       Last Vitals:  Vitals:   04/22/16 1956 04/23/16 0502  BP: (!) 143/56 (!) 169/72  Pulse: 79 81  Resp: 18 18  Temp: 37.1 C 37.3 C    Last Pain:  Vitals:   04/23/16 0502  TempSrc: Oral  PainSc:                  [place]

## 2016-04-23 NOTE — Clinical Social Work Note (Signed)
Clinical Social Worker facilitated patient discharge including contacting patient family and facility to confirm patient discharge plans.  Clinical information faxed to facility and family agreeable with plan.  CSW arranged ambulance transport via [facility] to [facility] .  RN to call [phone] for report prior to d/c. Pt will be going to room 304.  Clinical Social Worker will sign off for now as social work intervention is no longer needed. Please consult [place] again if new need arises.  [address], [place]

## 2016-04-23 NOTE — Clinical Social Work Placement (Signed)
   CLINICAL SOCIAL WORK PLACEMENT  NOTE  Date:  04/23/2016  Patient Details  Name: Rebecca Knight MRN: [id number] Date of Birth: [birth year]  Clinical Social Work is seeking post-discharge placement for this patient at the [place] level of care (*CSW will initial, date and re-position this form in  chart as items are completed):      Patient/family provided with [place] Work Department's list of facilities offering this level of care within the geographic area requested by the patient (or if unable, by the patient's family).  Yes   Patient/family informed of their freedom to choose among providers that offer the needed level of care, that participate in Medicare, Medicaid or managed care program needed by the patient, have an available bed and are willing to accept the patient.      Patient/family informed of [facility]'s ownership interest in [facility] and [facility], as well as of the fact that they are under no obligation to receive care at these facilities.  PASRR submitted to EDS on       PASRR number received on 05/20/16     Existing PASRR number confirmed on       FL2 transmitted to all facilities in geographic area requested by pt/family on 05/20/16     FL2 transmitted to all facilities within larger geographic area on       Patient informed that his/her managed care company has contracts with or will negotiate with certain facilities, including the following:        Yes   Patient/family informed of bed offers received.  Patient chooses bed at [facility]     Physician recommends and patient chooses bed at      Patient to be transferred to [facility] on 04/23/16.  Patient to be transferred to facility by [facility]     Patient family notified on 04/23/16 of transfer.  Name of family member notified:  Truman Hayward     PHYSICIAN Please prepare prescriptions     Additional Comment:     _______________________________________________ Alla German, LCSW 04/23/2016, 8:59 AM

## 2016-04-23 NOTE — Discharge Summary (Signed)
Physician Discharge Summary  Rebecca Knight [id number] DOB: 12-21-1922 DOA: 04/18/2016  PCP: [name], DO  Admit date: 04/18/2016 Discharge date: 04/23/2016  Time spent: 45 minutes  Recommendations for Outpatient Follow-up:  1. PCP in 1 week 2. Ortho Dr.Duda in 1-2weeks 3. Started Lovenox SQ daily for DVT prophylaxis post hip surgery, stop this after 3-4weeks   Discharge Diagnoses:  Principal Problem:   Closed displaced fracture of right femoral neck ([place]) Active Problems:   HYPERLIPIDEMIA   Essential hypertension   H/o Stroke [facility])   Displaced fracture of greater trochanter of right femur, initial encounter for closed fracture [facility]   Discharge Condition: stable  Diet recommendation: heart healthy  There were no vitals filed for this visit.  History of present illness:  83 year old female with a history of hypertension, dyslipidemia, AAA. history of CVA 12/23/14 [acute infarct involving the left external capsule], who presents to the ER after a fall. She sustained an unwitnessed fall 12/28, Xray revealed severely communicated right femoral neck fracture  Hospital Course:  Closed displaced fracture of right femoral neck ([name]) -Moderate peri op risk given uncontrolled hypertension, recent CVA, history of AAA -s/p Hemiarthroplasty right hip 12/29,   -then dislocated and hence s/p Hemiarthroplasty femoral neck fracture Cable fixation for calcar fracture on 12/30 -she did well post op without complications -started lovenox post op for dvt prophylaxis, needs this for 3-4weeks -PT eval completed -Discharge to SNF today for rehab    h/o AAA   -status post EVAR of fusiform infrarenal abdominal aortic aneurysm, last AAA USG was in 12/04/15.   -FU with dr.Dickson  HYPERLIPIDEMIA -resumed statin  Essential hypertension -BP better, Continue beta blocker, ACE inhibitor  h/o Stroke (HCC)-2016 -Resumed aspirin after surgery  Procedures:    Consultations:  Ortho  Dr.Duda  Discharge Exam: Vitals:   04/22/16 1956 04/23/16 0502  BP: (!) 143/56 (!) 169/72  Pulse: 79 81  Resp: 18 18  Temp: 98.8 F (37.1 C) 99.2 F (37.3 C)    General: AAox3 Cardiovascular: S1S2/RRR Respiratory: CTAB  Discharge Instructions   Discharge Instructions    Diet - low sodium heart healthy    Complete by:  As directed    Increase activity slowly    Complete by:  As directed    Weight bearing as tolerated    Complete by:  As directed    Laterality:  right   Extremity:  Lower     Current Discharge Medication List    START taking these medications   Details  acetaminophen (TYLENOL) 325 MG tablet Take 2 tablets (650 mg total) by mouth every 6 (six) hours as needed for mild pain (or Fever >/= 101).    enoxaparin (LOVENOX) 40 MG/0.4ML injection Inject 0.4 mLs (40 mg total) into the skin daily. For 4weeks Qty: 0 Syringe    HYDROcodone-acetaminophen (NORCO/VICODIN) 5-325 MG tablet Take 1-2 tablets by mouth every 6 (six) hours as needed for moderate pain. Qty: 20 tablet, Refills: 0    senna (SENOKOT) 8.6 MG tablet Take 1 tablet (8.6 mg total) by mouth 2 (two) times daily as needed for constipation.      CONTINUE these medications which have NOT CHANGED   Details  aspirin EC 81 MG tablet Take 81 mg by mouth daily.    gabapentin (NEURONTIN) 100 MG capsule Take 100-300 mg by mouth 3 (three) times daily. Take 100mg  by mouth at 0800 and 1400 and 300mg  by mouth at 2000    losartan (COZAAR) 100 MG tablet Take  1 tablet by mouth daily    metoprolol succinate (TOPROL-XL) 50 MG 24 hr tablet Take 50 mg by mouth daily.  Refills: 10    Polyethyl Glycol-Propyl Glycol (SYSTANE) 0.4-0.3 % SOLN Apply 1 drop to eye 4 (four) times daily. Reported on 05/13/2015    pravastatin (PRAVACHOL) 20 MG tablet 1 BY MOUTH DAILY Qty: 90 tablet, Refills: 1       Allergies  Allergen Reactions  . Penicillins Rash    Has patient had a PCN reaction causing immediate rash,  facial/tongue/throat swelling, SOB or lightheadedness with hypotension: Yes Has patient had a PCN reaction causing severe rash involving mucus membranes or skin necrosis: Yes Has patient had a PCN reaction that required hospitalization No Has patient had a PCN reaction occurring within the last 10 years: No If all of the above answers are "NO", then may proceed with Cephalosporin use.     Follow-up Information    [name], MD Follow up in 2 week(s).   Specialty:  Orthopedic Surgery Contact information: [place] [address] [phone]        [name], DO. Schedule an appointment as soon as possible for a visit in 1 week(s).   Specialty:  Family Medicine Contact information: [place] [address] [phone]            The results of significant diagnostics from this hospitalization (including imaging, microbiology, ancillary and laboratory) are listed below for reference.    Significant Diagnostic Studies: Dg Chest 1 View  Result Date: 04/18/2016 CLINICAL DATA:  Unwitnessed fall EXAM: CHEST 1 VIEW COMPARISON:  05/16/2015. FINDINGS: The heart size and mediastinal contours are within normal limits. Aortic tortuosity and atherosclerosis noted. Both lungs are clear. The visualized skeletal structures are unremarkable. IMPRESSION: No active disease. Electronically Signed   By: Kerby Moors M.D.   On: 04/18/2016 16:30   Ct Head Wo Contrast  Result Date: 04/18/2016 CLINICAL DATA:  Patient with on witnessed fall.  Prior infarcts. EXAM: CT HEAD WITHOUT CONTRAST TECHNIQUE: Contiguous axial images were obtained from the base of the skull through the vertex without intravenous contrast. COMPARISON:  CT brain 12/23/2014; MRI head 12/25/2014. FINDINGS: Brain: Ventricles and sulci are prominent compatible with atrophy. Periventricular and subcortical white matter hypodensity compatible with chronic microvascular ischemic  changes. Chronic bilateral basal ganglia lacunar infarcts. No evidence for acute cortically based infarct, intracranial hemorrhage, mass lesion or mass-effect. Vascular: Internal carotid arterial vascular calcifications. Skull: Intact.  No displaced fracture. Sinuses/Orbits: Unremarkable orbits. Paranasal sinuses are well aerated. Mastoid air cells are unremarkable. Other: None. IMPRESSION: No acute intracranial process. Cortical atrophy and chronic microvascular ischemic changes. Electronically Signed   By: Lovey Newcomer M.D.   On: 04/18/2016 16:33   Pelvis Portable  Result Date: 04/20/2016 CLINICAL DATA:  Right hip hemiarthroplasty. EXAM: PORTABLE PELVIS 1-2 VIEWS COMPARISON:  Radiograph of April 19, 2016. FINDINGS: Right hip dislocation noted on prior exam has been successfully reduced. Right hip prosthesis appears to be well situated. Expected postoperative changes are seen involving the surrounding soft tissues. IMPRESSION: Successful reduction of previously described dislocation of right hip prosthesis. Electronically Signed   By: Marijo Conception, M.D.   On: 04/20/2016 19:45   Pelvis Portable  Result Date: 04/19/2016 CLINICAL DATA:  Status post right hip hemiarthroplasty. EXAM: PORTABLE PELVIS 1-2 VIEWS COMPARISON:  04/18/2016 FINDINGS: Interval right hip hemiarthroplasty. Femoral component has dislocated superiorly, above the acetabulum. Bones are diffusely demineralized. Aortic endograft is incompletely  visualized. IMPRESSION: Superior dislocation of the right femoral component status post right hip hemiarthroplasty. These results will be called to the ordering clinician or representative by the Radiologist Assistant, and communication documented in the PACS or zVision Dashboard. Electronically Signed   By: Misty Stanley M.D.   On: 04/19/2016 17:58   Dg Hip Port Unilat With Pelvis 1v Right  Result Date: 04/19/2016 CLINICAL DATA:  Closed dislocation of right hip EXAM: DG HIP (WITH OR WITHOUT  PELVIS) 1V PORT RIGHT COMPARISON:  None. FINDINGS: Patient has undergone right hip hemiarthroplasty. The femoral head component is superiorly dislocated relative to the acetabulum. Detail is limited with the patient's hand overlying the hip. Skin clips overlie the soft tissues. IMPRESSION: Persistent superior dislocation of the right hip. Electronically Signed   By: Nolon Nations M.D.   On: 04/19/2016 19:38   Dg Hip Unilat  With Pelvis 2-3 Views Right  Result Date: 04/18/2016 CLINICAL DATA:  Right leg shortening in rotation after fall at nursing facility. EXAM: DG HIP (WITH OR WITHOUT PELVIS) 2-3V RIGHT COMPARISON:  None. FINDINGS: Severely displaced proximal right femoral neck fracture is noted. Right femoral head remains situated within acetabulum. Left hip appears normal. IMPRESSION: Severely displaced proximal right femoral neck fracture. Electronically Signed   By: Marijo Conception, M.D.   On: 04/18/2016 16:22    Microbiology: Recent Results (from the past 240 hour(s))  Surgical pcr screen     Status: None   Collection Time: 04/18/16  9:30 PM  Result Value Ref Range Status   MRSA, PCR NEGATIVE NEGATIVE Final   Staphylococcus aureus NEGATIVE NEGATIVE Final    Comment:        The Xpert SA Assay (FDA approved for NASAL specimens in patients over 12 years of age), is one component of a comprehensive surveillance program.  Test performance has been validated by [facility] for patients greater than or equal to 60 year old. It is not intended to diagnose infection nor to guide or monitor treatment.      Labs: Basic Metabolic Panel:  Recent Labs Lab 04/18/16 1546 04/20/16 0552 04/21/16 0504 04/22/16 0530  NA 143 138 138 138  K 3.7 3.6 4.2 3.9  CL 104 103 103 103  CO2 31 23 27 28   GLUCOSE 112* 115* 146* 103*  BUN 18 18 26* 32*  CREATININE 0.90 0.94 0.97 1.01*  CALCIUM 9.3 9.0 8.8* 9.0   Liver Function Tests:  Recent Labs Lab 04/18/16 1546  AST 23  ALT 14  ALKPHOS  57  BILITOT 0.8  PROT 5.7*  ALBUMIN 3.7   No results for input(s): LIPASE, AMYLASE in the last 168 hours. No results for input(s): AMMONIA in the last 168 hours. CBC:  Recent Labs Lab 04/18/16 1546 04/20/16 0552 04/21/16 0504 04/22/16 0530 04/23/16 0528  WBC 5.5 6.2 5.8 7.6 5.4  NEUTROABS 3.5  --   --   --   --   HGB 12.9 10.8* 9.2* 8.3* 8.2*  HCT 38.8 32.6* 28.6* 24.9* 25.3*  MCV 85.1 83.8 83.6 83.8 84.6  PLT 154 198 225 272 265   Cardiac Enzymes: No results for input(s): CKTOTAL, CKMB, CKMBINDEX, TROPONINI in the last 168 hours. BNP: BNP (last 3 results) No results for input(s): BNP in the last 8760 hours.  ProBNP (last 3 results) No results for input(s): PROBNP in the last 8760 hours.  CBG: No results for input(s): GLUCAP in the last 168 hours.     SignedDomenic Polite MD.  [facility] 04/23/2016, 8:13 AM

## 2016-04-24 NOTE — Care Management Important Message (Signed)
Important Message  Patient Details  Name: Rebecca Knight MRN: [id number] Date of Birth: [birth year]   Medicare Important Message Given:  Yes    Garritt Molyneux [name] 04/24/2016, 8:36 AM

## 2016-04-30 ENCOUNTER — Ambulatory Visit (INDEPENDENT_AMBULATORY_CARE_PROVIDER_SITE_OTHER): Payer: Self-pay

## 2016-04-30 ENCOUNTER — Ambulatory Visit (INDEPENDENT_AMBULATORY_CARE_PROVIDER_SITE_OTHER): Payer: Medicare Other | Admitting: Orthopedic Surgery

## 2016-04-30 ENCOUNTER — Encounter (INDEPENDENT_AMBULATORY_CARE_PROVIDER_SITE_OTHER): Payer: Self-pay | Admitting: Orthopedic Surgery

## 2016-04-30 VITALS — Ht 61.0 in | Wt 115.0 lb

## 2016-04-30 DIAGNOSIS — S72141D Displaced intertrochanteric fracture of right femur, subsequent encounter for closed fracture with routine healing: Secondary | ICD-10-CM | POA: Diagnosis not present

## 2016-04-30 DIAGNOSIS — S72141A Displaced intertrochanteric fracture of right femur, initial encounter for closed fracture: Secondary | ICD-10-CM | POA: Insufficient documentation

## 2016-04-30 NOTE — Progress Notes (Signed)
Office Visit Note   Patient: Rebecca Knight           Date of Birth: 28-Nov-1922           MRN: [id number] Visit Date: 04/30/2016              Requested by: [name], DO [place], [place] [address] PCP: [name], DO  Chief Complaint  Patient presents with  . Right Hip - Follow-up    REVISION ARTHROPLASTY BIPOLAR HIP (HEMIARTHROPLASTY) 04/20/16. 10 days post op    HPI: Patient is here for follow up for revision of right hip hemiarthroplasty. She is 10 days post op. She is status post closed reduction after having surgery due to dislocation. She is residing at [facility] facility now. She is currently in knee immobilizer. She complains of persistent pain in right leg, worse when trying to lift leg. Maxcine Ham, RT   Patient has having difficulty progressing with physical therapy Assessment & Plan: Visit Diagnoses:  1. Displaced intertrochanteric fracture of right femur, initial encounter for closed fracture [facility])     Plan: Continue the knee immobilizer for safety wall walking and sleeping. Staples harvested today without complications. Continue physical therapy weightbearing as tolerated with follow-up in the office in 3 weeks.  Follow-Up Instructions: Return in about 3 weeks (around 05/21/2016).   Ortho Exam Examination the incision is healing nicely we will harvest the staples. There is no signs of infection. Radiographs show stable alignment with no complicating features.  Imaging: Xr Hip Unilat W Or W/o Pelvis 2-3 Views Right  Result Date: 04/30/2016 2 view radiographs of the right hip shows stable hemiarthroplasty. The cable for the calcar fracture is intact no signs of displacement. The joint is congruent. The leg length is symmetric.   Orders:  Orders Placed This Encounter  Procedures  . XR HIP UNILAT W OR W/O PELVIS 2-3 VIEWS RIGHT   No orders of the defined types were placed in this encounter.    Procedures: No  procedures performed  Clinical Data: No additional findings.  Subjective: Review of Systems  Objective: Vital Signs: Ht 5\' 1"  (1.549 m)   Wt 115 lb (52.2 kg)   BMI 21.73 kg/m   Specialty Comments:  No specialty comments available.  PMFS History: Patient Active Problem List   Diagnosis Date Noted  . Displaced intertrochanteric fracture of right femur, initial encounter for closed fracture ([place]) 04/30/2016  . Displaced fracture of greater trochanter of right femur, initial encounter for closed fracture ([place])   . Closed displaced fracture of right femoral neck ([place]) 04/18/2016  . Confusion   . Stroke ([place])   . Syncope and collapse   . Cerebral thrombosis with cerebral infarction ([place]) 12/25/2014  . Acute encephalopathy 12/23/2014  . Urinary incontinence 02/10/2012  . Abdominal aneurysm without mention of rupture 04/13/2010  . CHOLELITHIASIS 04/13/2010  . NONSPEC ELEVATION OF LEVELS OF TRANSAMINASE/LDH 04/13/2010  . DIVERTICULOSIS, COLON 06/14/2008  . COLON CANCER, HX OF 06/14/2008  . THYROID NODULE, RIGHT 01/26/2007  . HYPERLIPIDEMIA 01/26/2007  . OSTEOPOROSIS NOS 01/26/2007  . POSTHERPETIC NEURALGIA 11/20/2006  . Essential hypertension 11/20/2006   Past Medical History:  Diagnosis Date  . AAA (abdominal aortic aneurysm) (HCC)    [name]  . Anxiety   . Arthritis   . Calculus of gallbladder without mention of cholecystitis or obstruction   . Cancer [facility])    Colon and Rectal  . Diverticulosis of colon   .  Heart murmur   . Herpes zoster of eye 2002   Dr. Herbert Deaner  . History of colon cancer 1995   Dr Lennie Hummer  . Hyperlipidemia   . Hypertension   . Osteoporosis   . Post herpetic neuralgia   . Varicose veins     Family History  Problem Relation Age of Onset  . Hypertension Father   . Heart attack Father 4  . Stroke Mother     > 20  . Diabetes Sister   . Heart attack Sister 70     her twin  . Diabetes Brother   . Heart attack Brother     in 59s  .  Brain cancer Daughter   . Anesthesia problems Neg Hx   . Hypotension Neg Hx   . Malignant hyperthermia Neg Hx   . Pseudochol deficiency Neg Hx     Past Surgical History:  Procedure Laterality Date  . COLON SURGERY     RESECTION in 1990s  . ENDOVASCULAR STENT INSERTION  08/08/2011   Procedure: ENDOVASCULAR STENT GRAFT INSERTION;  Surgeon: [name], MD;  Location[facility] OR;  Service: Vascular;  Laterality: N/A;  Endo Vascular Aortic stent placement with gore  . EYE SURGERY     CAT EXT  OU  . HIP ARTHROPLASTY Right 04/19/2016   Procedure: ARTHROPLASTY BIPOLAR HIP (HEMIARTHROPLASTY);  Surgeon: [name], MD;  Location: [place];  Service: Orthopedics;  Laterality: Right;  . HIP ARTHROPLASTY Right 04/20/2016   Procedure: REVISION ARTHROPLASTY BIPOLAR HIP (HEMIARTHROPLASTY);  Surgeon: [name], MD;  Location: [place];  Service: Orthopedics;  Laterality: Right;  . HIP CLOSED REDUCTION Right 04/19/2016   Procedure: CLOSED MANIPULATION HIP;  Surgeon: [name], MD;  Location: [place];  Service: Orthopedics;  Laterality: Right;  . TONSILLECTOMY     Social History   Occupational History  . retired    Social History Main Topics  . Smoking status: Never Smoker  . Smokeless tobacco: Never Used  . Alcohol use No  . Drug use: No  . Sexual activity: No

## 2016-05-21 ENCOUNTER — Emergency Department (HOSPITAL_COMMUNITY): Payer: Medicare Other

## 2016-05-21 ENCOUNTER — Encounter (HOSPITAL_COMMUNITY): Payer: Self-pay | Admitting: Emergency Medicine

## 2016-05-21 ENCOUNTER — Inpatient Hospital Stay (HOSPITAL_COMMUNITY)
Admission: EM | Admit: 2016-05-21 | Discharge: 2016-05-28 | DRG: 871 | Disposition: A | Discharge status: Skilled Nursing Facility | Payer: Medicare Other | Attending: Family Medicine | Admitting: Family Medicine

## 2016-05-21 DIAGNOSIS — Z95828 Presence of other vascular implants and grafts: Secondary | ICD-10-CM

## 2016-05-21 DIAGNOSIS — N179 Acute kidney failure, unspecified: Secondary | ICD-10-CM | POA: Diagnosis present

## 2016-05-21 DIAGNOSIS — E785 Hyperlipidemia, unspecified: Secondary | ICD-10-CM | POA: Diagnosis present

## 2016-05-21 DIAGNOSIS — Z8673 Personal history of transient ischemic attack (TIA), and cerebral infarction without residual deficits: Secondary | ICD-10-CM

## 2016-05-21 DIAGNOSIS — Z79899 Other long term (current) drug therapy: Secondary | ICD-10-CM

## 2016-05-21 DIAGNOSIS — F419 Anxiety disorder, unspecified: Secondary | ICD-10-CM | POA: Diagnosis present

## 2016-05-21 DIAGNOSIS — Z96641 Presence of right artificial hip joint: Secondary | ICD-10-CM | POA: Diagnosis present

## 2016-05-21 DIAGNOSIS — F0390 Unspecified dementia without behavioral disturbance: Secondary | ICD-10-CM | POA: Diagnosis present

## 2016-05-21 DIAGNOSIS — A419 Sepsis, unspecified organism: Principal | ICD-10-CM | POA: Diagnosis present

## 2016-05-21 DIAGNOSIS — R32 Unspecified urinary incontinence: Secondary | ICD-10-CM | POA: Diagnosis present

## 2016-05-21 DIAGNOSIS — Z880 Allergy status to penicillin: Secondary | ICD-10-CM | POA: Diagnosis not present

## 2016-05-21 DIAGNOSIS — M810 Age-related osteoporosis without current pathological fracture: Secondary | ICD-10-CM | POA: Diagnosis present

## 2016-05-21 DIAGNOSIS — E782 Mixed hyperlipidemia: Secondary | ICD-10-CM | POA: Diagnosis present

## 2016-05-21 DIAGNOSIS — Z7901 Long term (current) use of anticoagulants: Secondary | ICD-10-CM | POA: Diagnosis not present

## 2016-05-21 DIAGNOSIS — E876 Hypokalemia: Secondary | ICD-10-CM | POA: Clinically undetermined

## 2016-05-21 DIAGNOSIS — G9341 Metabolic encephalopathy: Secondary | ICD-10-CM | POA: Diagnosis present

## 2016-05-21 DIAGNOSIS — R011 Cardiac murmur, unspecified: Secondary | ICD-10-CM | POA: Diagnosis present

## 2016-05-21 DIAGNOSIS — N390 Urinary tract infection, site not specified: Secondary | ICD-10-CM | POA: Diagnosis present

## 2016-05-21 DIAGNOSIS — Z79891 Long term (current) use of opiate analgesic: Secondary | ICD-10-CM

## 2016-05-21 DIAGNOSIS — I8390 Asymptomatic varicose veins of unspecified lower extremity: Secondary | ICD-10-CM | POA: Diagnosis present

## 2016-05-21 DIAGNOSIS — R531 Weakness: Secondary | ICD-10-CM

## 2016-05-21 DIAGNOSIS — F0391 Unspecified dementia with behavioral disturbance: Secondary | ICD-10-CM | POA: Diagnosis not present

## 2016-05-21 DIAGNOSIS — I10 Essential (primary) hypertension: Secondary | ICD-10-CM | POA: Diagnosis present

## 2016-05-21 DIAGNOSIS — Z9049 Acquired absence of other specified parts of digestive tract: Secondary | ICD-10-CM

## 2016-05-21 DIAGNOSIS — G9340 Encephalopathy, unspecified: Secondary | ICD-10-CM | POA: Diagnosis present

## 2016-05-21 DIAGNOSIS — F0150 Vascular dementia without behavioral disturbance: Secondary | ICD-10-CM | POA: Diagnosis not present

## 2016-05-21 DIAGNOSIS — Z8249 Family history of ischemic heart disease and other diseases of the circulatory system: Secondary | ICD-10-CM

## 2016-05-21 DIAGNOSIS — R4182 Altered mental status, unspecified: Secondary | ICD-10-CM | POA: Diagnosis not present

## 2016-05-21 DIAGNOSIS — E860 Dehydration: Secondary | ICD-10-CM | POA: Diagnosis present

## 2016-05-21 DIAGNOSIS — R1310 Dysphagia, unspecified: Secondary | ICD-10-CM

## 2016-05-21 DIAGNOSIS — E870 Hyperosmolality and hypernatremia: Secondary | ICD-10-CM | POA: Diagnosis present

## 2016-05-21 DIAGNOSIS — N19 Unspecified kidney failure: Secondary | ICD-10-CM

## 2016-05-21 DIAGNOSIS — B0229 Other postherpetic nervous system involvement: Secondary | ICD-10-CM | POA: Diagnosis present

## 2016-05-21 DIAGNOSIS — Z808 Family history of malignant neoplasm of other organs or systems: Secondary | ICD-10-CM

## 2016-05-21 DIAGNOSIS — Z85038 Personal history of other malignant neoplasm of large intestine: Secondary | ICD-10-CM | POA: Diagnosis not present

## 2016-05-21 DIAGNOSIS — L8990 Pressure ulcer of unspecified site, unspecified stage: Secondary | ICD-10-CM | POA: Insufficient documentation

## 2016-05-21 DIAGNOSIS — R680 Hypothermia, not associated with low environmental temperature: Secondary | ICD-10-CM | POA: Diagnosis present

## 2016-05-21 DIAGNOSIS — Z7982 Long term (current) use of aspirin: Secondary | ICD-10-CM

## 2016-05-21 DIAGNOSIS — Z833 Family history of diabetes mellitus: Secondary | ICD-10-CM

## 2016-05-21 DIAGNOSIS — Z823 Family history of stroke: Secondary | ICD-10-CM

## 2016-05-21 LAB — COMPREHENSIVE METABOLIC PANEL
ALT: 8 U/L — AB (ref 14–54)
AST: 16 U/L (ref 15–41)
Albumin: 2.4 g/dL — ABNORMAL LOW (ref 3.5–5.0)
Alkaline Phosphatase: 94 U/L (ref 38–126)
Anion gap: 13 (ref 5–15)
BILIRUBIN TOTAL: 0.7 mg/dL (ref 0.3–1.2)
BUN: 86 mg/dL — AB (ref 6–20)
CO2: 26 mmol/L (ref 22–32)
CREATININE: 2.3 mg/dL — AB (ref 0.44–1.00)
Calcium: 9.3 mg/dL (ref 8.9–10.3)
Chloride: 105 mmol/L (ref 101–111)
GFR calc Af Amer: 20 mL/min — ABNORMAL LOW (ref >60)
GFR, EST NON AFRICAN AMERICAN: 17 mL/min — AB (ref >60)
Glucose, Bld: 135 mg/dL — ABNORMAL HIGH (ref 65–99)
Potassium: 3.6 mmol/L (ref 3.5–5.1)
Sodium: 144 mmol/L (ref 135–145)
TOTAL PROTEIN: 5 g/dL — AB (ref 6.5–8.1)

## 2016-05-21 LAB — URINALYSIS, MICROSCOPIC (REFLEX)

## 2016-05-21 LAB — URINALYSIS, ROUTINE W REFLEX MICROSCOPIC
GLUCOSE, UA: 100 mg/dL — AB
KETONES UR: NEGATIVE mg/dL
Nitrite: NEGATIVE
PROTEIN: 100 mg/dL — AB
Specific Gravity, Urine: 1.025 (ref 1.005–1.030)
pH: 7.5 (ref 5.0–8.0)

## 2016-05-21 LAB — CBC WITH DIFFERENTIAL/PLATELET
Basophils Absolute: 0 10*3/uL (ref 0.0–0.1)
Basophils Relative: 0 %
EOS ABS: 0 10*3/uL (ref 0.0–0.7)
EOS PCT: 0 %
HEMATOCRIT: 36.4 % (ref 36.0–46.0)
Hemoglobin: 11.1 g/dL — ABNORMAL LOW (ref 12.0–15.0)
Lymphocytes Relative: 4 %
Lymphs Abs: 1.1 10*3/uL (ref 0.7–4.0)
MCH: 26.1 pg (ref 26.0–34.0)
MCHC: 30.5 g/dL (ref 30.0–36.0)
MCV: 85.6 fL (ref 78.0–100.0)
MONO ABS: 1.1 10*3/uL — AB (ref 0.1–1.0)
Monocytes Relative: 4 %
NEUTROS ABS: 26.2 10*3/uL — AB (ref 1.7–7.7)
NEUTROS PCT: 92 %
Platelets: 303 10*3/uL (ref 150–400)
RBC: 4.25 MIL/uL (ref 3.87–5.11)
RDW: 15.2 % (ref 11.5–15.5)
WBC: 28.4 10*3/uL — ABNORMAL HIGH (ref 4.0–10.5)

## 2016-05-21 LAB — LACTIC ACID, PLASMA
LACTIC ACID, VENOUS: 2.1 mmol/L — AB (ref 0.5–1.9)
LACTIC ACID, VENOUS: 1.4 mmol/L (ref 0.5–1.9)

## 2016-05-21 LAB — I-STAT CG4 LACTIC ACID, ED
LACTIC ACID, VENOUS: 1.58 mmol/L (ref 0.5–1.9)
LACTIC ACID, VENOUS: 2.84 mmol/L — AB (ref 0.5–1.9)

## 2016-05-21 LAB — PROTIME-INR
INR: 1.36
PROTHROMBIN TIME: 16.9 s — AB (ref 11.4–15.2)

## 2016-05-21 LAB — APTT: aPTT: 30 seconds (ref 24–36)

## 2016-05-21 LAB — PROCALCITONIN: PROCALCITONIN: 0.52 ng/mL

## 2016-05-21 MED ORDER — SODIUM CHLORIDE 0.9 % IV SOLN: 250.0000 mL | INTRAVENOUS | Status: DC | PRN

## 2016-05-21 MED ORDER — SODIUM CHLORIDE 0.9% FLUSH: 3.0000 mL | INTRAVENOUS | Status: DC | PRN

## 2016-05-21 MED ORDER — SODIUM CHLORIDE 0.9% FLUSH
3.0000 mL | Freq: Two times a day (BID) | INTRAVENOUS | Status: DC
  Administered 2016-05-27: 3 mL via INTRAVENOUS

## 2016-05-21 MED ORDER — DEXTROSE 5 % IV SOLN
1.0000 g | INTRAVENOUS | Status: AC
  Administered 2016-05-22 – 2016-05-26 (×5): 1 g via INTRAVENOUS
  Filled 2016-05-21 (×5): qty 10

## 2016-05-21 MED ORDER — ONDANSETRON HCL 4 MG PO TABS: 4.0000 mg | ORAL_TABLET | Freq: Four times a day (QID) | ORAL | Status: DC | PRN

## 2016-05-21 MED ORDER — SODIUM CHLORIDE 0.9 % IV BOLUS (SEPSIS)
1000.0000 mL | Freq: Once | INTRAVENOUS | Status: AC
  Administered 2016-05-21: 1000 mL via INTRAVENOUS

## 2016-05-21 MED ORDER — ACETAMINOPHEN 650 MG RE SUPP: 650.0000 mg | Freq: Four times a day (QID) | RECTAL | Status: DC | PRN

## 2016-05-21 MED ORDER — POLYVINYL ALCOHOL 1.4 % OP SOLN
1.0000 [drp] | Freq: Four times a day (QID) | OPHTHALMIC | Status: DC
Start: 2016-05-21 — End: 2016-05-29
  Administered 2016-05-21 – 2016-05-28 (×22): 1 [drp] via OPHTHALMIC
  Filled 2016-05-21 (×2): qty 15

## 2016-05-21 MED ORDER — HYDROCODONE-ACETAMINOPHEN 5-325 MG PO TABS: 1.0000 | ORAL_TABLET | Freq: Four times a day (QID) | ORAL | Status: DC | PRN

## 2016-05-21 MED ORDER — ONDANSETRON HCL 4 MG/2ML IJ SOLN: 4.0000 mg | Freq: Four times a day (QID) | INTRAMUSCULAR | Status: DC | PRN

## 2016-05-21 MED ORDER — SENNA 8.6 MG PO TABS
1.0000 | ORAL_TABLET | Freq: Two times a day (BID) | ORAL | Status: DC | PRN
  Filled 2016-05-21: qty 1

## 2016-05-21 MED ORDER — METOPROLOL SUCCINATE ER 50 MG PO TB24
50.0000 mg | ORAL_TABLET | Freq: Every day | ORAL | Status: DC
  Administered 2016-05-22 – 2016-05-25 (×4): 50 mg via ORAL
  Filled 2016-05-21 (×4): qty 1

## 2016-05-21 MED ORDER — POLYETHYL GLYCOL-PROPYL GLYCOL 0.4-0.3 % OP SOLN: 1.0000 [drp] | Freq: Four times a day (QID) | OPHTHALMIC | Status: DC

## 2016-05-21 MED ORDER — SODIUM CHLORIDE 0.9% FLUSH: 3.0000 mL | Freq: Two times a day (BID) | INTRAVENOUS | Status: DC

## 2016-05-21 MED ORDER — MIRTAZAPINE 15 MG PO TBDP
7.5000 mg | ORAL_TABLET | Freq: Every day | ORAL | Status: DC
  Administered 2016-05-21 – 2016-05-27 (×7): 7.5 mg via ORAL
  Filled 2016-05-21 (×9): qty 0.5

## 2016-05-21 MED ORDER — DEXTROSE 5 % IV SOLN
1.0000 g | Freq: Once | INTRAVENOUS | Status: AC
  Administered 2016-05-21: 1 g via INTRAVENOUS
  Filled 2016-05-21: qty 10

## 2016-05-21 MED ORDER — HEPARIN SODIUM (PORCINE) 5000 UNIT/ML IJ SOLN
5000.0000 [IU] | Freq: Three times a day (TID) | INTRAMUSCULAR | Status: DC
  Administered 2016-05-21 – 2016-05-28 (×21): 5000 [IU] via SUBCUTANEOUS
  Filled 2016-05-21 (×19): qty 1

## 2016-05-21 MED ORDER — ASPIRIN EC 81 MG PO TBEC
81.0000 mg | DELAYED_RELEASE_TABLET | Freq: Every day | ORAL | Status: DC
  Administered 2016-05-22 – 2016-05-28 (×7): 81 mg via ORAL
  Filled 2016-05-21 (×7): qty 1

## 2016-05-21 MED ORDER — SODIUM CHLORIDE 0.9 % IV SOLN
INTRAVENOUS | Status: DC
  Administered 2016-05-21 – 2016-05-22 (×3): via INTRAVENOUS

## 2016-05-21 MED ORDER — GABAPENTIN 100 MG PO CAPS
100.0000 mg | ORAL_CAPSULE | Freq: Two times a day (BID) | ORAL | Status: DC
  Administered 2016-05-21 – 2016-05-28 (×14): 100 mg via ORAL
  Filled 2016-05-21 (×14): qty 1

## 2016-05-21 MED ORDER — DEXTROSE 5 % IV SOLN: 1.0000 g | INTRAVENOUS | Status: DC

## 2016-05-21 MED ORDER — PRAVASTATIN SODIUM 20 MG PO TABS
20.0000 mg | ORAL_TABLET | Freq: Every day | ORAL | Status: DC
  Administered 2016-05-21 – 2016-05-28 (×7): 20 mg via ORAL
  Filled 2016-05-21 (×7): qty 1

## 2016-05-21 MED ORDER — ACETAMINOPHEN 325 MG PO TABS
650.0000 mg | ORAL_TABLET | Freq: Four times a day (QID) | ORAL | Status: DC | PRN
  Administered 2016-05-26: 650 mg via ORAL
  Filled 2016-05-21: qty 2

## 2016-05-21 NOTE — ED Triage Notes (Addendum)
Patient with [facility] from [facility] for abnormal lab results, BUN 68.3 and WBC 38.7.  Patient is oriented and lethargic, in no apparent distress at this time.

## 2016-05-21 NOTE — H&P (Signed)
History and Physical    Rebecca Knight [id number] DOB: [birth year] DOA: 05/21/2016   PCP: [name], DO   Patient coming from/Resides with: [facility]  Admission status: Inpatient/telemetry -medically necessary to stay a minimum 2 midnights to rule out impending and/or unexpected changes in physiologic status that may differ from initial evaluation performed in the ER and/or at time of admission. Patient presents with altered mentation, positive sepsis physiology and pyuria concerning for UTI. She will require IV fluid hydration, telemetry monitoring, at a minimum daily labs, close monitoring for development of acute delirium in setting of infection.  Chief Complaint: Abnormal labs  HPI: Rebecca Knight is a 83 y.o. female with medical history significant for dementia, urinary incontinence, hypertension, dyslipidemia, prior CVA, remote colon cancer. Patient was recently discharged on 1/2 afternoon admission for hip fracture and subsequent hemiarthroplasty on 12/29. The patient's son, she has had progressive altered mentation for several days associated with poor oral intake. Labs were obtained at the facility that revealed acute kidney injury and leukocytosis so she was sent to the ER for further evaluation and treatment.  ED Course:  Vital Signs: BP 128/68   Pulse 84   Temp 97.5 F (36.4 C) (Rectal)   Resp 17   SpO2 97%  CT head without contrast: No acute intracranial abnormality noted 2 view chest x-ray: No active cardiopulmonary disease Lab data: Sodium 144, potassium 3.6, chloride 105, CO2 26, glucose 135, BUN 86, creatinine 2.3, albumin 2.4, LFTs normal, initial lactic acid 1.58 with repeat up to 2.84, white count 28,400 with neutrophils 92%, absolute neutrophil 26.2%, hemoglobin 11.1, platelets 303,000. Urinalysis markedly abnormal as follows: Turbid appearance, many bacteria, small bilirubin, 100 glucose, small hemoglobin, moderate leukocytes, negative nitrite, protein 100,  squamous epithelials too numerous to count, WBC too numerous to count, specific gravity 1.02, blood cultures 2 have been obtained in the ER. Medications and treatments: Normal saline bolus Tums 2 L, Rocephin 1 g IV 1  Review of Systems:  In addition to the HPI above, **unable to obtain from patient due to altered mentation therefore obtained from the nursing home records and from EDP No reported Fever-chills, myalgias or other constitutional symptoms No Headache, changes with Vision or hearing, new weakness, tingling, numbness in any extremity, dizziness, dysarthria or word finding difficulty, gait disturbance or imbalance, tremors or seizure activity No problems swallowing food or Liquids, indigestion/reflux, choking or coughing while eating, abdominal pain with or after eating No Chest pain, Cough or Shortness of Breath, palpitations, orthopnea or DOE No Abdominal pain, N/V, melena,hematochezia, dark tarry stools, constipation No new skin rashes, lesions, masses or bruises, No new joint pains, aches, swelling or redness No recent unintentional weight gain or loss No polyuria, polydypsia or polyphagia   Past Medical History:  Diagnosis Date  . AAA (abdominal aortic aneurysm) (HCC)    [name]  . Anxiety   . Arthritis   . Calculus of gallbladder without mention of cholecystitis or obstruction   . Cancer [facility])    Colon and Rectal  . Diverticulosis of colon   . Heart murmur   . Herpes zoster of eye 2002   Dr. Herbert Deaner  . History of colon cancer 1995   Dr Lennie Hummer  . Hyperlipidemia   . Hypertension   . Osteoporosis   . Post herpetic neuralgia   . Varicose veins     Past Surgical History:  Procedure Laterality Date  . COLON SURGERY     RESECTION in 1990s  .  ENDOVASCULAR STENT INSERTION  08/08/2011   Procedure: ENDOVASCULAR STENT GRAFT INSERTION;  Surgeon: [name], MD;  Location: [facility] OR;  Service: Vascular;  Laterality: N/A;  Endo Vascular Aortic stent placement with  gore  . EYE SURGERY     CAT EXT  OU  . HIP ARTHROPLASTY Right 04/19/2016   Procedure: ARTHROPLASTY BIPOLAR HIP (HEMIARTHROPLASTY);  Surgeon: [name], MD;  Location: [place];  Service: Orthopedics;  Laterality: Right;  . HIP ARTHROPLASTY Right 04/20/2016   Procedure: REVISION ARTHROPLASTY BIPOLAR HIP (HEMIARTHROPLASTY);  Surgeon: [name], MD;  Location: [place];  Service: Orthopedics;  Laterality: Right;  . HIP CLOSED REDUCTION Right 04/19/2016   Procedure: CLOSED MANIPULATION HIP;  Surgeon: [name], MD;  Location: [place];  Service: Orthopedics;  Laterality: Right;  . TONSILLECTOMY      Social History   Social History  . Marital status: Widowed    Spouse name: N/A  . Number of children: 2  . Years of education: N/A   Occupational History  . retired    Social History Main Topics  . Smoking status: Never Smoker  . Smokeless tobacco: Never Used  . Alcohol use No  . Drug use: No  . Sexual activity: No   Other Topics Concern  . Not on file   Social History Narrative  . No narrative on file    Mobility: Rolling walker based on previous PT evaluation on 12/31 Work history: Not obtained   Allergies  Allergen Reactions  . Penicillins Rash    Has patient had a PCN reaction causing immediate rash, facial/tongue/throat swelling, SOB or lightheadedness with hypotension: Yes Has patient had a PCN reaction causing severe rash involving mucus membranes or skin necrosis: Yes Has patient had a PCN reaction that required hospitalization No Has patient had a PCN reaction occurring within the last 10 years: No If all of the above answers are "NO", then may proceed with Cephalosporin use.      Family History  Problem Relation Age of Onset  . Hypertension Father   . Heart attack Father 13  . Stroke Mother     > 32  . Diabetes Sister   . Heart attack Sister 30     her twin  . Diabetes Brother   . Heart attack Brother     in 68s  . Brain cancer Daughter   .  Anesthesia problems Neg Hx   . Hypotension Neg Hx   . Malignant hyperthermia Neg Hx   . Pseudochol deficiency Neg Hx      Prior to Admission medications   Medication Sig Start Date End Date Taking? Authorizing Provider  acetaminophen (TYLENOL) 325 MG tablet Take 2 tablets (650 mg total) by mouth every 6 (six) hours as needed for mild pain (or Fever >/= 101). 04/23/16  Yes Domenic Polite, MD  aspirin EC 81 MG tablet Take 81 mg by mouth daily.   Yes Historical Provider, MD  enoxaparin (LOVENOX) 40 MG/0.4ML injection Inject 0.4 mLs (40 mg total) into the skin daily. For 4weeks 04/23/16  Yes Domenic Polite, MD  gabapentin (NEURONTIN) 100 MG capsule Take 100 mg by mouth 2 (two) times daily.    Yes Historical Provider, MD  gabapentin (NEURONTIN) 300 MG capsule  03/13/16  Yes Historical Provider, MD  HYDROcodone-acetaminophen (NORCO/VICODIN) 5-325 MG tablet Take 1-2 tablets by mouth every 6 (six) hours as needed for moderate pain. 04/23/16  Yes Domenic Polite, MD  losartan (COZAAR) 100 MG tablet Take 1  tablet by mouth daily 11/26/13  Yes Historical Provider, MD  magnesium hydroxide (MILK OF MAGNESIA) 400 MG/5ML suspension Take 30 mLs by mouth daily as needed for mild constipation.   Yes Historical Provider, MD  metoprolol succinate (TOPROL-XL) 50 MG 24 hr tablet Take 50 mg by mouth daily.  05/03/15  Yes Historical Provider, MD  mirtazapine (REMERON SOL-TAB) 15 MG disintegrating tablet Take 7.5 mg by mouth at bedtime.   Yes Historical Provider, MD  Polyethyl Glycol-Propyl Glycol (SYSTANE) 0.4-0.3 % SOLN Apply 1 drop to eye 4 (four) times daily. Reported on 05/13/2015   Yes Historical Provider, MD  pravastatin (PRAVACHOL) 20 MG tablet 1 BY MOUTH DAILY 10/04/13  Yes [name], MD  senna (SENOKOT) 8.6 MG tablet Take 1 tablet (8.6 mg total) by mouth 2 (two) times daily as needed for constipation. 04/23/16  Yes Domenic Polite, MD    Physical Exam: Vitals:   05/21/16 1451 05/21/16 1500 05/21/16 1537 05/21/16  1630  BP: 110/81 124/68 (!) 119/102 128/68  Pulse: 82  79 84  Resp: 18 18 18 17   Temp:      TempSrc:      SpO2: 100%  92% 97%      Constitutional: NAD, calm, comfortable;Was only confused Eyes: PERRL, lids and conjunctivae normal ENMT: Mucous membranes are dry. Posterior pharynx clear of any exudate or lesions.Normal dentition.  Neck: normal, supple, no masses, no thyromegaly Respiratory: clear to auscultation bilaterally, no wheezing, no crackles. Normal respiratory effort. No accessory muscle use.  Cardiovascular: Regular rate and rhythm, no murmurs / rubs / gallops. No extremity edema. 2+ pedal pulses. No carotid bruits.  Abdomen: Mild suprapubic tenderness, no masses palpated. No hepatosplenomegaly. Bowel sounds positive.  Musculoskeletal: no clubbing / cyanosis. No joint deformity upper and lower extremities. Good ROM, no contractures. Normal muscle tone.  Skin: no rashes, lesions, ulcers. No induration Neurologic: CN 2-12 grossly intact. Sensation intact, DTR normal. Strength 5/5 x all 4 extremities.  Psychiatric: Alert and oriented x name only. Normal mood. I will confused and stating that she needed to get home.   Labs on Admission: I have personally reviewed following labs and imaging studies  CBC:  Recent Labs Lab 05/21/16 1317  WBC 28.4*  NEUTROABS 26.2*  HGB 11.1*  HCT 36.4  MCV 85.6  PLT [id number]   Basic Metabolic Panel:  Recent Labs Lab 05/21/16 1317  NA 144  K 3.6  CL 105  CO2 26  GLUCOSE 135*  BUN 86*  CREATININE 2.30*  CALCIUM 9.3   GFR: CrCl cannot be calculated (Unknown ideal weight.). Liver Function Tests:  Recent Labs Lab 05/21/16 1317  AST 16  ALT 8*  ALKPHOS 94  BILITOT 0.7  PROT 5.0*  ALBUMIN 2.4*   No results for input(s): LIPASE, AMYLASE in the last 168 hours. No results for input(s): AMMONIA in the last 168 hours. Coagulation Profile: No results for input(s): INR, PROTIME in the last 168 hours. Cardiac Enzymes: No results  for input(s): CKTOTAL, CKMB, CKMBINDEX, TROPONINI in the last 168 hours. BNP (last 3 results) No results for input(s): PROBNP in the last 8760 hours. HbA1C: No results for input(s): HGBA1C in the last 72 hours. CBG: No results for input(s): GLUCAP in the last 168 hours. Lipid Profile: No results for input(s): CHOL, HDL, LDLCALC, TRIG, CHOLHDL, LDLDIRECT in the last 72 hours. Thyroid Function Tests: No results for input(s): TSH, T4TOTAL, FREET4, T3FREE, THYROIDAB in the last 72 hours. Anemia Panel: No results for input(s): VITAMINB12, FOLATE, FERRITIN,  TIBC, IRON, RETICCTPCT in the last 72 hours. Urine analysis:    Component Value Date/Time   COLORURINE YELLOW 05/21/2016 1625   APPEARANCEUR TURBID (A) 05/21/2016 1625   LABSPEC 1.025 05/21/2016 1625   PHURINE 7.5 05/21/2016 1625   GLUCOSEU 100 (A) 05/21/2016 1625   HGBUR SMALL (A) 05/21/2016 1625   BILIRUBINUR SMALL (A) 05/21/2016 1625   KETONESUR NEGATIVE 05/21/2016 1625   PROTEINUR 100 (A) 05/21/2016 1625   UROBILINOGEN 0.2 12/23/2014 1251   NITRITE NEGATIVE 05/21/2016 1625   LEUKOCYTESUR MODERATE (A) 05/21/2016 1625   Sepsis Labs: @LABRCNTIP (procalcitonin:4,lacticidven:4) )No results found for this or any previous visit (from the past 240 hour(s)).   Radiological Exams on Admission: Dg Chest 2 View  Result Date: 05/21/2016 CLINICAL DATA:  Weakness, shortness of breath. EXAM: CHEST  2 VIEW COMPARISON:  Radiograph of April 18, 2016. FINDINGS: Stable cardiomediastinal silhouette. Atherosclerosis thoracic aorta is noted. No pneumothorax or pleural effusion is noted. No acute pulmonary disease is noted. Bony thorax is unremarkable. IMPRESSION: No active cardiopulmonary disease.  Aortic atherosclerosis. Electronically Signed   By: Marijo Conception, M.D.   On: 05/21/2016 14:05   Ct Head Wo Contrast  Result Date: 05/21/2016 CLINICAL DATA:  83 year old female with lethargy. No injury. Hypertension. Initial encounter. EXAM: CT HEAD  WITHOUT CONTRAST TECHNIQUE: Contiguous axial images were obtained from the base of the skull through the vertex without intravenous contrast. COMPARISON:  04/18/2016 CT.  12/25/2014 MR. FINDINGS: Brain: No intracranial hemorrhage or CT evidence of large acute infarct. Remote left external capsule/ caudate infarct. Remote right lenticular nucleus infarct. Prominent chronic microvascular changes. Moderate global atrophy without hydrocephalus. No intracranial mass lesion noted on this unenhanced exam. Vascular: Vascular calcifications. Skull: Negative. Sinuses/Orbits: Post lens replacement. No acute orbital abnormality. Visualized paranasal sinuses, mastoid air cells and middle ear cavities are clear. Other: Negative IMPRESSION: No acute intracranial abnormality noted. Remote left external capsule/ caudate infarct. Remote right lenticular nucleus infarct. Prominent chronic microvascular changes. Moderate global atrophy. Electronically Signed   By: Genia Del M.D.   On: 05/21/2016 13:41    EKG: (Independently reviewed) sinus rhythm with ventricular rate 85 bpm, QTC 457 ms, no acute ischemic changes  Assessment/Plan Principal Problem:   Sepsis  -Presents with the following sepsis physiology: Altered mental status, white count greater than 12,000 with greater than 10% bands, hypothermia with initial rectal temperature 97.5, acute kidney injury and second lactic acid elevated at 2.84. Also demonstrated positive SOFA criteria of altered mentation and systolic blood pressure less than 100 -Treat underlying causes -Continue resuscitation-does not meet criteria for 30 mL/kg fluid challenge and has responded well to fluids ordered by EDP (improved mentation, increased urinary output, improved systolic blood pressure) -Sepsis order set initiated -Procalcitonin -Continue to trend lactic acid -Systolic blood pressure mildly suboptimal but MAP been consistently greater than 65   Active Problems:   Acute  encephalopathy -Secondary to sepsis physiology -EDP reported improvement in mentation after given IV fluid boluses     UTI (urinary tract infection) -Patient has abnormal urinalysis consistent with UTI -Follow up on blood cultures -Obtain urine culture -Empiric Rocephin    Essential hypertension -Current blood pressure suboptimal so preadmission Toprol on hold    Acute kidney injury  -Baseline: 32/1.01 -Current renal function:  86/2.3 -Repeat labs in a.m.     Dehydration, severe -Continue IV fluid hydration -Encourage clear liquids and advance diet as tolerated    HYPERLIPIDEMIA -Continue Pravachol    History of right hip hemiarthroplasty 04/19/16 -Continue physical  therapy    Dementia -Continue Remeron      DVT prophylaxis: Subcutaneous heparin Code Status: Full Family Communication: Family at bedside at time of evaluation Disposition Plan: Anticipate return to skilled nursing facility when medically stable Consults called: None    Amany Rando L. ANP-BC [facility] Pager [phone]   If 7PM-7AM, please contact night-coverage [email or web address] Password [email or web address]  05/21/2016, 5:27 PM

## 2016-05-21 NOTE — ED Notes (Signed)
Assisted Carlis Abbott, RN again with in and out cath; cath was successful and urine was collected and sent to lab for testing; visitors stepped out for privacy

## 2016-05-21 NOTE — ED Notes (Signed)
Placed patient back on monitor, continuous pulse oximetry and blood pressure cuff

## 2016-05-21 NOTE — ED Notes (Signed)
Assisted Carlis Abbott, RN with in and out cath; Carlis Abbott, RN was unsuccessful with obtaining an urine sample; visitors stepped out for privacy

## 2016-05-21 NOTE — ED Notes (Signed)
Patient undressed, in gown, on monitor, continuous pulse oximetry and blood pressure cuff; rectal temperature performed

## 2016-05-21 NOTE — ED Provider Notes (Signed)
[facility] DEPT Provider Note   CSN: [id number] Arrival date & time: 05/21/16  1239     History   Chief Complaint Chief Complaint  Patient presents with  . Abnormal Lab    HPI Rebecca Knight is a 83 y.o. female.   Weakness  Primary symptoms comment: overall. This is a new problem. The problem has not changed since onset.There was no focality noted. There has been no fever. Pertinent negatives include no shortness of breath, no chest pain, no vomiting, no confusion and no headaches.    Past Medical History:  Diagnosis Date  . AAA (abdominal aortic aneurysm) (HCC)    [name]  . Anxiety   . Arthritis   . Calculus of gallbladder without mention of cholecystitis or obstruction   . Cancer [facility])    Colon and Rectal  . Diverticulosis of colon   . Heart murmur   . Herpes zoster of eye 2002   Dr. Herbert Deaner  . History of colon cancer 1995   Dr Lennie Hummer  . Hyperlipidemia   . Hypertension   . Osteoporosis   . Post herpetic neuralgia   . Varicose veins     Patient Active Problem List   Diagnosis Date Noted  . Displaced intertrochanteric fracture of right femur, initial encounter for closed fracture ([place]) 04/30/2016  . Displaced fracture of greater trochanter of right femur, initial encounter for closed fracture ([place])   . Closed displaced fracture of right femoral neck ([place]) 04/18/2016  . Confusion   . Stroke ([place])   . Syncope and collapse   . Cerebral thrombosis with cerebral infarction ([place]) 12/25/2014  . Acute encephalopathy 12/23/2014  . Urinary incontinence 02/10/2012  . Abdominal aneurysm without mention of rupture 04/13/2010  . CHOLELITHIASIS 04/13/2010  . NONSPEC ELEVATION OF LEVELS OF TRANSAMINASE/LDH 04/13/2010  . DIVERTICULOSIS, COLON 06/14/2008  . COLON CANCER, HX OF 06/14/2008  . THYROID NODULE, RIGHT 01/26/2007  . HYPERLIPIDEMIA 01/26/2007  . OSTEOPOROSIS NOS 01/26/2007  . POSTHERPETIC NEURALGIA 11/20/2006  . Essential hypertension 11/20/2006     Past Surgical History:  Procedure Laterality Date  . COLON SURGERY     RESECTION in 1990s  . ENDOVASCULAR STENT INSERTION  08/08/2011   Procedure: ENDOVASCULAR STENT GRAFT INSERTION;  Surgeon: [name], MD;  Location[facility] OR;  Service: Vascular;  Laterality: N/A;  Endo Vascular Aortic stent placement with gore  . EYE SURGERY     CAT EXT  OU  . HIP ARTHROPLASTY Right 04/19/2016   Procedure: ARTHROPLASTY BIPOLAR HIP (HEMIARTHROPLASTY);  Surgeon: [name], MD;  Location: [place];  Service: Orthopedics;  Laterality: Right;  . HIP ARTHROPLASTY Right 04/20/2016   Procedure: REVISION ARTHROPLASTY BIPOLAR HIP (HEMIARTHROPLASTY);  Surgeon: [name], MD;  Location: [facility];  Service: Orthopedics;  Laterality: Right;  . HIP CLOSED REDUCTION Right 04/19/2016   Procedure: CLOSED MANIPULATION HIP;  Surgeon: [name], MD;  Location: [facility];  Service: Orthopedics;  Laterality: Right;  . TONSILLECTOMY      OB History    No data available       Home Medications    Prior to Admission medications   Medication Sig Start Date End Date Taking? Authorizing Provider  acetaminophen (TYLENOL) 325 MG tablet Take 2 tablets (650 mg total) by mouth every 6 (six) hours as needed for mild pain (or Fever >/= 101). 04/23/16   Domenic Polite, MD  aspirin EC 81 MG tablet Take 81 mg by mouth daily.    Historical Provider,  MD  enoxaparin (LOVENOX) 40 MG/0.4ML injection Inject 0.4 mLs (40 mg total) into the skin daily. For 4weeks 04/23/16   Domenic Polite, MD  gabapentin (NEURONTIN) 100 MG capsule Take 100-300 mg by mouth 3 (three) times daily. Take 100mg  by mouth at 0800 and 1400 and 300mg  by mouth at [place] Provider, MD  gabapentin (NEURONTIN) 300 MG capsule  03/13/16   Historical Provider, MD  HYDROcodone-acetaminophen (NORCO/VICODIN) 5-325 MG tablet Take 1-2 tablets by mouth every 6 (six) hours as needed for moderate pain. 04/23/16   Domenic Polite, MD  losartan (COZAAR) 100 MG tablet Take 1  tablet by mouth daily 11/26/13   Historical Provider, MD  metoprolol succinate (TOPROL-XL) 50 MG 24 hr tablet Take 50 mg by mouth daily.  05/03/15   Historical Provider, MD  Polyethyl Glycol-Propyl Glycol (SYSTANE) 0.4-0.3 % SOLN Apply 1 drop to eye 4 (four) times daily. Reported on 05/13/2015    Historical Provider, MD  pravastatin (PRAVACHOL) 20 MG tablet 1 BY MOUTH DAILY 10/04/13   [name], MD  senna (SENOKOT) 8.6 MG tablet Take 1 tablet (8.6 mg total) by mouth 2 (two) times daily as needed for constipation. 04/23/16   Domenic Polite, MD    Family History Family History  Problem Relation Age of Onset  . Hypertension Father   . Heart attack Father 80  . Stroke Mother     > 77  . Diabetes Sister   . Heart attack Sister 18     her twin  . Diabetes Brother   . Heart attack Brother     in 57s  . Brain cancer Daughter   . Anesthesia problems Neg Hx   . Hypotension Neg Hx   . Malignant hyperthermia Neg Hx   . Pseudochol deficiency Neg Hx     Social History Social History  Substance Use Topics  . Smoking status: Never Smoker  . Smokeless tobacco: Never Used  . Alcohol use No     Allergies   Penicillins   Review of Systems Review of Systems  Respiratory: Negative for shortness of breath.   Cardiovascular: Negative for chest pain.  Gastrointestinal: Negative for vomiting.  Neurological: Positive for weakness. Negative for headaches.  Psychiatric/Behavioral: Negative for confusion.  All other systems reviewed and are negative.    Physical Exam Updated Vital Signs BP 112/58 (BP Location: Left Arm)   Pulse 85   Temp 97.5 F (36.4 C) (Rectal)   Resp 20   SpO2 96%   Physical Exam  Constitutional: She appears well-developed and well-nourished.  HENT:  Head: Normocephalic and atraumatic.  Eyes: Conjunctivae and EOM are normal.  Neck: Normal range of motion.  Cardiovascular: Normal rate and regular rhythm.   Pulmonary/Chest: Effort normal and breath sounds  normal. No stridor. No respiratory distress.  Abdominal: Soft. She exhibits no distension.  Neurological: She is alert. No cranial nerve deficit. Coordination normal.  Skin: Skin is warm and dry.  Nursing note and vitals reviewed.    ED Treatments / Results  Labs (all labs ordered are listed, but only abnormal results are displayed) Labs Reviewed  CBC WITH DIFFERENTIAL/PLATELET - Abnormal; Notable for the following:       Result Value   WBC 28.4 (*)    Hemoglobin 11.1 (*)    Neutro Abs 26.2 (*)    Monocytes Absolute 1.1 (*)    All other components within normal limits  COMPREHENSIVE METABOLIC PANEL - Abnormal; Notable for the following:  Glucose, Bld 135 (*)    BUN 86 (*)    Creatinine, Ser 2.30 (*)    Total Protein 5.0 (*)    Albumin 2.4 (*)    ALT 8 (*)    GFR calc non Af Amer 17 (*)    GFR calc Af Amer 20 (*)    All other components within normal limits  CULTURE, BLOOD (ROUTINE X 2)  CULTURE, BLOOD (ROUTINE X 2)  URINALYSIS, ROUTINE W REFLEX MICROSCOPIC  I-STAT CG4 LACTIC ACID, ED    EKG  EKG Interpretation  Date/Time:  Tuesday May 21 2016 12:46:57 EST Ventricular Rate:  85 PR Interval:    QRS Duration: 90 QT Interval:  384 QTC Calculation: 457 R Axis:   -22 Text Interpretation:  Sinus rhythm Borderline left axis deviation Low voltage, precordial leads Confirmed by [facility] MD, Corene Cornea [phone]) on 05/21/2016 12:54:11 PM       Radiology Dg Chest 2 View  Result Date: 05/21/2016 CLINICAL DATA:  Weakness, shortness of breath. EXAM: CHEST  2 VIEW COMPARISON:  Radiograph of April 18, 2016. FINDINGS: Stable cardiomediastinal silhouette. Atherosclerosis thoracic aorta is noted. No pneumothorax or pleural effusion is noted. No acute pulmonary disease is noted. Bony thorax is unremarkable. IMPRESSION: No active cardiopulmonary disease.  Aortic atherosclerosis. Electronically Signed   By: Marijo Conception, M.D.   On: 05/21/2016 14:05   Ct Head Wo Contrast  Result  Date: 05/21/2016 CLINICAL DATA:  83 year old female with lethargy. No injury. Hypertension. Initial encounter. EXAM: CT HEAD WITHOUT CONTRAST TECHNIQUE: Contiguous axial images were obtained from the base of the skull through the vertex without intravenous contrast. COMPARISON:  04/18/2016 CT.  12/25/2014 MR. FINDINGS: Brain: No intracranial hemorrhage or CT evidence of large acute infarct. Remote left external capsule/ caudate infarct. Remote right lenticular nucleus infarct. Prominent chronic microvascular changes. Moderate global atrophy without hydrocephalus. No intracranial mass lesion noted on this unenhanced exam. Vascular: Vascular calcifications. Skull: Negative. Sinuses/Orbits: Post lens replacement. No acute orbital abnormality. Visualized paranasal sinuses, mastoid air cells and middle ear cavities are clear. Other: Negative IMPRESSION: No acute intracranial abnormality noted. Remote left external capsule/ caudate infarct. Remote right lenticular nucleus infarct. Prominent chronic microvascular changes. Moderate global atrophy. Electronically Signed   By: Genia Del M.D.   On: 05/21/2016 13:41    Procedures Procedures (including critical care time)  Medications Ordered in ED Medications  0.9 %  sodium chloride infusion ( Intravenous New Bag/Given 05/22/16 0513)  mirtazapine (REMERON SOL-TAB) disintegrating tablet 7.5 mg (7.5 mg Oral Given 05/21/16 2324)  aspirin EC tablet 81 mg (not administered)  HYDROcodone-acetaminophen (NORCO/VICODIN) 5-325 MG per tablet 1-2 tablet (not administered)  senna (SENOKOT) tablet 8.6 mg (not administered)  gabapentin (NEURONTIN) capsule 100 mg (100 mg Oral Given 05/21/16 2322)  pravastatin (PRAVACHOL) tablet 20 mg (20 mg Oral Given 05/21/16 2329)  metoprolol succinate (TOPROL-XL) 24 hr tablet 50 mg (not administered)  heparin injection 5,000 Units (5,000 Units Subcutaneous Given 05/22/16 0635)  sodium chloride flush (NS) 0.9 % injection 3 mL (3 mLs  Intravenous Not Given 05/21/16 2324)  sodium chloride flush (NS) 0.9 % injection 3 mL (3 mLs Intravenous Not Given 05/21/16 2117)  sodium chloride flush (NS) 0.9 % injection 3 mL (not administered)  0.9 %  sodium chloride infusion (not administered)  acetaminophen (TYLENOL) tablet 650 mg (not administered)    Or  acetaminophen (TYLENOL) suppository 650 mg (not administered)  ondansetron (ZOFRAN) tablet 4 mg (not administered)    Or  ondansetron (ZOFRAN) injection  4 mg (not administered)  cefTRIAXone (ROCEPHIN) 1 g in dextrose 5 % 50 mL IVPB (not administered)  polyvinyl alcohol (LIQUIFILM TEARS) 1.4 % ophthalmic solution 1 drop (1 drop Both Eyes Given 05/21/16 2344)  sodium chloride 0.9 % bolus 1,000 mL (0 mLs Intravenous Stopped 05/21/16 1450)  sodium chloride 0.9 % bolus 1,000 mL (0 mLs Intravenous Stopped 05/21/16 1617)  cefTRIAXone (ROCEPHIN) 1 g in dextrose 5 % 50 mL IVPB (1 g Intravenous New Bag/Given 05/21/16 1818)     Initial Impression / Assessment and Plan / ED Course  I have reviewed the triage vital signs and the nursing notes.  Pertinent labs & imaging results that were available during my care of the patient were reviewed by me and considered in my medical decision making (see chart for details).  83 yo here with likely dehydration. Also significantly elevated wbc, will eval for infection as well. BUN/Cr up, so will give fluids. Will likely need admission.    After 2 L of fluid and a small urine sample that was purulent in appearance. Suspect urinary tract infection. Started on Rocephin. MS significantly improved from arrival. We'll start more fluids and admit  Final Clinical Impressions(s) / ED Diagnoses   Final diagnoses:  Lower urinary tract infectious disease  Weakness  AKI (acute kidney injury) ([place])  Sepsis, due to unspecified organism [facility])  Uremia     Merrily Pew, MD 05/22/16 [phone]

## 2016-05-22 ENCOUNTER — Ambulatory Visit (INDEPENDENT_AMBULATORY_CARE_PROVIDER_SITE_OTHER): Payer: Medicare Other | Admitting: Orthopedic Surgery

## 2016-05-22 DIAGNOSIS — I10 Essential (primary) hypertension: Secondary | ICD-10-CM

## 2016-05-22 DIAGNOSIS — L8990 Pressure ulcer of unspecified site, unspecified stage: Secondary | ICD-10-CM | POA: Insufficient documentation

## 2016-05-22 DIAGNOSIS — E782 Mixed hyperlipidemia: Secondary | ICD-10-CM

## 2016-05-22 DIAGNOSIS — F0391 Unspecified dementia with behavioral disturbance: Secondary | ICD-10-CM

## 2016-05-22 LAB — BASIC METABOLIC PANEL
ANION GAP: 9 (ref 5–15)
BUN: 64 mg/dL — ABNORMAL HIGH (ref 6–20)
CALCIUM: 8.8 mg/dL — AB (ref 8.9–10.3)
CO2: 23 mmol/L (ref 22–32)
Chloride: 114 mmol/L — ABNORMAL HIGH (ref 101–111)
Creatinine, Ser: 1.27 mg/dL — ABNORMAL HIGH (ref 0.44–1.00)
GFR calc non Af Amer: 36 mL/min — ABNORMAL LOW (ref >60)
GFR, EST AFRICAN AMERICAN: 41 mL/min — AB (ref >60)
Glucose, Bld: 112 mg/dL — ABNORMAL HIGH (ref 65–99)
Potassium: 3.2 mmol/L — ABNORMAL LOW (ref 3.5–5.1)
SODIUM: 146 mmol/L — AB (ref 135–145)

## 2016-05-22 LAB — CBC
HEMATOCRIT: 34.7 % — AB (ref 36.0–46.0)
Hemoglobin: 10.5 g/dL — ABNORMAL LOW (ref 12.0–15.0)
MCH: 26.6 pg (ref 26.0–34.0)
MCHC: 30.3 g/dL (ref 30.0–36.0)
MCV: 87.8 fL (ref 78.0–100.0)
PLATELETS: 287 10*3/uL (ref 150–400)
RBC: 3.95 MIL/uL (ref 3.87–5.11)
RDW: 15.5 % (ref 11.5–15.5)
WBC: 20.2 10*3/uL — AB (ref 4.0–10.5)

## 2016-05-22 LAB — GLUCOSE, CAPILLARY: Glucose-Capillary: 82 mg/dL (ref 65–99)

## 2016-05-22 MED ORDER — BOOST / RESOURCE BREEZE PO LIQD
1.0000 | Freq: Three times a day (TID) | ORAL | Status: DC
  Administered 2016-05-22 – 2016-05-28 (×16): 1 via ORAL

## 2016-05-22 MED ORDER — HYDRALAZINE HCL 20 MG/ML IJ SOLN: 10.0000 mg | INTRAMUSCULAR | Status: DC | PRN

## 2016-05-22 MED ORDER — DEXTROSE-NACL 5-0.45 % IV SOLN
INTRAVENOUS | Status: DC
  Administered 2016-05-22 – 2016-05-23 (×2): via INTRAVENOUS

## 2016-05-22 NOTE — Progress Notes (Addendum)
Initial Nutrition Assessment  DOCUMENTATION CODES:   Not applicable  INTERVENTION:    Boost Breeze po TID, each supplement provides 250 kcal and 9 grams of protein  NUTRITION DIAGNOSIS:   Increased nutrient needs related to acute illness, wound healing as evidenced by estimated needs  GOAL:   Patient will meet greater than or equal to 90% of their needs  MONITOR:   PO intake, Supplement acceptance, Labs, Weight trends, Skin, I & O's  REASON FOR ASSESSMENT:   Low Braden  ASSESSMENT:   83 yo Female admitted 05/11/16 from [facility] SNF with sepsis due to UTI, encephalopathy, AKI.  PMH:  s/p Rt hemiarthroplasty 04/19/17, dislocation and revision 04/20/17;  dementia, HTN, HLD, CVA, anxiety.  Pt minimally responsive upon RD visit. No % PO intake available per flowsheets at this time. Low braden score places pt at risk for further skin breakdown. Will order oral nutrition supplements during hospital stay. CBG 84.  RD unable to complete Nutrition Focused Physical Exam at this time.   Diet Order:  Diet clear liquid Room service appropriate? Yes; Fluid consistency: Thin  Skin:   deep tissue bilateral heel, Stage I to sacrum  Last BM:  1/30  Height:   Ht Readings from Last 1 Encounters:  05/22/16 5\' 1"  (1.549 m)    Weight:   Wt Readings from Last 1 Encounters:  05/22/16 115 lb (52.2 kg)    Ideal Body Weight:  47.7 kg  BMI:  Body mass index is 21.73 kg/m.  Estimated Nutritional Needs:   Kcal:  1200-1400  Protein:  60-70 gm  Fluid:  >/= 1.5 L  EDUCATION NEEDS:   No education needs identified at this time  Arthur Holms, RD, LDN Pager #: [phone] After-Hours Pager #: [phone]

## 2016-05-22 NOTE — Evaluation (Signed)
Physical Therapy Evaluation Patient Details Name: Rebecca Knight MRN: [id number] DOB: Oct 08, 1922 Today's Date: 05/22/2016   History of Present Illness  Patient is a 83 yo female admitted 05/11/16 from [facility] SNF with sepsis due to UTI, encephalopathy, AKI.    PMH:  s/p Rt hemiarthroplasty 04/19/17, dislocation and revision 04/20/17;  dementia, HTN, HLD, CVA, anxiety  Clinical Impression  Patient presents with problems listed below.  Will benefit from acute PT to maximize functional mobility prior to d/c back to SNF.  During last admit for Rt hip arthroplasty, patient had posterior hip precautions and was WBAT.  Recommend patient return to SNF to continue her rehabilitation at d/c.    Follow Up Recommendations SNF;Supervision/Assistance - 24 hour    Equipment Recommendations  None recommended by PT    Recommendations for Other Services       Precautions / Restrictions Precautions Precautions: Posterior Hip;Fall (On post hip precautions following surgery 04/19/29) Restrictions Weight Bearing Restrictions: No Other Position/Activity Restrictions: WBAT RLE following hip surgery      Mobility  Bed Mobility Overal bed mobility: Needs Assistance Bed Mobility: Supine to Sit;Sit to Supine     Supine to sit: Max assist;HOB elevated Sit to supine: Max assist;HOB elevated   General bed mobility comments: Verbal and tactile cues to move to EOB.  Patient required assist to initiate movement.  Max assist to move LE's and trunk off of and onto bed.  Once sitting, patient able to maintain sitting balance with min guard assist.  Sat EOB x 4 minutes and then moving to supine.  Transfers                 General transfer comment: NT  Ambulation/Gait                Stairs            Wheelchair Mobility    Modified Rankin (Stroke Patients Only)       Balance Overall balance assessment: History of Falls;Needs assistance Sitting-balance support: Single extremity  supported;Feet supported Sitting balance-Leahy Scale: Fair                                       Pertinent Vitals/Pain Pain Assessment: Faces Faces Pain Scale: Hurts a little bit Pain Location: Unsure Pain Descriptors / Indicators: Grimacing Pain Intervention(s): Monitored during session;Repositioned    Home Living Family/patient expects to be discharged to:: Skilled nursing facility                 Additional Comments: Patient from [facility] per chart    Prior Function Level of Independence: Needs assistance         Comments: Assume assist for mobility and ADL's at SNF.  Patient unable to provide information.     Hand Dominance   Dominant Hand: Right    Extremity/Trunk Assessment   Upper Extremity Assessment Upper Extremity Assessment: Generalized weakness    Lower Extremity Assessment Lower Extremity Assessment: Generalized weakness;RLE deficits/detail;Difficult to assess due to impaired cognition RLE Deficits / Details: Decreased strength.  Knee extension 3-/5 at EOB.         Communication   Communication: No difficulties  Cognition Arousal/Alertness: Lethargic Behavior During Therapy: Anxious;Flat affect Overall Cognitive Status: No family/caregiver present to determine baseline cognitive functioning                 General Comments: Oriented x 1.  General Comments      Exercises General Exercises - Lower Extremity Ankle Circles/Pumps: AROM;Both;10 reps;Supine   Assessment/Plan    PT Assessment Patient needs continued PT services  PT Problem List Decreased strength;Decreased activity tolerance;Decreased balance;Decreased mobility;Decreased cognition;Decreased knowledge of use of DME;Decreased safety awareness;Decreased knowledge of precautions;Pain          PT Treatment Interventions DME instruction;Gait training;Functional mobility training;Therapeutic activities;Therapeutic exercise;Balance  training;Patient/family education    PT Goals (Current goals can be found in the Care Plan section)  Acute Rehab PT Goals Patient Stated Goal: None stated PT Goal Formulation: Patient unable to participate in goal setting Time For Goal Achievement: 06/05/16 Potential to Achieve Goals: Fair    Frequency Min 3X/week   Barriers to discharge        Co-evaluation               End of Session   Activity Tolerance: Patient limited by fatigue;Patient limited by lethargy Patient left: in bed;with call bell/phone within reach;with bed alarm set Nurse Communication: Mobility status;Precautions;Weight bearing status         Time: [id number] PT Time Calculation (min) (ACUTE ONLY): 11 min   Charges:   PT Evaluation $PT Eval Moderate Complexity: 1 Procedure     PT G Codes:        Despina Pole 06/01/16, 11:36 AM Carita Pian. Sanjuana Kava, [place] Pager [phone]

## 2016-05-22 NOTE — Progress Notes (Signed)
PROGRESS NOTE    Rebecca Knight  [id number] DOB: 1923-02-26 DOA: 05/21/2016 PCP: [name], DO   Brief Narrative:  83 yo female with dementia and history of cva, from SNF, presents with progressive worsening mentation for several days, associated with poor oral intake. Recent hospitalization on 12/29 for hip hemiarthroplasty. Noted dehydrated with leukocytosis. Transferred to to hospital for further management.    Assessment & Plan:   Principal Problem:   Sepsis ([place]) Active Problems:   HYPERLIPIDEMIA   Essential hypertension   Acute encephalopathy   UTI (urinary tract infection)   Acute kidney injury ([place])   Dehydration, severe   History of right hip hemiarthroplasty 04/19/16   Dementia   Complicated UTI (urinary tract infection)   Pressure injury of skin   1. Sepsis due to urine infection, present on admission. Will continue antibiotic therapy with ceftriaxone, cultures no growth, cell count 20 from 28. Will decrease IV fluids to 75 cc/hr, follow a restrictive IV fluid strategy, to prevent volume overload.   2. Acute metabolic encephalopathy. Patient awake and alert, follows commands, positive confusion, no agitation, will continue mirtazapine and physical therapy evaluation.   3. HTN. Blood pressure stable, systolic [id number] to [id number], at home on metoprolol 50 mg xl daily, tolerating well.   4. AKI due to dehydration. Renal function with cr dow to 1,27 from 2,30, will continue IV fluids with hypotonic solution to prevent worsening hypernatremia and hyperchloremia. Avoid hypotension and nephrotoxic medications.   5. Dyslipidemia. Continue statin therapy.    6. Dementia. Positive for confusion and disorientation, no agitation.       DVT prophylaxis:  Heparin  Code Status: full  Family Communication: no family at the bedside.  Disposition Plan: home   Consultants:     Procedures:    Antimicrobials:   Ceftriaxone    Subjective: Patient somnolent, most  information from nursing at the bedside, no nausea or vomiting, but decreased po intake. No fever or chills.   Objective: Vitals:   05/22/16 0627 05/22/16 0915 05/22/16 0934 05/22/16 1032  BP: (!) 145/79 (!) 161/76    Pulse: 100   92  Resp: 18     Temp: 99.2 F (37.3 C)     TempSrc: Oral     SpO2: 91%  100%    No intake or output data in the 24 hours ending 05/22/16 1103 There were no vitals filed for this visit.  Examination:  General exam: patient very somnolent E ENT: mild pallor, no icterus, oral mucosa dry.  Respiratory system: Decreased breath sounds at bases due to poor inspiratory effort. Respiratory effort normal. Cardiovascular system: S1 & S2 heard, RRR. No JVD, murmurs, rubs, gallops or clicks. No pedal edema. Gastrointestinal system: Abdomen is nondistended, soft and nontender. No organomegaly or masses felt. Normal bowel sounds heard. Central nervous system: Alert and oriented. No focal neurological deficits. Extremities: follows commands, somnolent. Non focal.  Skin: No rashes, lesions or ulcers.   Data Reviewed: I have personally reviewed following labs and imaging studies  CBC:  Recent Labs Lab 05/21/16 1317 05/22/16 0758  WBC 28.4* 20.2*  NEUTROABS 26.2*  --   HGB 11.1* 10.5*  HCT 36.4 34.7*  MCV 85.6 87.8  PLT 303 [id number]   Basic Metabolic Panel:  Recent Labs Lab 05/21/16 1317 05/22/16 0758  NA 144 146*  K 3.6 3.2*  CL 105 114*  CO2 26 23  GLUCOSE 135* 112*  BUN 86* 64*  CREATININE 2.30* 1.27*  CALCIUM 9.3 8.8*  GFR: CrCl cannot be calculated (Unknown ideal weight.). Liver Function Tests:  Recent Labs Lab 05/21/16 1317  AST 16  ALT 8*  ALKPHOS 94  BILITOT 0.7  PROT 5.0*  ALBUMIN 2.4*   No results for input(s): LIPASE, AMYLASE in the last 168 hours. No results for input(s): AMMONIA in the last 168 hours. Coagulation Profile:  Recent Labs Lab 05/21/16 1705  INR 1.36   Cardiac Enzymes: No results for input(s): CKTOTAL,  CKMB, CKMBINDEX, TROPONINI in the last 168 hours. BNP (last 3 results) No results for input(s): PROBNP in the last 8760 hours. HbA1C: No results for input(s): HGBA1C in the last 72 hours. CBG:  Recent Labs Lab 05/22/16 0919  GLUCAP 82   Lipid Profile: No results for input(s): CHOL, HDL, LDLCALC, TRIG, CHOLHDL, LDLDIRECT in the last 72 hours. Thyroid Function Tests: No results for input(s): TSH, T4TOTAL, FREET4, T3FREE, THYROIDAB in the last 72 hours. Anemia Panel: No results for input(s): VITAMINB12, FOLATE, FERRITIN, TIBC, IRON, RETICCTPCT in the last 72 hours. Sepsis Labs:  Recent Labs Lab 05/21/16 1327 05/21/16 1651 05/21/16 1705 05/21/16 1744 05/21/16 2238  PROCALCITON  --   --  0.52  --   --   LATICACIDVEN 1.58 2.84*  --  2.1* 1.4    No results found for this or any previous visit (from the past 240 hour(s)).       Radiology Studies: Dg Chest 2 View  Result Date: 05/21/2016 CLINICAL DATA:  Weakness, shortness of breath. EXAM: CHEST  2 VIEW COMPARISON:  Radiograph of April 18, 2016. FINDINGS: Stable cardiomediastinal silhouette. Atherosclerosis thoracic aorta is noted. No pneumothorax or pleural effusion is noted. No acute pulmonary disease is noted. Bony thorax is unremarkable. IMPRESSION: No active cardiopulmonary disease.  Aortic atherosclerosis. Electronically Signed   By: Marijo Conception, M.D.   On: 05/21/2016 14:05   Ct Head Wo Contrast  Result Date: 05/21/2016 CLINICAL DATA:  83 year old female with lethargy. No injury. Hypertension. Initial encounter. EXAM: CT HEAD WITHOUT CONTRAST TECHNIQUE: Contiguous axial images were obtained from the base of the skull through the vertex without intravenous contrast. COMPARISON:  04/18/2016 CT.  12/25/2014 MR. FINDINGS: Brain: No intracranial hemorrhage or CT evidence of large acute infarct. Remote left external capsule/ caudate infarct. Remote right lenticular nucleus infarct. Prominent chronic microvascular changes.  Moderate global atrophy without hydrocephalus. No intracranial mass lesion noted on this unenhanced exam. Vascular: Vascular calcifications. Skull: Negative. Sinuses/Orbits: Post lens replacement. No acute orbital abnormality. Visualized paranasal sinuses, mastoid air cells and middle ear cavities are clear. Other: Negative IMPRESSION: No acute intracranial abnormality noted. Remote left external capsule/ caudate infarct. Remote right lenticular nucleus infarct. Prominent chronic microvascular changes. Moderate global atrophy. Electronically Signed   By: Genia Del M.D.   On: 05/21/2016 13:41        Scheduled Meds: . aspirin EC  81 mg Oral Daily  . cefTRIAXone (ROCEPHIN)  IV  1 g Intravenous Q24H  . gabapentin  100 mg Oral BID  . heparin  5,000 Units Subcutaneous Q8H  . metoprolol succinate  50 mg Oral Daily  . mirtazapine  7.5 mg Oral QHS  . polyvinyl alcohol  1 drop Both Eyes QID  . pravastatin  20 mg Oral q1800  . sodium chloride flush  3 mL Intravenous Q12H  . sodium chloride flush  3 mL Intravenous Q12H   Continuous Infusions: . sodium chloride 100 mL/hr at 05/22/16 0513     LOS: 1 day       Makiya Jeune  Gerome Apley, MD [facility] Pager [phone]  If 7PM-7AM, please contact night-coverage [email or web address] Password [email or web address] 05/22/2016, 11:03 AM

## 2016-05-22 NOTE — Progress Notes (Addendum)
While performing mouth care, RN noted what appears to be a large, right upper jaw dental abscess. Patient denies pain or discomfort. Mid level on call notified via text page.

## 2016-05-23 LAB — CBC WITH DIFFERENTIAL/PLATELET
BASOS ABS: 0 10*3/uL (ref 0.0–0.1)
BASOS PCT: 0 %
Eosinophils Absolute: 0 10*3/uL (ref 0.0–0.7)
Eosinophils Relative: 0 %
HEMATOCRIT: 35.4 % — AB (ref 36.0–46.0)
HEMOGLOBIN: 10.6 g/dL — AB (ref 12.0–15.0)
LYMPHS PCT: 9 %
Lymphs Abs: 0.9 10*3/uL (ref 0.7–4.0)
MCH: 26.2 pg (ref 26.0–34.0)
MCHC: 29.9 g/dL — ABNORMAL LOW (ref 30.0–36.0)
MCV: 87.4 fL (ref 78.0–100.0)
Monocytes Absolute: 0.4 10*3/uL (ref 0.1–1.0)
Monocytes Relative: 4 %
NEUTROS ABS: 8.4 10*3/uL — AB (ref 1.7–7.7)
Neutrophils Relative %: 87 %
Platelets: 222 10*3/uL (ref 150–400)
RBC: 4.05 MIL/uL (ref 3.87–5.11)
RDW: 15.1 % (ref 11.5–15.5)
WBC: 9.7 10*3/uL (ref 4.0–10.5)

## 2016-05-23 LAB — BASIC METABOLIC PANEL
ANION GAP: 7 (ref 5–15)
BUN: 44 mg/dL — ABNORMAL HIGH (ref 6–20)
CALCIUM: 9.2 mg/dL (ref 8.9–10.3)
CHLORIDE: 119 mmol/L — AB (ref 101–111)
CO2: 22 mmol/L (ref 22–32)
Creatinine, Ser: 0.9 mg/dL (ref 0.44–1.00)
GFR calc non Af Amer: 54 mL/min — ABNORMAL LOW (ref >60)
GLUCOSE: 139 mg/dL — AB (ref 65–99)
POTASSIUM: 3.2 mmol/L — AB (ref 3.5–5.1)
Sodium: 148 mmol/L — ABNORMAL HIGH (ref 135–145)

## 2016-05-23 LAB — URINE CULTURE

## 2016-05-23 MED ORDER — DEXTROSE 5 % IV SOLN
INTRAVENOUS | Status: DC
  Administered 2016-05-23 – 2016-05-25 (×3): via INTRAVENOUS
  Administered 2016-05-27: 500 mL via INTRAVENOUS
  Administered 2016-05-28: 06:00:00 via INTRAVENOUS

## 2016-05-23 NOTE — NC FL2 (Signed)
[place] LEVEL OF CARE SCREENING TOOL     IDENTIFICATION  Patient Name: Rebecca Knight Birthdate: 1923/01/04 Sex: female Admission Date (Current Location): 05/21/2016  [facility] and [place] Number:  Herbalist and Address:  [facility], [place] [address] [place],  [address]      Provider Number: [id number]  Attending Physician Name and Address:  Domenic Polite, MD  Relative Name and Phone Number:  Truman Hayward, son, [phone]    Current Level of Care: Hospital Recommended Level of Care: [place] Prior Approval Number:    Date Approved/Denied:   PASRR Number: [id number]  Discharge Plan: SNF    Current Diagnoses: Patient Active Problem List   Diagnosis Date Noted  . Pressure injury of skin 05/22/2016  . Sepsis ([place]) 05/21/2016  . UTI (urinary tract infection) 05/21/2016  . Acute kidney injury ([place]) 05/21/2016  . Dehydration, severe 05/21/2016  . History of right hip hemiarthroplasty 04/19/16 05/21/2016  . Dementia 05/21/2016  . Complicated UTI (urinary tract infection)   . Displaced intertrochanteric fracture of right femur, initial encounter for closed fracture ([place]) 04/30/2016  . Displaced fracture of greater trochanter of right femur, initial encounter for closed fracture ([place])   . Closed displaced fracture of right femoral neck ([place]) 04/18/2016  . Confusion   . Stroke ([place])   . Syncope and collapse   . Cerebral thrombosis with cerebral infarction ([place]) 12/25/2014  . Acute encephalopathy 12/23/2014  . Urinary incontinence 02/10/2012  . Abdominal aneurysm without mention of rupture 04/13/2010  . CHOLELITHIASIS 04/13/2010  . NONSPEC ELEVATION OF LEVELS OF TRANSAMINASE/LDH 04/13/2010  . DIVERTICULOSIS, COLON 06/14/2008  . COLON CANCER, HX OF 06/14/2008  . THYROID NODULE, RIGHT 01/26/2007  . HYPERLIPIDEMIA 01/26/2007  . OSTEOPOROSIS NOS 01/26/2007  . POSTHERPETIC NEURALGIA 11/20/2006  . Essential  hypertension 11/20/2006    Orientation RESPIRATION BLADDER Height & Weight     Self  Normal Incontinent Weight: 52.2 kg (115 lb) Height:  5\' 1"  (154.9 cm)  BEHAVIORAL SYMPTOMS/MOOD NEUROLOGICAL BOWEL NUTRITION STATUS      Continent Diet (Please see DC Summary)  AMBULATORY STATUS COMMUNICATION OF NEEDS Skin   Extensive Assist Verbally PU Stage and Appropriate Care (Pressure injury Stage I on sacrum; wound on heel;)                       Personal Care Assistance Level of Assistance  Bathing, Feeding, Dressing Bathing Assistance: Maximum assistance Feeding assistance: Limited assistance Dressing Assistance: Maximum assistance     Functional Limitations Info             SPECIAL CARE FACTORS FREQUENCY  PT (By licensed PT)     PT Frequency: 5x/week              Contractures      Additional Factors Info  Code Status, Allergies Code Status Info: Full Allergies Info: Penicillins           Current Medications (05/23/2016):  This is the current hospital active medication list Current Facility-Administered Medications  Medication Dose Route Frequency Provider Last Rate Last Dose  . 0.9 %  sodium chloride infusion  250 mL Intravenous PRN Waldemar Dickens, MD      . acetaminophen (TYLENOL) tablet 650 mg  650 mg Oral Q6H PRN Waldemar Dickens, MD       Or  . acetaminophen (TYLENOL) suppository 650 mg  650 mg Rectal Q6H PRN Waldemar Dickens, MD      .  aspirin EC tablet 81 mg  81 mg Oral Daily Waldemar Dickens, MD   81 mg at 05/23/16 0813  . cefTRIAXone (ROCEPHIN) 1 g in dextrose 5 % 50 mL IVPB  1 g Intravenous Q24H Otilio Miu, RPH   1 g at 05/22/16 1758  . dextrose 5 %-0.45 % sodium chloride infusion   Intravenous Continuous Tawni Millers, MD 75 mL/hr at 05/23/16 [id number]    . feeding supplement (BOOST / RESOURCE BREEZE) liquid 1 Container  1 Container Oral TID BM Mauricio Gerome Apley, MD   1 Container at 05/22/16 1412  . gabapentin (NEURONTIN) capsule 100 mg   100 mg Oral BID Waldemar Dickens, MD   100 mg at 05/23/16 0815  . heparin injection 5,000 Units  5,000 Units Subcutaneous Q8H Waldemar Dickens, MD   5,000 Units at 05/23/16 [id number]  . hydrALAZINE (APRESOLINE) injection 10 mg  10 mg Intravenous Q4H PRN Tawni Millers, MD      . HYDROcodone-acetaminophen (NORCO/VICODIN) 5-325 MG per tablet 1-2 tablet  1-2 tablet Oral Q6H PRN Waldemar Dickens, MD      . metoprolol succinate (TOPROL-XL) 24 hr tablet 50 mg  50 mg Oral Daily Waldemar Dickens, MD   50 mg at 05/23/16 0813  . mirtazapine (REMERON SOL-TAB) disintegrating tablet 7.5 mg  7.5 mg Oral QHS Waldemar Dickens, MD   7.5 mg at 05/22/16 2214  . ondansetron (ZOFRAN) tablet 4 mg  4 mg Oral Q6H PRN Waldemar Dickens, MD       Or  . ondansetron [facility]) injection 4 mg  4 mg Intravenous Q6H PRN Waldemar Dickens, MD      . polyvinyl alcohol (LIQUIFILM TEARS) 1.4 % ophthalmic solution 1 drop  1 drop Both Eyes QID Waldemar Dickens, MD   1 drop at 05/23/16 [phone]  . pravastatin (PRAVACHOL) tablet 20 mg  20 mg Oral q1800 Waldemar Dickens, MD   20 mg at 05/22/16 1758  . senna (SENOKOT) tablet 8.6 mg  1 tablet Oral BID PRN Waldemar Dickens, MD      . sodium chloride flush (NS) 0.9 % injection 3 mL  3 mL Intravenous Q12H Waldemar Dickens, MD      . sodium chloride flush (NS) 0.9 % injection 3 mL  3 mL Intravenous Q12H Waldemar Dickens, MD      . sodium chloride flush (NS) 0.9 % injection 3 mL  3 mL Intravenous PRN Waldemar Dickens, MD         Discharge Medications: Please see discharge summary for a list of discharge medications.  Relevant Imaging Results:  Relevant Lab Results:   Additional Information SSN: [id number]  Benard Halsted, [place]

## 2016-05-23 NOTE — Progress Notes (Signed)
PROGRESS NOTE    Rebecca Knight  [id number] DOB: 1922/11/26 DOA: 05/21/2016 PCP: [name], DO   Brief Narrative:  83 yo female with dementia and history of cva, from SNF, presents with progressive worsening mentation for several days, associated with poor oral intake. Recent hospitalization on 12/29 for hip hemiarthroplasty. Noted dehydrated with leukocytosis. Transferred to to hospital for further management.  UA abnormal, on Abx for UTI, improving Na climbling on NS  Assessment & Plan:   1. Sepsis due to urine infection, present on admission. -UA-turbid, many bacteria, numerous WBC, -culture-polymicrobial -continue ceftriaxone while inpatient and change to Keflex at discharge to SNF for few more days -continue IVF today   2. Acute metabolic encephalopathy -mentation improved - follows commands, positive confusion, no agitation, will continue mirtazapine -NA climbing stop IVF and change to d5W  3. Hypernatremia -Na 144-> 146-> 148, will change to d5W , encouraged water  4. HTN. B -stable,  metoprolol  4. AKI due to dehydration -creatinine improving to 1.2 from 2.3 on admission but now Na rising and hence will change IVF to d5W  5. Dyslipidemia. Continue statin therapy.    6. Dementia -with some confusion and disorientation, no agitation.    -stable  DVT prophylaxis:  Heparin SQ Code Status: full  Family Communication: no family at the bedside.  Disposition Plan: SNF tomorrow if Na improved  Consultants:     Procedures:    Antimicrobials:   Ceftriaxone    Subjective: Sleepy, wakes easily, no distress  Objective: Vitals:   05/22/16 1159 05/22/16 1441 05/22/16 2223 05/23/16 0555  BP:  138/78 (!) 178/87 (!) 171/76  Pulse:  86 (!) 26 76  Resp:  18 16 16   Temp:  97.6 F (36.4 C) 98.7 F (37.1 C) 98.6 F (37 C)  TempSrc:      SpO2:  95% 94% 99%  Weight: 52.2 kg (115 lb)     Height: 5\' 1"  (1.549 m)       Intake/Output Summary (Last 24 hours)  at 05/23/16 1431 Last data filed at 05/23/16 0608  Gross per 24 hour  Intake             1405 ml  Output                0 ml  Net             1405 ml   Filed Weights   05/22/16 1159  Weight: 52.2 kg (115 lb)    Examination:  General exam: sleepy, arousable, easily arousable, no idstress, confused E ENT: mild pallor, no icterus, oral mucosa dry.  Respiratory system: Decreased breath sounds at bases Cardiovascular system: S1 & S2 heard, RRR. No JVD, murmurs Gastrointestinal system: Abdomen is nondistended, soft and nontender.Normal bowel sounds heard. Central nervous system: Alert and oriented. No focal neurological deficits. Extremities: follows commands, somnolent. Non focal.  Skin: No rashes, lesions or ulcers.   Data Reviewed: I have personally reviewed following labs and imaging studies  CBC:  Recent Labs Lab 05/21/16 1317 05/22/16 0758 05/23/16 0443  WBC 28.4* 20.2* 9.7  NEUTROABS 26.2*  --  8.4*  HGB 11.1* 10.5* 10.6*  HCT 36.4 34.7* 35.4*  MCV 85.6 87.8 87.4  PLT 303 287 [id number]   Basic Metabolic Panel:  Recent Labs Lab 05/21/16 1317 05/22/16 0758 05/23/16 0443  NA 144 146* 148*  K 3.6 3.2* 3.2*  CL 105 114* 119*  CO2 26 23 22   GLUCOSE 135* 112* 139*  BUN  86* 64* 44*  CREATININE 2.30* 1.27* 0.90  CALCIUM 9.3 8.8* 9.2   GFR: Estimated Creatinine Clearance: 30.7 mL/min (by C-G formula based on SCr of 0.9 mg/dL). Liver Function Tests:  Recent Labs Lab 05/21/16 1317  AST 16  ALT 8*  ALKPHOS 94  BILITOT 0.7  PROT 5.0*  ALBUMIN 2.4*   No results for input(s): LIPASE, AMYLASE in the last 168 hours. No results for input(s): AMMONIA in the last 168 hours. Coagulation Profile:  Recent Labs Lab 05/21/16 1705  INR 1.36   Cardiac Enzymes: No results for input(s): CKTOTAL, CKMB, CKMBINDEX, TROPONINI in the last 168 hours. BNP (last 3 results) No results for input(s): PROBNP in the last 8760 hours. HbA1C: No results for input(s): HGBA1C in the  last 72 hours. CBG:  Recent Labs Lab 05/22/16 0919  GLUCAP 82   Lipid Profile: No results for input(s): CHOL, HDL, LDLCALC, TRIG, CHOLHDL, LDLDIRECT in the last 72 hours. Thyroid Function Tests: No results for input(s): TSH, T4TOTAL, FREET4, T3FREE, THYROIDAB in the last 72 hours. Anemia Panel: No results for input(s): VITAMINB12, FOLATE, FERRITIN, TIBC, IRON, RETICCTPCT in the last 72 hours. Sepsis Labs:  Recent Labs Lab 05/21/16 1327 05/21/16 1651 05/21/16 1705 05/21/16 1744 05/21/16 2238  PROCALCITON  --   --  0.52  --   --   LATICACIDVEN 1.58 2.84*  --  2.1* 1.4    Recent Results (from the past 240 hour(s))  Blood culture (routine x 2)     Status: None (Preliminary result)   Collection Time: 05/21/16  1:17 PM  Result Value Ref Range Status   Specimen Description BLOOD LEFT FOREARM  Final   Special Requests BOTTLES DRAWN AEROBIC AND ANAEROBIC 5CC  Final   Culture NO GROWTH < 24 HOURS  Final   Report Status PENDING  Incomplete  Blood culture (routine x 2)     Status: None (Preliminary result)   Collection Time: 05/21/16  2:36 PM  Result Value Ref Range Status   Specimen Description BLOOD RIGHT ANTECUBITAL  Final   Special Requests BOTTLES DRAWN AEROBIC ONLY 10CC  Final   Culture NO GROWTH < 24 HOURS  Final   Report Status PENDING  Incomplete  Urine culture     Status: Abnormal   Collection Time: 05/21/16  4:25 PM  Result Value Ref Range Status   Specimen Description URINE, CATHETERIZED  Final   Special Requests NONE  Final   Culture MULTIPLE SPECIES PRESENT, SUGGEST RECOLLECTION (A)  Final   Report Status 05/23/2016 FINAL  Final         Radiology Studies: No results found.      Scheduled Meds: . aspirin EC  81 mg Oral Daily  . cefTRIAXone (ROCEPHIN)  IV  1 g Intravenous Q24H  . feeding supplement  1 Container Oral TID BM  . gabapentin  100 mg Oral BID  . heparin  5,000 Units Subcutaneous Q8H  . metoprolol succinate  50 mg Oral Daily  .  mirtazapine  7.5 mg Oral QHS  . polyvinyl alcohol  1 drop Both Eyes QID  . pravastatin  20 mg Oral q1800  . sodium chloride flush  3 mL Intravenous Q12H  . sodium chloride flush  3 mL Intravenous Q12H   Continuous Infusions: . dextrose 50 mL/hr at 05/23/16 1054     LOS: 2 days       Domenic Polite, MD [facility] Pager [phone] If 7PM-7AM, please contact night-coverage [email or web address] Password [facility] 05/23/2016, 2:31 PM

## 2016-05-23 NOTE — Clinical Social Work Note (Signed)
Clinical Social Work Assessment  Patient Details  Name: Rebecca Knight MRN: [id number] Date of Birth: February 26, 1923  Date of referral:  05/23/16               Reason for consult:  Discharge Planning                Permission sought to share information with:  Facility Sport and exercise psychologist, Family Supports Permission granted to share information::  No  Name::     Agricultural consultant::  [facility]  Relationship::  Son  Contact Information:  [phone]  Housing/Transportation Living arrangements for the past 2 months:  [place], [place] of Information:  Adult Children Patient Interpreter Needed:  None Criminal Activity/Legal Involvement Pertinent to Current Situation/Hospitalization:  No - Comment as needed Significant Relationships:  Adult Children Lives with:  Facility Resident, Self Do you feel safe going back to the place where you live?  Yes Need for family participation in patient care:  Yes (Comment)  Care giving concerns:  CSW received consult regarding discharge planning. Patient is disoriented. CSW spoke with patient's son. Patient son reported that patient came to hospital from [facility] and he would like her to return at discharge. CSW to continue to follow and assist with discharge planning needs.   Social Worker assessment / plan:  CSW spoke with patient's son concerning possibility of returning to rehab at [facility] before returning home.  Employment status:  Retired Nurse, adult PT Recommendations:  [place] / Referral to community resources:  [place]  Patient/Family's Response to care:  Patient's son reported that he did not do a bed hold at [facility], but would like for patient to return there, pending insurance auth.  Patient/Family's Understanding of and Emotional Response to Diagnosis, Current Treatment, and Prognosis:  Patient/family is realistic regarding  therapy needs and expressed being hopeful for SNF placement. Patient's son expressed understanding of CSW role and discharge process. No questions/concerns about plan or treatment.    Emotional Assessment Appearance:  Appears stated age Attitude/Demeanor/Rapport:  Unable to Assess Affect (typically observed):  Unable to Assess Orientation:  Oriented to Self Alcohol / Substance use:  Not Applicable Psych involvement (Current and /or in the community):  No (Comment)  Discharge Needs  Concerns to be addressed:  Care Coordination Readmission within the last 30 days:  No Current discharge risk:  None Barriers to Discharge:  Continued Medical Work up   [employer], [place] 05/23/2016, 9:17 AM

## 2016-05-24 ENCOUNTER — Encounter (HOSPITAL_COMMUNITY): Payer: Self-pay | Admitting: Surgery

## 2016-05-24 DIAGNOSIS — E870 Hyperosmolality and hypernatremia: Secondary | ICD-10-CM

## 2016-05-24 DIAGNOSIS — E876 Hypokalemia: Secondary | ICD-10-CM

## 2016-05-24 LAB — CBC
HCT: 34.2 % — ABNORMAL LOW (ref 36.0–46.0)
HEMOGLOBIN: 10.3 g/dL — AB (ref 12.0–15.0)
MCH: 26.1 pg (ref 26.0–34.0)
MCHC: 30.1 g/dL (ref 30.0–36.0)
MCV: 86.6 fL (ref 78.0–100.0)
Platelets: 231 10*3/uL (ref 150–400)
RBC: 3.95 MIL/uL (ref 3.87–5.11)
RDW: 15 % (ref 11.5–15.5)
WBC: 9.7 10*3/uL (ref 4.0–10.5)

## 2016-05-24 LAB — BASIC METABOLIC PANEL
ANION GAP: 6 (ref 5–15)
BUN: 25 mg/dL — AB (ref 6–20)
CALCIUM: 8.9 mg/dL (ref 8.9–10.3)
CO2: 24 mmol/L (ref 22–32)
CREATININE: 0.77 mg/dL (ref 0.44–1.00)
Chloride: 114 mmol/L — ABNORMAL HIGH (ref 101–111)
GFR calc Af Amer: >60 mL/min (ref >60)
GLUCOSE: 125 mg/dL — AB (ref 65–99)
Potassium: 3 mmol/L — ABNORMAL LOW (ref 3.5–5.1)
Sodium: 144 mmol/L (ref 135–145)

## 2016-05-24 LAB — MAGNESIUM: MAGNESIUM: 1.8 mg/dL (ref 1.7–2.4)

## 2016-05-24 MED ORDER — POTASSIUM CHLORIDE CRYS ER 20 MEQ PO TBCR
40.0000 meq | EXTENDED_RELEASE_TABLET | ORAL | Status: AC
  Administered 2016-05-24: 40 meq via ORAL
  Filled 2016-05-24 (×2): qty 2

## 2016-05-24 NOTE — Clinical Social Work Note (Addendum)
Clinicals have been faxed to [facility] for review for insurance authorization to SNF. [facility] is requesting more information. Awaiting call back from acute rehab to ensure patient is on the list to be seen by PT today. They are requesting more information on ambulation, sit to stand, and transfers.  Dayton Scrape, [place] [phone]  2:20 pm Patient is next on physical therapist's list. CSW made her aware of insurance company's information request.  Dayton Scrape, [place]  4:03 pm Additional requested information faxed to [facility] for review.  Dayton Scrape, [place]

## 2016-05-24 NOTE — Care Management Note (Signed)
Case Management Note  Patient Details  Name: Rebecca Knight MRN: [id number] Date of Birth: 14-Jul-1922  Subjective/Objective:     Admitted with AMS / sepsis,   history of CVA and dementia. From [facility] SNF.          Wilhelmenia Blase III (Son) Bertram Millard Royce Macadamia    [phone]      PCP: [name]  Action/Plan: Return to SNF when medically stable. CSW managing disposition to SNF. CM to monitor as need presents.  Expected Discharge Date:     05/26/2011             Expected Discharge Plan:  [place] ([facility])  In-House Referral:  Clinical Social Work  Discharge planning Services  CM Consult  Post Acute Care Choice:    Choice offered to:     DME Arranged:    DME Agency:     HH Arranged:    [place] Agency:     Status of Service:  Completed, signed off  If discussed at [employer] [facility] [employer], dates discussed:    Additional Comments:  Sharin Mons, RN 05/24/2016, 7:30 PM

## 2016-05-24 NOTE — Progress Notes (Signed)
Physical Therapy Treatment Patient Details Name: Rebecca Knight MRN: [id number] DOB: 1922-05-26 Today's Date: 05/24/2016    History of Present Illness Patient is a 83 yo female admitted 05/11/16 from [facility] with sepsis due to UTI, encephalopathy, AKI.    PMH:  s/p Rt hemiarthroplasty 04/19/17, dislocation and revision 04/20/17;  dementia, HTN, HLD, CVA, anxiety    PT Comments    Pt consented to treatment today with improvements compared to last treatment; she progress into standing and taking steps with max A +2 for safety. Patient is very limited due to fatigue but willing to participate with what she is able. She required max A +2 for all mobility and unable to progress ambulation beyond a few steps due to weakness. Pt is planning on being discharged to SNF to continue improving towards higher level of functional independence.    Follow Up Recommendations  SNF;Supervision/Assistance - 24 hour     Equipment Recommendations  None recommended by PT    Recommendations for Other Services       Precautions / Restrictions Precautions Precautions: Posterior Hip;Fall Restrictions Weight Bearing Restrictions: Yes Other Position/Activity Restrictions: WBAT RLE     Mobility  Bed Mobility Overal bed mobility: Needs Assistance Bed Mobility: Supine to Sit     Supine to sit: HOB elevated;Total assist     General bed mobility comments: verbal and tactile cuing required for LE sequencing off bed. Patient able to initiate LE movement but limited due to weakness. Total A to move trunk into sitting at EOB and using chux pad to scoot forward.   Transfers Overall transfer level: Needs assistance Equipment used: Rolling walker (2 wheeled) Transfers: Sit to/from [facility] Sit to Stand: Max assist;+2 physical assistance Stand pivot transfers: Max assist;+2 physical assistance       General transfer comment: Pt willing to attempt all transfers but able to perform due  to overall weakness. Pt required Max A +2 to boost into standing and cuing to maintain upright posture.  Ambulation/Gait Ambulation/Gait assistance: Max assist;+2 physical assistance       Gait velocity: very slow   General Gait Details: steps to chair; pt required Max A +2 for step sequencing, cuing for upright posture and advancement of RW due to poor initiation.    Stairs            Wheelchair Mobility    Modified Rankin (Stroke Patients Only)       Balance Overall balance assessment: History of Falls;Needs assistance   Sitting balance-Leahy Scale: Poor Sitting balance - Comments: Pt unable to correct and maintain upright posture without min A from SPTA.  Cuing for maintaing good head posture during static sitting.  Postural control: Left lateral lean Standing balance support: Bilateral upper extremity supported Standing balance-Leahy Scale: Poor Standing balance comment: static = poor, dynamic = zero                    Cognition Arousal/Alertness: Awake/alert Behavior During Therapy: Flat affect                   General Comments: able to follow commands     Exercises      General Comments        Pertinent Vitals/Pain Faces Pain Scale: Hurts little more Pain Location: wrist during WB activity Pain Descriptors / Indicators: Grimacing Pain Intervention(s): Monitored during session;Heat applied    Home Living  Prior Function            PT Goals (current goals can now be found in the care plan section) Acute Rehab PT Goals Patient Stated Goal: None stated Potential to Achieve Goals: Fair Progress towards PT goals: Progressing toward goals    Frequency    Min 3X/week      PT Plan Current plan remains appropriate    Co-evaluation             End of Session Equipment Utilized During Treatment: Gait belt Activity Tolerance: Patient limited by fatigue;Patient limited by lethargy Patient  left: with call bell/phone within reach;in chair;with chair alarm set     Time: 1426-1450 PT Time Calculation (min) (ACUTE ONLY): 24 min  Charges:  $Therapeutic Activity: 23-37 mins                    G Codes:      [facility] 05-27-2016, 3:19 PM  [place], [place] Pager [phone]

## 2016-05-24 NOTE — Progress Notes (Signed)
PROGRESS NOTE    Rebecca Knight  [id number] DOB: November 12, 1922 DOA: 05/21/2016 PCP: [name], DO    Brief Narrative:  83 yo female with dementia and history of cva, from SNF, presents with progressive worsening mentation for several days, associated with poor oral intake. Recent hospitalization on 12/29 for hip hemiarthroplasty. Noted dehydrated with leukocytosis. Transferred to to hospital for further management.  UA abnormal, on Abx for UTI, improving Na climbling on NS   Assessment & Plan:   Principal Problem:   Sepsis ([place]) Active Problems:   HYPERLIPIDEMIA   Essential hypertension   Acute encephalopathy   UTI (urinary tract infection)   Acute kidney injury ([place])   Dehydration, severe   History of right hip hemiarthroplasty 04/19/16   Dementia   Complicated UTI (urinary tract infection)   Pressure injury of skin   Hypokalemia   Hypernatremia   1. Sepsis due to urine infection, present on admission. -UA-turbid, many bacteria, numerous WBC, -culture-polymicrobial -continue ceftriaxone while inpatient and change to Keflex at discharge to SNF for few more days -continue gentle IVF today   2. Acute metabolic encephalopathy -Likely secondary to urinary tract infection and dehydration. Patient with some clinical improvement although somewhat drowsy today. Patient following commands no agitation with some confusion. IV fluids were changed to D5W secondary to hypernatremia. Hyponatremia improving. Continue gentle hydration. Continue empiric IV antibiotics. Follow.  3. Hypernatremia -Patient noted to be getting hypernatremic during the hospitalization with Na 144-> 146-> 148. Felt likely secondary to dehydration as patient noted to have dry mucous membranes. Encourage fluid intake. IV fluids were changed to D5W with improvement with hypernatremia. Follow.  4. HTN. -stable, continue  metoprolol  4. AKI due to dehydration -creatinine improving and currently at 0.77 from  1.2 from 2.3. Follow.   5. Dyslipidemia. Continue statin therapy.    6. Dementia -with some confusion and disorientation, no agitation.    -stable  7. Hypokalemia Replete.    DVT prophylaxis: Heparin Code Status: Full Family Communication: Updated patient. No family at bedside. Disposition Plan: Back to skilled nursing facility once hypernatremia has resolved and hypokalemia has been repleted hopefully tomorrow 05/25/2016.   Consultants:   None  Procedures:  CT head without contrast 05/21/2016  Chest x-ray 05/21/2016    Antimicrobials:   IV Rocephin 05/22/2016   Subjective: Patient sleeping and lethargic, but easily arousable. Patient denies any chest pain. No shortness of breath.  Objective: Vitals:   05/23/16 1439 05/23/16 2040 05/24/16 0518 05/24/16 1503  BP: (!) 153/78 (!) 152/99 (!) 151/86 139/74  Pulse: 80 83 85 84  Resp: 18 17 16 17   Temp: 97.4 F (36.3 C) 98.3 F (36.8 C) 97.5 F (36.4 C) 98 F (36.7 C)  TempSrc: Oral Oral Oral Oral  SpO2: 100% 100% 96% 100%  Weight:      Height:        Intake/Output Summary (Last 24 hours) at 05/24/16 2019 Last data filed at 05/24/16 1500  Gross per 24 hour  Intake             1590 ml  Output                0 ml  Net             1590 ml   Filed Weights   05/22/16 1159  Weight: 52.2 kg (115 lb)    Examination:  General exam: Patient somewhat lethargic, opens eyes to verbal stimuli. Dry mucous membranes. Respiratory system: Decreased breath  sounds in the bases. Respiratory effort normal. Cardiovascular system: S1 & S2 heard, RRR. No JVD, murmurs, rubs, gallops or clicks. No pedal edema. Gastrointestinal system: Abdomen is nondistended, soft and nontender. No organomegaly or masses felt. Normal bowel sounds heard. Central nervous system: Alert and oriented. No focal neurological deficits. Extremities: Symmetric 5 x 5 power. Skin: No rashes, lesions or ulcers    Data Reviewed: I have personally  reviewed following labs and imaging studies  CBC:  Recent Labs Lab 05/21/16 1317 05/22/16 0758 05/23/16 0443 05/24/16 0745  WBC 28.4* 20.2* 9.7 9.7  NEUTROABS 26.2*  --  8.4*  --   HGB 11.1* 10.5* 10.6* 10.3*  HCT 36.4 34.7* 35.4* 34.2*  MCV 85.6 87.8 87.4 86.6  PLT 303 287 222 [id number]   Basic Metabolic Panel:  Recent Labs Lab 05/21/16 1317 05/22/16 0758 05/23/16 0443 05/24/16 0745  NA 144 146* 148* 144  K 3.6 3.2* 3.2* 3.0*  CL 105 114* 119* 114*  CO2 26 23 22 24   GLUCOSE 135* 112* 139* 125*  BUN 86* 64* 44* 25*  CREATININE 2.30* 1.27* 0.90 0.77  CALCIUM 9.3 8.8* 9.2 8.9  MG  --   --   --  1.8   GFR: Estimated Creatinine Clearance: 34.6 mL/min (by C-G formula based on SCr of 0.77 mg/dL). Liver Function Tests:  Recent Labs Lab 05/21/16 1317  AST 16  ALT 8*  ALKPHOS 94  BILITOT 0.7  PROT 5.0*  ALBUMIN 2.4*   No results for input(s): LIPASE, AMYLASE in the last 168 hours. No results for input(s): AMMONIA in the last 168 hours. Coagulation Profile:  Recent Labs Lab 05/21/16 1705  INR 1.36   Cardiac Enzymes: No results for input(s): CKTOTAL, CKMB, CKMBINDEX, TROPONINI in the last 168 hours. BNP (last 3 results) No results for input(s): PROBNP in the last 8760 hours. HbA1C: No results for input(s): HGBA1C in the last 72 hours. CBG:  Recent Labs Lab 05/22/16 0919  GLUCAP 82   Lipid Profile: No results for input(s): CHOL, HDL, LDLCALC, TRIG, CHOLHDL, LDLDIRECT in the last 72 hours. Thyroid Function Tests: No results for input(s): TSH, T4TOTAL, FREET4, T3FREE, THYROIDAB in the last 72 hours. Anemia Panel: No results for input(s): VITAMINB12, FOLATE, FERRITIN, TIBC, IRON, RETICCTPCT in the last 72 hours. Sepsis Labs:  Recent Labs Lab 05/21/16 1327 05/21/16 1651 05/21/16 1705 05/21/16 1744 05/21/16 2238  PROCALCITON  --   --  0.52  --   --   LATICACIDVEN 1.58 2.84*  --  2.1* 1.4    Recent Results (from the past 240 hour(s))  Blood culture  (routine x 2)     Status: None (Preliminary result)   Collection Time: 05/21/16  1:17 PM  Result Value Ref Range Status   Specimen Description BLOOD LEFT FOREARM  Final   Special Requests BOTTLES DRAWN AEROBIC AND ANAEROBIC 5CC  Final   Culture NO GROWTH 3 DAYS  Final   Report Status PENDING  Incomplete  Blood culture (routine x 2)     Status: None (Preliminary result)   Collection Time: 05/21/16  2:36 PM  Result Value Ref Range Status   Specimen Description BLOOD RIGHT ANTECUBITAL  Final   Special Requests BOTTLES DRAWN AEROBIC ONLY 10CC  Final   Culture NO GROWTH 3 DAYS  Final   Report Status PENDING  Incomplete  Urine culture     Status: Abnormal   Collection Time: 05/21/16  4:25 PM  Result Value Ref Range Status   Specimen Description URINE,  CATHETERIZED  Final   Special Requests NONE  Final   Culture MULTIPLE SPECIES PRESENT, SUGGEST RECOLLECTION (A)  Final   Report Status 05/23/2016 FINAL  Final         Radiology Studies: No results found.      Scheduled Meds: . aspirin EC  81 mg Oral Daily  . cefTRIAXone (ROCEPHIN)  IV  1 g Intravenous Q24H  . feeding supplement  1 Container Oral TID BM  . gabapentin  100 mg Oral BID  . heparin  5,000 Units Subcutaneous Q8H  . metoprolol succinate  50 mg Oral Daily  . mirtazapine  7.5 mg Oral QHS  . polyvinyl alcohol  1 drop Both Eyes QID  . pravastatin  20 mg Oral q1800  . sodium chloride flush  3 mL Intravenous Q12H  . sodium chloride flush  3 mL Intravenous Q12H   Continuous Infusions: . dextrose 50 mL/hr at 05/24/16 0532     LOS: 3 days    Time spent: 23 minutes    THOMPSON,DANIEL, MD [facility] Pager [phone]  If 7PM-7AM, please contact night-coverage [email or web address] Password [facility] 05/24/2016, 8:19 PM

## 2016-05-25 DIAGNOSIS — G9341 Metabolic encephalopathy: Secondary | ICD-10-CM

## 2016-05-25 DIAGNOSIS — R1310 Dysphagia, unspecified: Secondary | ICD-10-CM

## 2016-05-25 LAB — BASIC METABOLIC PANEL
ANION GAP: 5 (ref 5–15)
BUN: 19 mg/dL (ref 6–20)
CALCIUM: 8.9 mg/dL (ref 8.9–10.3)
CO2: 21 mmol/L — AB (ref 22–32)
Chloride: 116 mmol/L — ABNORMAL HIGH (ref 101–111)
Creatinine, Ser: 0.78 mg/dL (ref 0.44–1.00)
GFR calc Af Amer: >60 mL/min (ref >60)
GFR calc non Af Amer: >60 mL/min (ref >60)
Glucose, Bld: 122 mg/dL — ABNORMAL HIGH (ref 65–99)
Potassium: 4.6 mmol/L (ref 3.5–5.1)
Sodium: 142 mmol/L (ref 135–145)

## 2016-05-25 MED ORDER — METOPROLOL SUCCINATE ER 25 MG PO TB24
25.0000 mg | ORAL_TABLET | Freq: Every day | ORAL | Status: DC
  Administered 2016-05-26 – 2016-05-28 (×3): 25 mg via ORAL
  Filled 2016-05-25 (×3): qty 1

## 2016-05-25 NOTE — Evaluation (Signed)
Clinical/Bedside Swallow Evaluation Patient Details  Name: Rebecca Knight MRN: [id number] Date of Birth: 1922-08-29  Today's Date: 05/25/2016 Time: SLP Start Time (ACUTE ONLY): 16 SLP Stop Time (ACUTE ONLY): 1459 SLP Time Calculation (min) (ACUTE ONLY): 23 min  Past Medical History:  Past Medical History:  Diagnosis Date  . AAA (abdominal aortic aneurysm) (HCC)    [name]  . Anxiety   . Arthritis   . Calculus of gallbladder without mention of cholecystitis or obstruction   . Cancer [facility])    Colon and Rectal  . Diverticulosis of colon   . Heart murmur   . Herpes zoster of eye 2002   Dr. Herbert Deaner  . History of colon cancer 1995   Dr Lennie Hummer  . Hyperlipidemia   . Hypertension   . Osteoporosis   . Post herpetic neuralgia   . Varicose veins    Past Surgical History:  Past Surgical History:  Procedure Laterality Date  . COLON SURGERY     RESECTION in 1990s  . ENDOVASCULAR STENT INSERTION  08/08/2011   Procedure: ENDOVASCULAR STENT GRAFT INSERTION;  Surgeon: [name], MD;  [facility];  Service: Vascular;  Laterality: N/A;  Endo Vascular Aortic stent placement with gore  . EYE SURGERY     CAT EXT  OU  . HIP ARTHROPLASTY Right 04/19/2016   Procedure: ARTHROPLASTY BIPOLAR HIP (HEMIARTHROPLASTY);  Surgeon: [name], MD;  Location: [place];  Service: Orthopedics;  Laterality: Right;  . HIP ARTHROPLASTY Right 04/20/2016   Procedure: REVISION ARTHROPLASTY BIPOLAR HIP (HEMIARTHROPLASTY);  Surgeon: [name], MD;  Location: [place];  Service: Orthopedics;  Laterality: Right;  . HIP CLOSED REDUCTION Right 04/19/2016   Procedure: CLOSED MANIPULATION HIP;  Surgeon: [name], MD;  Location: [place];  Service: Orthopedics;  Laterality: Right;  . TONSILLECTOMY     HPI:  83 yo female with dementia and history of cva, from SNF, presents with progressive worsening mentation for several days, associated with poor oral intake. Recent hospitalization on 12/29 for hip  hemiarthroplasty. Admitted 05/21/16 with sepsis due to UTI, acute encephalopathy, dehydrated with leukocytosis. Seen for cognitive-linguistic evaluation s/p left internal capsule CVA with no follow-up indicated. No prior history of dysphagia per chart. Seen today for bedside swallowing evaluation.    Assessment / Plan / Recommendation Clinical Impression  Patient presents with moderate aspiration risk given prior CVA, dementia, and deconditioning. She exhibits overt signs/symptoms of aspiration with thin and nectar-thick liquids. Suspect aspiration due to swallow delay/premature spillage. She is noted with reduced oral control of liquids, multiple audible swallows per bolus, intermittent wet vocal quality, immediate and delayed cough with trials of thin and nectar-thick liquids. These overt signs of aspiration were not noted with honey thick liquids or pureed solids. Prolonged mastication/ poor bolus formation noted with mechanical soft solids. Recommend instrumental study (MBS) to rule out aspiration, aid in determing safest, least restrictive diet and to identify compensatory techniques to improve swallow function. Recommend conservative diet of dysphagia 1 with honey thick liquids, meds crushed in puree pending completion of MBS which has been ordered for tomorrow.     Aspiration Risk  Moderate aspiration risk;Risk for inadequate nutrition/hydration    Diet Recommendation Honey-thick liquid;Dysphagia 1 (Puree)   Liquid Administration via: Spoon;Cup Medication Administration: Crushed with puree Supervision: Full supervision/cueing for compensatory strategies;Staff to assist with self feeding Compensations: Slow rate;Small sips/bites Postural Changes: Seated upright at 90 degrees    Other  Recommendations Oral Care Recommendations: Oral  care BID Other Recommendations: Prohibited food (jello, ice cream, thin soups);Order thickener from pharmacy   Follow up Recommendations Skilled Nursing facility       Frequency and Duration min 2x/week  2 weeks       Prognosis Prognosis for Safe Diet Advancement: Fair Barriers to Reach Goals: Cognitive deficits;Time post onset      Swallow Study   General Date of Onset: 05/21/16 HPI: 83 yo female with dementia and history of cva, from SNF, presents with progressive worsening mentation for several days, associated with poor oral intake. Recent hospitalization on 12/29 for hip hemiarthroplasty. Admitted 05/21/16 with sepsis due to UTI, acute encephalopathy, dehydrated with leukocytosis. Seen for cognitive-linguistic evaluation s/p left internal capsule CVA with no follow-up indicated. No prior history of dysphagia per chart. Seen today for bedside swallowing evaluation.  Type of Study: Bedside Swallow Evaluation Previous Swallow Assessment: none per chart Diet Prior to this Study: Thin liquids (full liquids) Temperature Spikes Noted: No Respiratory Status: Room air History of Recent Intubation: No Behavior/Cognition: Alert;Cooperative Oral Cavity Assessment: Within Functional Limits Oral Care Completed by SLP: Yes Oral Cavity - Dentition: Adequate natural dentition Vision: Functional for self-feeding Self-Feeding Abilities: Able to feed self;Needs assist Patient Positioning: Upright in bed Baseline Vocal Quality: Low vocal intensity Volitional Cough: Weak Volitional Swallow: Able to elicit    Oral/Motor/Sensory Function Overall Oral Motor/Sensory Function: Within functional limits   Ice Chips Ice chips: Within functional limits   Thin Liquid Thin Liquid: Impaired Presentation: Cup;Spoon;Straw Oral Phase Impairments: Reduced labial seal;Poor awareness of bolus Oral Phase Functional Implications: Right anterior spillage;Oral holding Pharyngeal  Phase Impairments: Suspected delayed Swallow;Multiple swallows;Wet Vocal Quality;Cough - Delayed;Cough - Immediate    Nectar Thick Nectar Thick Liquid: Impaired Presentation: Cup;Spoon Oral Phase  Impairments: Poor awareness of bolus Oral phase functional implications: Prolonged oral transit Pharyngeal Phase Impairments: Suspected delayed Swallow;Multiple swallows;Wet Vocal Quality;Throat Clearing - Immediate;Cough - Delayed   Honey Thick Honey Thick Liquid: Within functional limits Presentation: Spoon;Cup   Puree Puree: Within functional limits Presentation: Spoon   Solid   GO  Rebecca Knight, [place] CF-SLP Speech-Language Pathologist [phone] Solid: Impaired (mechanical soft solid) Presentation: Spoon Oral Phase Impairments: Impaired mastication Oral Phase Functional Implications: Impaired mastication;Oral residue        Rebecca Knight 05/25/2016,5:32 PM

## 2016-05-25 NOTE — Progress Notes (Signed)
[employer] SNF auth received. [facility] agreeable to accept today, if she is medically ready, and family can do admission paperwork.  CSW will continue to follow for disposition.  Creta Levin, LCSW Weekend Coverage [id number]

## 2016-05-25 NOTE — Progress Notes (Signed)
PROGRESS NOTE    Rebecca BALDONADO  [id number] DOB: 30-Sep-1922 DOA: 05/21/2016 PCP: [name], DO     Brief Narrative:  83 yo WF from SNF PMHx Dementia, Anxiety, CVA, AAA ,Heart murmur, HTN, HLD, Colon/Rectal cancer, Post herpetic neuralgia  Presents with progressive worsening mentation for several days, associated with poor oral intake. Recent hospitalization on 12/29 for hip hemiarthroplasty. Noted dehydrated with leukocytosis. Transferred to to hospital for further management.  UA abnormal, on Abx for UTI, improving Na climbling on NS   Subjective: 2/3   A/O 4. Cachectic. Patient appears to be aspirating. States prior to hospitalization was able to consume normal diet.    Assessment & Plan:   Principal Problem:   Sepsis ([place]) Active Problems:   HYPERLIPIDEMIA   Essential hypertension   Acute encephalopathy   UTI (urinary tract infection)   Acute kidney injury ([place])   Dehydration, severe   History of right hip hemiarthroplasty 04/19/16   Dementia   Complicated UTI (urinary tract infection)   Pressure injury of skin   Hypokalemia   Hypernatremia   Sepsis due to UTI, present on admission. -UA-turbid, many bacteria, numerous WBC, -culture-polymicrobial -Complete 5 day course antibiotics  -continue gentle IVF today   Acute metabolic encephalopathy -Likely secondary to urinary tract infection and dehydration. -Resolved  Dementia -with someconfusion and disorientation, no agitation.  -stable   Dysphagia -Appears patient is aspirating speeches scheduled MBS for 2/4 -For now dysphagia 1 honey thick liquid  Hypernatremia -Resolved - Continue D5W at 50 ml/hr  HTN. -Decrease Toprol to 25 mg daily. Goal SBP 145 given patient's age and risk for fall  AKI due to dehydration  Lab Results  Component Value Date   CREATININE 0.78 05/25/2016   CREATININE 0.77 05/24/2016   CREATININE 0.90 05/23/2016  -Resolved   Dyslipidemia. Continue statin therapy.    Hypokalemia Replete.     DVT prophylaxis: Heparin Code Status: Full Family Communication: None Disposition Plan: SNF   Consultants:  None  Procedures/Significant Events:   CT head without contrast 05/21/2016  Chest x-ray 05/21/2016    VENTILATOR SETTINGS:    Cultures   Antimicrobials: Anti-infectives    Start     Stop   05/22/16 1800  cefTRIAXone (ROCEPHIN) 1 g in dextrose 5 % 50 mL IVPB         05/22/16 1000  cefTRIAXone (ROCEPHIN) 1 g in dextrose 5 % 50 mL IVPB  Status:  Discontinued     05/21/16 2051   05/21/16 1630  cefTRIAXone (ROCEPHIN) 1 g in dextrose 5 % 50 mL IVPB     05/22/16 1417       Devices    LINES / TUBES:      Continuous Infusions: . dextrose 50 mL/hr at 05/25/16 0009     Objective: Vitals:   05/24/16 2354 05/25/16 0320 05/25/16 0659 05/25/16 1410  BP:  133/71 (!) 178/84 137/82  Pulse: 91 69 93 85  Resp:  18 18 17   Temp:  97.5 F (36.4 C) 98 F (36.7 C) 98.2 F (36.8 C)  TempSrc:   Oral Oral  SpO2: 98% 98% 96% 99%  Weight:      Height:        Intake/Output Summary (Last 24 hours) at 05/25/16 1439 Last data filed at 05/25/16 0009  Gross per 24 hour  Intake           1037.5 ml  Output  0 ml  Net           1037.5 ml   Filed Weights   05/22/16 1159  Weight: 52.2 kg (115 lb)    Examination:  General: A/O 4, NAD, cachectic No acute respiratory distress Eyes: negative scleral hemorrhage, negative anisocoria, negative icterus ENT: Negative Runny nose, negative gingival bleeding, Neck:  Negative scars, masses, torticollis, lymphadenopathy, JVD Lungs: Clear to auscultation bilaterally without wheezes or crackles Cardiovascular: Regular rate and rhythm without murmur gallop or rub normal S1 and S2 Abdomen: negative abdominal pain, nondistended, positive soft, bowel sounds, no rebound, no ascites, no appreciable mass Extremities: No significant cyanosis, clubbing, or edema bilateral lower  extremities Skin: Negative rashes, lesions, ulcers Psychiatric:  Negative depression, negative anxiety, negative fatigue, negative mania  Central nervous system:  Cranial nerves II through XII intact, tongue/uvula midline, all extremities muscle strength 5/5, sensation intact throughout,  negative dysarthria, negative expressive aphasia, negative receptive aphasia.  .     Data Reviewed: Care during the described time interval was provided by me .  I have reviewed this patient's available data, including medical history, events of note, physical examination, and all test results as part of my evaluation. I have personally reviewed and interpreted all radiology studies.  CBC:  Recent Labs Lab 05/21/16 1317 05/22/16 0758 05/23/16 0443 05/24/16 0745  WBC 28.4* 20.2* 9.7 9.7  NEUTROABS 26.2*  --  8.4*  --   HGB 11.1* 10.5* 10.6* 10.3*  HCT 36.4 34.7* 35.4* 34.2*  MCV 85.6 87.8 87.4 86.6  PLT 303 287 222 [id number]   Basic Metabolic Panel:  Recent Labs Lab 05/21/16 1317 05/22/16 0758 05/23/16 0443 05/24/16 0745 05/25/16 0856  NA 144 146* 148* 144 142  K 3.6 3.2* 3.2* 3.0* 4.6  CL 105 114* 119* 114* 116*  CO2 26 23 22 24  21*  GLUCOSE 135* 112* 139* 125* 122*  BUN 86* 64* 44* 25* 19  CREATININE 2.30* 1.27* 0.90 0.77 0.78  CALCIUM 9.3 8.8* 9.2 8.9 8.9  MG  --   --   --  1.8  --    GFR: Estimated Creatinine Clearance: 34.6 mL/min (by C-G formula based on SCr of 0.78 mg/dL). Liver Function Tests:  Recent Labs Lab 05/21/16 1317  AST 16  ALT 8*  ALKPHOS 94  BILITOT 0.7  PROT 5.0*  ALBUMIN 2.4*   No results for input(s): LIPASE, AMYLASE in the last 168 hours. No results for input(s): AMMONIA in the last 168 hours. Coagulation Profile:  Recent Labs Lab 05/21/16 1705  INR 1.36   Cardiac Enzymes: No results for input(s): CKTOTAL, CKMB, CKMBINDEX, TROPONINI in the last 168 hours. BNP (last 3 results) No results for input(s): PROBNP in the last 8760 hours. HbA1C: No  results for input(s): HGBA1C in the last 72 hours. CBG:  Recent Labs Lab 05/22/16 0919  GLUCAP 82   Lipid Profile: No results for input(s): CHOL, HDL, LDLCALC, TRIG, CHOLHDL, LDLDIRECT in the last 72 hours. Thyroid Function Tests: No results for input(s): TSH, T4TOTAL, FREET4, T3FREE, THYROIDAB in the last 72 hours. Anemia Panel: No results for input(s): VITAMINB12, FOLATE, FERRITIN, TIBC, IRON, RETICCTPCT in the last 72 hours. Sepsis Labs:  Recent Labs Lab 05/21/16 1327 05/21/16 1651 05/21/16 1705 05/21/16 1744 05/21/16 2238  PROCALCITON  --   --  0.52  --   --   LATICACIDVEN 1.58 2.84*  --  2.1* 1.4    Recent Results (from the past 240 hour(s))  Blood culture (routine x 2)  Status: None (Preliminary result)   Collection Time: 05/21/16  1:17 PM  Result Value Ref Range Status   Specimen Description BLOOD LEFT FOREARM  Final   Special Requests BOTTLES DRAWN AEROBIC AND ANAEROBIC 5CC  Final   Culture NO GROWTH 4 DAYS  Final   Report Status PENDING  Incomplete  Blood culture (routine x 2)     Status: None (Preliminary result)   Collection Time: 05/21/16  2:36 PM  Result Value Ref Range Status   Specimen Description BLOOD RIGHT ANTECUBITAL  Final   Special Requests BOTTLES DRAWN AEROBIC ONLY 10CC  Final   Culture NO GROWTH 4 DAYS  Final   Report Status PENDING  Incomplete  Urine culture     Status: Abnormal   Collection Time: 05/21/16  4:25 PM  Result Value Ref Range Status   Specimen Description URINE, CATHETERIZED  Final   Special Requests NONE  Final   Culture MULTIPLE SPECIES PRESENT, SUGGEST RECOLLECTION (A)  Final   Report Status 05/23/2016 FINAL  Final         Radiology Studies: No results found.      Scheduled Meds: . aspirin EC  81 mg Oral Daily  . cefTRIAXone (ROCEPHIN)  IV  1 g Intravenous Q24H  . feeding supplement  1 Container Oral TID BM  . gabapentin  100 mg Oral BID  . heparin  5,000 Units Subcutaneous Q8H  . metoprolol succinate  50  mg Oral Daily  . mirtazapine  7.5 mg Oral QHS  . polyvinyl alcohol  1 drop Both Eyes QID  . pravastatin  20 mg Oral q1800  . sodium chloride flush  3 mL Intravenous Q12H  . sodium chloride flush  3 mL Intravenous Q12H   Continuous Infusions: . dextrose 50 mL/hr at 05/25/16 0009     LOS: 4 days    Time spent:40 min    WOODS, Geraldo Docker, MD [facility] Pager [phone]  If 7PM-7AM, please contact night-coverage [email or web address] Password [facility] 05/25/2016, 2:39 PM

## 2016-05-26 ENCOUNTER — Inpatient Hospital Stay (HOSPITAL_COMMUNITY): Payer: Medicare Other

## 2016-05-26 DIAGNOSIS — R1310 Dysphagia, unspecified: Secondary | ICD-10-CM

## 2016-05-26 DIAGNOSIS — G9341 Metabolic encephalopathy: Secondary | ICD-10-CM

## 2016-05-26 DIAGNOSIS — N390 Urinary tract infection, site not specified: Secondary | ICD-10-CM

## 2016-05-26 DIAGNOSIS — E785 Hyperlipidemia, unspecified: Secondary | ICD-10-CM

## 2016-05-26 DIAGNOSIS — A419 Sepsis, unspecified organism: Secondary | ICD-10-CM

## 2016-05-26 DIAGNOSIS — F0150 Vascular dementia without behavioral disturbance: Secondary | ICD-10-CM

## 2016-05-26 LAB — BASIC METABOLIC PANEL
Anion gap: 11 (ref 5–15)
BUN: 10 mg/dL (ref 6–20)
CALCIUM: 8.7 mg/dL — AB (ref 8.9–10.3)
CO2: 26 mmol/L (ref 22–32)
CREATININE: 0.68 mg/dL (ref 0.44–1.00)
Chloride: 97 mmol/L — ABNORMAL LOW (ref 101–111)
GFR calc Af Amer: >60 mL/min (ref >60)
GLUCOSE: 226 mg/dL — AB (ref 65–99)
Potassium: 2.8 mmol/L — ABNORMAL LOW (ref 3.5–5.1)
SODIUM: 134 mmol/L — AB (ref 135–145)

## 2016-05-26 LAB — MAGNESIUM: MAGNESIUM: 1.7 mg/dL (ref 1.7–2.4)

## 2016-05-26 LAB — CULTURE, BLOOD (ROUTINE X 2)
CULTURE: NO GROWTH
Culture: NO GROWTH

## 2016-05-26 NOTE — Progress Notes (Signed)
Modified Barium Swallow Progress Note  Patient Details  Name: Rebecca Knight MRN: [id number] Date of Birth: 08-Mar-1923  Today's Date: 05/26/2016  Modified Barium Swallow completed.  Full report located under Chart Review in the Imaging Section.  Brief recommendations include the following:  Clinical Impression  Patient presents with moderate-severe oral and mild pharyngeal dysphagia; with intermittent laryngeal penetration of thin liquids but no frank aspiration observed. Deficits are primarily oral; patient with oral holding, weak lingual manipulation, reduced coordination and lingual pumping with all consistencies. Required significant oral transit time. Pharyngeal stage of swallowing is characterized by moderately reduced tongue base retraction and pharyngeal constriction, leading to mild residue with thin and nectar-thick liquids (valleculae and pyriform) and moderate residue with pureed solids (valleculae). Intermittent penetration of thin liquid residue during and after the swallow with larger straw bolus due to swallow delay and pharyngeal residue. No penetration noted with teaspoons, small cup sips of thin liquids. Patient clears residue with cued dry swallow. Administered barium pill whole in applesauce; patient masticated tablet and required >1 min to manipulate bolus and initiate a swallow. Given increased work of eating with solids, recommend dysphagia 1 with thin liquids via teaspoon or small cup sips, medications crushed in puree. Patient will likely require assistance for feeding; she benefits from cued dry swallow to reduce pharyngeal residue.   Swallow Evaluation Recommendations       SLP Diet Recommendations: Dysphagia 1 (Puree) solids;Thin liquid   Liquid Administration via: Spoon;Cup;No straw   Medication Administration: Crushed with puree   Supervision: Staff to assist with self feeding;Full supervision/cueing for compensatory strategies   Compensations: Slow rate;Small  sips/bites;Minimize environmental distractions   Postural Changes: Seated upright at 90 degrees   Oral Care Recommendations: Oral care BID     Deneise Lever, [place] CF-SLP Speech-Language Pathologist [phone]  Aliene Altes 05/26/2016,1:46 PM

## 2016-05-26 NOTE — Progress Notes (Signed)
PROGRESS NOTE    Rebecca Knight  [id number] DOB: Apr 12, 1923 DOA: 05/21/2016 PCP: [name], DO     Brief Narrative:  83 yo WF from SNF PMHx Dementia, Anxiety, CVA, AAA ,Heart murmur, HTN, HLD, Colon/Rectal cancer, Post herpetic neuralgia  Presents with progressive worsening mentation for several days, associated with poor oral intake. Recent hospitalization on 12/29 for hip hemiarthroplasty. Noted dehydrated with leukocytosis. Transferred to to hospital for further management.  UA abnormal, on Abx for UTI, improving Na climbling on NS   Subjective: 2/4  A/O 4. Cachectic. States prior to hospitalization was able to consume normal diet.    Assessment & Plan:   Principal Problem:   Sepsis ([place]) Active Problems:   HYPERLIPIDEMIA   Essential hypertension   Acute encephalopathy   UTI (urinary tract infection)   Acute kidney injury ([place])   Dehydration, severe   History of right hip hemiarthroplasty 04/19/16   Dementia   Complicated UTI (urinary tract infection)   Pressure injury of skin   Hypokalemia   Hypernatremia   Sepsis due to UTI, present on admission. -UA-turbid, many bacteria, numerous WBC, -culture-polymicrobial -Completed 5 day course antibiotics  -continue gentle D5W 50 ml/hr   Acute metabolic encephalopathy -Likely secondary to urinary tract infection and dehydration. -Resolved  Dementia -with someconfusion and disorientation, no agitation.  -stable   Dysphagia -2/4 MBS: Patient now on Dysphagia 1 liquid consistency thin. -If patient tolerates diet overnight DC in a.m.  Hypernatremia -Resolved - Continue D5W at 50 ml/hr  HTN. -Decrease Toprol to 25 mg daily. Goal SBP 145 given patient's age and risk for fall  AKI due to dehydration  Lab Results  Component Value Date   CREATININE 0.78 05/25/2016   CREATININE 0.77 05/24/2016   CREATININE 0.90 05/23/2016  -Resolved   Dyslipidemia.  -Continue statin therapy.    Hypokalemia Replete.     DVT prophylaxis: Heparin Code Status: Full Family Communication: None Disposition Plan: SNF   Consultants:  None   Procedures/Significant Events:   CT head without contrast 05/21/2016  Chest x-ray 05/21/2016    VENTILATOR SETTINGS:    Cultures 1/30 blood 2 negative 1/30 urine positive multiple species     Antimicrobials: Anti-infectives    Start     Stop   05/22/16 1800  cefTRIAXone (ROCEPHIN) 1 g in dextrose 5 % 50 mL IVPB         05/22/16 1000  cefTRIAXone (ROCEPHIN) 1 g in dextrose 5 % 50 mL IVPB  Status:  Discontinued     05/21/16 2051   05/21/16 1630  cefTRIAXone (ROCEPHIN) 1 g in dextrose 5 % 50 mL IVPB     05/22/16 1417       Devices    LINES / TUBES:      Continuous Infusions: . dextrose 50 mL/hr at 05/25/16 0009     Objective: Vitals:   05/25/16 0659 05/25/16 1410 05/25/16 2204 05/26/16 0447  BP: (!) 178/84 137/82 (!) 164/89 (!) 176/94  Pulse: 93 85 79 82  Resp: 18 17 18 17   Temp: 98 F (36.7 C) 98.2 F (36.8 C) 98.4 F (36.9 C) 97.8 F (36.6 C)  TempSrc: Oral Oral Oral Oral  SpO2: 96% 99% 98% 96%  Weight:      Height:        Intake/Output Summary (Last 24 hours) at 05/26/16 [id number] Last data filed at 05/26/16 0645  Gross per 24 hour  Intake  650 ml  Output                0 ml  Net              650 ml   Filed Weights   05/22/16 1159  Weight: 52.2 kg (115 lb)    Examination:  General: A/O 4, NAD, cachectic No acute respiratory distress Eyes: negative scleral hemorrhage, negative anisocoria, negative icterus ENT: Negative Runny nose, negative gingival bleeding, Neck:  Negative scars, masses, torticollis, lymphadenopathy, JVD Lungs: Clear to auscultation bilaterally without wheezes or crackles Cardiovascular: Regular rate and rhythm without murmur gallop or rub normal S1 and S2 Abdomen: negative abdominal pain, nondistended, positive soft, bowel sounds, no rebound, no  ascites, no appreciable mass Extremities: No significant cyanosis, clubbing, or edema bilateral lower extremities Skin: Negative rashes, lesions, ulcers Psychiatric:  Negative depression, negative anxiety, negative fatigue, negative mania  Central nervous system:  Cranial nerves II through XII intact, tongue/uvula midline, all extremities muscle strength 5/5, sensation intact throughout,  negative dysarthria, negative expressive aphasia, negative receptive aphasia.  .     Data Reviewed: Care during the described time interval was provided by me .  I have reviewed this patient's available data, including medical history, events of note, physical examination, and all test results as part of my evaluation. I have personally reviewed and interpreted all radiology studies.  CBC:  Recent Labs Lab 05/21/16 1317 05/22/16 0758 05/23/16 0443 05/24/16 0745  WBC 28.4* 20.2* 9.7 9.7  NEUTROABS 26.2*  --  8.4*  --   HGB 11.1* 10.5* 10.6* 10.3*  HCT 36.4 34.7* 35.4* 34.2*  MCV 85.6 87.8 87.4 86.6  PLT 303 287 222 [id number]   Basic Metabolic Panel:  Recent Labs Lab 05/21/16 1317 05/22/16 0758 05/23/16 0443 05/24/16 0745 05/25/16 0856  NA 144 146* 148* 144 142  K 3.6 3.2* 3.2* 3.0* 4.6  CL 105 114* 119* 114* 116*  CO2 26 23 22 24  21*  GLUCOSE 135* 112* 139* 125* 122*  BUN 86* 64* 44* 25* 19  CREATININE 2.30* 1.27* 0.90 0.77 0.78  CALCIUM 9.3 8.8* 9.2 8.9 8.9  MG  --   --   --  1.8  --    GFR: Estimated Creatinine Clearance: 34.6 mL/min (by C-G formula based on SCr of 0.78 mg/dL). Liver Function Tests:  Recent Labs Lab 05/21/16 1317  AST 16  ALT 8*  ALKPHOS 94  BILITOT 0.7  PROT 5.0*  ALBUMIN 2.4*   No results for input(s): LIPASE, AMYLASE in the last 168 hours. No results for input(s): AMMONIA in the last 168 hours. Coagulation Profile:  Recent Labs Lab 05/21/16 1705  INR 1.36   Cardiac Enzymes: No results for input(s): CKTOTAL, CKMB, CKMBINDEX, TROPONINI in the last  168 hours. BNP (last 3 results) No results for input(s): PROBNP in the last 8760 hours. HbA1C: No results for input(s): HGBA1C in the last 72 hours. CBG:  Recent Labs Lab 05/22/16 0919  GLUCAP 82   Lipid Profile: No results for input(s): CHOL, HDL, LDLCALC, TRIG, CHOLHDL, LDLDIRECT in the last 72 hours. Thyroid Function Tests: No results for input(s): TSH, T4TOTAL, FREET4, T3FREE, THYROIDAB in the last 72 hours. Anemia Panel: No results for input(s): VITAMINB12, FOLATE, FERRITIN, TIBC, IRON, RETICCTPCT in the last 72 hours. Sepsis Labs:  Recent Labs Lab 05/21/16 1327 05/21/16 1651 05/21/16 1705 05/21/16 1744 05/21/16 2238  PROCALCITON  --   --  0.52  --   --   LATICACIDVEN 1.58 2.84*  --  2.1* 1.4    Recent Results (from the past 240 hour(s))  Blood culture (routine x 2)     Status: None (Preliminary result)   Collection Time: 05/21/16  1:17 PM  Result Value Ref Range Status   Specimen Description BLOOD LEFT FOREARM  Final   Special Requests BOTTLES DRAWN AEROBIC AND ANAEROBIC 5CC  Final   Culture NO GROWTH 4 DAYS  Final   Report Status PENDING  Incomplete  Blood culture (routine x 2)     Status: None (Preliminary result)   Collection Time: 05/21/16  2:36 PM  Result Value Ref Range Status   Specimen Description BLOOD RIGHT ANTECUBITAL  Final   Special Requests BOTTLES DRAWN AEROBIC ONLY 10CC  Final   Culture NO GROWTH 4 DAYS  Final   Report Status PENDING  Incomplete  Urine culture     Status: Abnormal   Collection Time: 05/21/16  4:25 PM  Result Value Ref Range Status   Specimen Description URINE, CATHETERIZED  Final   Special Requests NONE  Final   Culture MULTIPLE SPECIES PRESENT, SUGGEST RECOLLECTION (A)  Final   Report Status 05/23/2016 FINAL  Final         Radiology Studies: No results found.      Scheduled Meds: . aspirin EC  81 mg Oral Daily  . cefTRIAXone (ROCEPHIN)  IV  1 g Intravenous Q24H  . feeding supplement  1 Container Oral TID BM   . gabapentin  100 mg Oral BID  . heparin  5,000 Units Subcutaneous Q8H  . metoprolol succinate  25 mg Oral Daily  . mirtazapine  7.5 mg Oral QHS  . polyvinyl alcohol  1 drop Both Eyes QID  . pravastatin  20 mg Oral q1800  . sodium chloride flush  3 mL Intravenous Q12H   Continuous Infusions: . dextrose 50 mL/hr at 05/25/16 0009     LOS: 5 days    Time spent:40 min    Moya Duan, Geraldo Docker, MD [facility] Pager [phone]  If 7PM-7AM, please contact night-coverage [email or web address] Password [email or web address] 05/26/2016, 8:22 AM

## 2016-05-27 DIAGNOSIS — G9340 Encephalopathy, unspecified: Secondary | ICD-10-CM

## 2016-05-27 DIAGNOSIS — N390 Urinary tract infection, site not specified: Secondary | ICD-10-CM

## 2016-05-27 LAB — BASIC METABOLIC PANEL
ANION GAP: 8 (ref 5–15)
BUN: 8 mg/dL (ref 6–20)
CHLORIDE: 102 mmol/L (ref 101–111)
CO2: 28 mmol/L (ref 22–32)
CREATININE: 0.69 mg/dL (ref 0.44–1.00)
Calcium: 9 mg/dL (ref 8.9–10.3)
GFR calc non Af Amer: >60 mL/min (ref >60)
Glucose, Bld: 108 mg/dL — ABNORMAL HIGH (ref 65–99)
Potassium: 3.2 mmol/L — ABNORMAL LOW (ref 3.5–5.1)
Sodium: 138 mmol/L (ref 135–145)

## 2016-05-27 MED ORDER — SODIUM CHLORIDE 0.9 % IV SOLN
30.0000 meq | Freq: Once | INTRAVENOUS | Status: AC
  Administered 2016-05-27: 30 meq via INTRAVENOUS
  Filled 2016-05-27: qty 15

## 2016-05-27 NOTE — Progress Notes (Addendum)
PROGRESS NOTE    Rebecca Knight  [id number] DOB: [birth year] DOA: 05/21/2016 PCP: [name], DO     Brief Narrative:  83 yo WF from SNF PMHx Dementia, Anxiety, CVA, AAA ,Heart murmur, HTN, HLD, Colon/Rectal cancer, Post herpetic neuralgia  Presents with progressive worsening mentation for several days, associated with poor oral intake. Recent hospitalization on 12/29 for hip hemiarthroplasty. Noted dehydrated with leukocytosis. Transferred to to hospital for further management.  UA abnormal, on Abx for UTI, improving Na climbling on NS   Subjective: Pt has no new complaints. No acute issues overnight.  Assessment & Plan:   Principal Problem:   Sepsis ([place]) Active Problems:   HYPERLIPIDEMIA   Essential hypertension   Acute encephalopathy   UTI (urinary tract infection)   Acute kidney injury ([place])   Dehydration, severe   History of right hip hemiarthroplasty 04/19/16   Dementia   Complicated UTI (urinary tract infection)   Pressure injury of skin   Hypokalemia   Hypernatremia   Sepsis secondary to UTI ([place])   Dysphagia   Metabolic encephalopathy   Dyslipidemia   Sepsis due to UTI, present on admission. -UA-turbid, many bacteria, numerous WBC, -culture-polymicrobial -Completed 5 day course antibiotics  -continue gentle D5W 50 ml/hr   Acute metabolic encephalopathy -Likely secondary to urinary tract infection and dehydration. -Resolved  Hypokalemia - will replace. And reassess next am.  Dementia -with someconfusion and disorientation, no agitation.  -stable   Dysphagia -2/4 MBS: Patient now on Dysphagia 1 liquid consistency thin. -If patient tolerates diet overnight DC in a.m.  Hypernatremia -Resolved - Continue D5W at 50 ml/hr  HTN. -Decrease Toprol to 25 mg daily. Goal SBP 145 given patient's age and risk for fall  AKI due to dehydration  Lab Results  Component Value Date   CREATININE 0.69 05/27/2016   CREATININE 0.68 05/26/2016   CREATININE 0.78 05/25/2016  -Resolved   Dyslipidemia.  -Continue statin therapy.    DVT prophylaxis: Heparin Code Status: Full Family Communication: None Disposition Plan: SNF   Consultants:  None   Procedures/Significant Events:   CT head without contrast 05/21/2016  Chest x-ray 05/21/2016    VENTILATOR SETTINGS:    Cultures 1/30 blood 2 negative 1/30 urine positive multiple species     Antimicrobials: Anti-infectives    Start     Stop   05/22/16 1800  cefTRIAXone (ROCEPHIN) 1 g in dextrose 5 % 50 mL IVPB         05/22/16 1000  cefTRIAXone (ROCEPHIN) 1 g in dextrose 5 % 50 mL IVPB  Status:  Discontinued     05/21/16 2051   05/21/16 1630  cefTRIAXone (ROCEPHIN) 1 g in dextrose 5 % 50 mL IVPB     05/22/16 1417       Devices    LINES / TUBES:      Continuous Infusions: . dextrose 500 mL (05/27/16 1316)     Objective: Vitals:   05/26/16 0447 05/26/16 2104 05/27/16 0648 05/27/16 1402  BP: (!) 176/94 137/86 (!) 178/81 (!) 141/86  Pulse: 82 86 74 99  Resp: 17 17 18 16   Temp: 97.8 F (36.6 C) 98.7 F (37.1 C) 97.4 F (36.3 C) 97.8 F (36.6 C)  TempSrc: Oral Oral Oral Oral  SpO2: 96% 96% 95% 100%  Weight:      Height:        Intake/Output Summary (Last 24 hours) at 05/27/16 1643 Last data filed at 05/26/16 1700  Gross per 24 hour  Intake  512.5 ml  Output                0 ml  Net            512.5 ml   Filed Weights   05/22/16 1159  Weight: 52.2 kg (115 lb)    Examination:  General: A/O 4, NAD, cachectic No acute respiratory distress Eyes: negative scleral hemorrhage, negative anisocoria, negative icterus ENT: Negative Runny nose, negative gingival bleeding, Neck:  Negative scars, masses, torticollis, lymphadenopathy, JVD Lungs: Clear to auscultation bilaterally without wheezes or crackles, equal chest rise. Cardiovascular: Regular rate and rhythm without murmur gallop or rub normal S1 and S2 Abdomen: negative  abdominal pain, nondistended, positive soft, bowel sounds, no rebound, no ascites, no appreciable mass Extremities: No significant cyanosis, clubbing, or edema bilateral lower extremities Skin: Negative rashes, lesions, ulcers Psychiatric:  Negative depression, negative anxiety, negative fatigue, negative mania  Central nervous system:  Cranial nerves II through XII intact, tongue/uvula midline, all extremities muscle strength 5/5, sensation intact throughout,  negative dysarthria, negative expressive aphasia, negative receptive aphasia.  .  Data Reviewed: reviewed most recent labs  CBC:  Recent Labs Lab 05/21/16 1317 05/22/16 0758 05/23/16 0443 05/24/16 0745  WBC 28.4* 20.2* 9.7 9.7  NEUTROABS 26.2*  --  8.4*  --   HGB 11.1* 10.5* 10.6* 10.3*  HCT 36.4 34.7* 35.4* 34.2*  MCV 85.6 87.8 87.4 86.6  PLT 303 287 222 [id number]   Basic Metabolic Panel:  Recent Labs Lab 05/23/16 0443 05/24/16 0745 05/25/16 0856 05/26/16 0919 05/27/16 1303  NA 148* 144 142 134* 138  K 3.2* 3.0* 4.6 2.8* 3.2*  CL 119* 114* 116* 97* 102  CO2 22 24 21* 26 28  GLUCOSE 139* 125* 122* 226* 108*  BUN 44* 25* 19 10 8   CREATININE 0.90 0.77 0.78 0.68 0.69  CALCIUM 9.2 8.9 8.9 8.7* 9.0  MG  --  1.8  --  1.7  --    GFR: Estimated Creatinine Clearance: 34.6 mL/min (by C-G formula based on SCr of 0.69 mg/dL). Liver Function Tests:  Recent Labs Lab 05/21/16 1317  AST 16  ALT 8*  ALKPHOS 94  BILITOT 0.7  PROT 5.0*  ALBUMIN 2.4*   No results for input(s): LIPASE, AMYLASE in the last 168 hours. No results for input(s): AMMONIA in the last 168 hours. Coagulation Profile:  Recent Labs Lab 05/21/16 1705  INR 1.36   Cardiac Enzymes: No results for input(s): CKTOTAL, CKMB, CKMBINDEX, TROPONINI in the last 168 hours. BNP (last 3 results) No results for input(s): PROBNP in the last 8760 hours. HbA1C: No results for input(s): HGBA1C in the last 72 hours. CBG:  Recent Labs Lab 05/22/16 0919    GLUCAP 82   Lipid Profile: No results for input(s): CHOL, HDL, LDLCALC, TRIG, CHOLHDL, LDLDIRECT in the last 72 hours. Thyroid Function Tests: No results for input(s): TSH, T4TOTAL, FREET4, T3FREE, THYROIDAB in the last 72 hours. Anemia Panel: No results for input(s): VITAMINB12, FOLATE, FERRITIN, TIBC, IRON, RETICCTPCT in the last 72 hours. Sepsis Labs:  Recent Labs Lab 05/21/16 1327 05/21/16 1651 05/21/16 1705 05/21/16 1744 05/21/16 2238  PROCALCITON  --   --  0.52  --   --   LATICACIDVEN 1.58 2.84*  --  2.1* 1.4    Recent Results (from the past 240 hour(s))  Blood culture (routine x 2)     Status: None   Collection Time: 05/21/16  1:17 PM  Result Value Ref Range Status   Specimen  Description BLOOD LEFT FOREARM  Final   Special Requests BOTTLES DRAWN AEROBIC AND ANAEROBIC 5CC  Final   Culture NO GROWTH 5 DAYS  Final   Report Status 05/26/2016 FINAL  Final  Blood culture (routine x 2)     Status: None   Collection Time: 05/21/16  2:36 PM  Result Value Ref Range Status   Specimen Description BLOOD RIGHT ANTECUBITAL  Final   Special Requests BOTTLES DRAWN AEROBIC ONLY 10CC  Final   Culture NO GROWTH 5 DAYS  Final   Report Status 05/26/2016 FINAL  Final  Urine culture     Status: Abnormal   Collection Time: 05/21/16  4:25 PM  Result Value Ref Range Status   Specimen Description URINE, CATHETERIZED  Final   Special Requests NONE  Final   Culture MULTIPLE SPECIES PRESENT, SUGGEST RECOLLECTION (A)  Final   Report Status 05/23/2016 FINAL  Final         Radiology Studies: Dg Swallowing Func-speech Pathology  Result Date: 05/26/2016 Objective Swallowing Evaluation: Type of Study: MBS-Modified Barium Swallow Study Patient Details Name: Rebecca Knight MRN: [id number] Date of Birth: [birth year] Today's Date: 05/26/2016 Time: SLP Start Time (ACUTE ONLY): 0845-SLP Stop Time (ACUTE ONLY): 0901 SLP Time Calculation (min) (ACUTE ONLY): 16 min Past Medical History: Past Medical History:  Diagnosis Date . AAA (abdominal aortic aneurysm) (HCC)   [name] . Anxiety  . Arthritis  . Calculus of gallbladder without mention of cholecystitis or obstruction  . Cancer [facility])   Colon and Rectal . Diverticulosis of colon  . Heart murmur  . Herpes zoster of eye 2002  Dr. Herbert Deaner . History of colon cancer 1995  Dr Lennie Hummer . Hyperlipidemia  . Hypertension  . Osteoporosis  . Post herpetic neuralgia  . Varicose veins  Past Surgical History: Past Surgical History: Procedure Laterality Date . COLON SURGERY    RESECTION in 1990s . ENDOVASCULAR STENT INSERTION  08/08/2011  Procedure: ENDOVASCULAR STENT GRAFT INSERTION;  Surgeon: [name], MD;  Location: [facility] OR;  Service: Vascular;  Laterality: N/A;  Endo Vascular Aortic stent placement with gore . EYE SURGERY    CAT EXT  OU . HIP ARTHROPLASTY Right 04/19/2016  Procedure: ARTHROPLASTY BIPOLAR HIP (HEMIARTHROPLASTY);  Surgeon: [name], MD;  Location: [place];  Service: Orthopedics;  Laterality: Right; . HIP ARTHROPLASTY Right 04/20/2016  Procedure: REVISION ARTHROPLASTY BIPOLAR HIP (HEMIARTHROPLASTY);  Surgeon: [name], MD;  Location: [place];  Service: Orthopedics;  Laterality: Right; . HIP CLOSED REDUCTION Right 04/19/2016  Procedure: CLOSED MANIPULATION HIP;  Surgeon: [name], MD;  Location: [place];  Service: Orthopedics;  Laterality: Right; . TONSILLECTOMY   HPI: 83 yo female with dementia and history of cva, from SNF, presents with progressive worsening mentation for several days, associated with poor oral intake. Recent hospitalization on 12/29 for hip hemiarthroplasty. Admitted 05/21/16 with sepsis due to UTI, acute encephalopathy, dehydrated with leukocytosis. Seen for cognitive-linguistic evaluation s/p left internal capsule CVA with no follow-up indicated. No prior history of dysphagia per chart. Seen today for bedside swallowing evaluation.  Subjective: "how did y'all find me?" Assessment / Plan / Recommendation CHL IP CLINICAL IMPRESSIONS  05/26/2016 Therapy Diagnosis Moderate oral phase dysphagia;Mild pharyngeal phase dysphagia;Severe oral phase dysphagia Clinical Impression Patient presents with moderate-severe oral and mild pharyngeal dysphagia; with intermittent laryngeal penetration of thin liquids but no frank aspiration observed. Deficits are primarily oral; patient with oral holding, weak lingual manipulation, reduced coordination and lingual pumping with all consistencies.  Required significant oral transit time. Pharyngeal stage of swallowing is characterized by moderately reduced tongue base retraction and pharyngeal constriction, leading to mild residue with thin and nectar-thick liquids (valleculae and pyriform) and moderate residue with pureed solids (valleculae). Intermittent penetration of thin liquid residue during and after the swallow with larger straw bolus due to swallow delay and pharyngeal residue. No penetration noted with teaspoons, small cup sips of thin liquids. Patient clears residue with cued dry swallow. Administered barium pill whole in applesauce; patient masticated tablet and required >1 min to manipulate bolus and initiate a swallow. Given increased work of eating with solids, recommend dysphagia 1 with thin liquids via teaspoon or small cup sips, medications crushed in puree. Patient will likely require assistance for feeding; she benefits from cued dry swallow to reduce pharyngeal residue. Impact on safety and function Mild aspiration risk;Risk for inadequate nutrition/hydration   CHL IP TREATMENT RECOMMENDATION 05/26/2016 Treatment Recommendations Therapy as outlined in treatment plan below   Prognosis 05/26/2016 Prognosis for Safe Diet Advancement Fair Barriers to Reach Goals Cognitive deficits;Time post onset Barriers/Prognosis Comment -- CHL IP DIET RECOMMENDATION 05/26/2016 SLP Diet Recommendations Dysphagia 1 (Puree) solids;Thin liquid Liquid Administration via Spoon;Cup;No straw Medication Administration Crushed with  puree Compensations Slow rate;Small sips/bites;Minimize environmental distractions Postural Changes Seated upright at 90 degrees   CHL IP OTHER RECOMMENDATIONS 05/26/2016 Recommended Consults -- Oral Care Recommendations Oral care BID Other Recommendations --   CHL IP FOLLOW UP RECOMMENDATIONS 05/26/2016 Follow up Recommendations Skilled Nursing facility   [facility] IP FREQUENCY AND DURATION 05/26/2016 Speech Therapy Frequency (ACUTE ONLY) min 2x/week Treatment Duration 2 weeks      CHL IP ORAL PHASE 05/26/2016 Oral Phase Impaired Oral - Pudding Teaspoon -- Oral - Pudding Cup -- Oral - Honey Teaspoon -- Oral - Honey Cup -- Oral - Nectar Teaspoon Weak lingual manipulation;Lingual pumping;Reduced posterior propulsion;Holding of bolus;Delayed oral transit Oral - Nectar Cup -- Oral - Nectar Straw Weak lingual manipulation;Lingual pumping;Reduced posterior propulsion;Holding of bolus;Premature spillage;Piecemeal swallowing Oral - Thin Teaspoon Weak lingual manipulation;Lingual pumping;Holding of bolus;Reduced posterior propulsion;Delayed oral transit Oral - Thin Cup Weak lingual manipulation;Lingual pumping;Decreased bolus cohesion;Premature spillage;Piecemeal swallowing;Reduced posterior propulsion Oral - Thin Straw Weak lingual manipulation;Lingual pumping;Holding of bolus;Reduced posterior propulsion;Premature spillage;Piecemeal swallowing;Decreased bolus cohesion Oral - Puree Lingual pumping;Weak lingual manipulation;Reduced posterior propulsion;Holding of bolus;Piecemeal swallowing;Decreased bolus cohesion;Delayed oral transit Oral - Mech Soft -- Oral - Regular -- Oral - Multi-Consistency -- Oral - Pill Weak lingual manipulation;Lingual pumping;Impaired mastication;Reduced posterior propulsion;Holding of bolus;Lingual/palatal residue;Piecemeal swallowing;Decreased bolus cohesion;Delayed oral transit Oral Phase - Comment --  CHL IP PHARYNGEAL PHASE 05/26/2016 Pharyngeal Phase Impaired Pharyngeal- Pudding Teaspoon -- Pharyngeal --  Pharyngeal- Pudding Cup -- Pharyngeal -- Pharyngeal- Honey Teaspoon -- Pharyngeal -- Pharyngeal- Honey Cup -- Pharyngeal -- Pharyngeal- Nectar Teaspoon Pharyngeal residue - valleculae;Reduced tongue base retraction;Reduced pharyngeal peristalsis;Delayed swallow initiation-vallecula Pharyngeal -- Pharyngeal- Nectar Cup -- Pharyngeal -- Pharyngeal- Nectar Straw Delayed swallow initiation-pyriform sinuses;Reduced pharyngeal peristalsis;Reduced tongue base retraction;Pharyngeal residue - valleculae;Pharyngeal residue - pyriform Pharyngeal -- Pharyngeal- Thin Teaspoon Reduced tongue base retraction;Pharyngeal residue - valleculae Pharyngeal -- Pharyngeal- Thin Cup Delayed swallow initiation-pyriform sinuses;Pharyngeal residue - pyriform;Pharyngeal residue - valleculae;Reduced pharyngeal peristalsis;Reduced tongue base retraction Pharyngeal -- Pharyngeal- Thin Straw Delayed swallow initiation-pyriform sinuses;Reduced pharyngeal peristalsis;Reduced tongue base retraction;Penetration/Apiration after swallow;Pharyngeal residue - pyriform;Pharyngeal residue - valleculae Pharyngeal Material enters airway, CONTACTS cords and not ejected out Pharyngeal- Puree Delayed swallow initiation-vallecula;Reduced pharyngeal peristalsis;Reduced tongue base retraction;Pharyngeal residue - valleculae Pharyngeal -- Pharyngeal- Mechanical Soft -- Pharyngeal -- Pharyngeal- Regular --  Pharyngeal -- Pharyngeal- Multi-consistency -- Pharyngeal -- Pharyngeal- Pill Delayed swallow initiation-vallecula;Reduced pharyngeal peristalsis;Reduced tongue base retraction;Pharyngeal residue - valleculae Pharyngeal -- Pharyngeal Comment --  CHL IP CERVICAL ESOPHAGEAL PHASE 05/26/2016 Cervical Esophageal Phase WFL Pudding Teaspoon -- Pudding Cup -- Honey Teaspoon -- Honey Cup -- Nectar Teaspoon -- Nectar Cup -- Nectar Straw -- Thin Teaspoon -- Thin Cup -- Thin Straw -- Puree -- Mechanical Soft -- Regular -- Multi-consistency -- Pill -- Cervical Esophageal Comment  -- No flowsheet data found. Aliene Altes 05/26/2016, 1:47 PM  Deneise Lever, MS CF-SLP Speech-Language Pathologist [phone]                 Scheduled Meds: . aspirin EC  81 mg Oral Daily  . feeding supplement  1 Container Oral TID BM  . gabapentin  100 mg Oral BID  . heparin  5,000 Units Subcutaneous Q8H  . metoprolol succinate  25 mg Oral Daily  . mirtazapine  7.5 mg Oral QHS  . polyvinyl alcohol  1 drop Both Eyes QID  . potassium chloride (KCL MULTIRUN) 30 mEq in 265 mL IVPB  30 mEq Intravenous Once  . pravastatin  20 mg Oral q1800  . sodium chloride flush  3 mL Intravenous Q12H   Continuous Infusions: . dextrose 500 mL (05/27/16 1316)     LOS: 6 days    Time spent:> 35 min   Velvet Bathe, MD [facility] Pager [phone]  If 7PM-7AM, please contact night-coverage [email or web address] Password [facility] 05/27/2016, 4:43 PM

## 2016-05-27 NOTE — Care Management Important Message (Signed)
Important Message  Patient Details  Name: Rebecca Knight MRN: [id number] Date of Birth: 08/08/1922   Medicare Important Message Given:  Yes    Rebecca Knight [name] 05/27/2016, 4:46 PM

## 2016-05-27 NOTE — Progress Notes (Signed)
Speech Language Pathology Treatment: Dysphagia  Patient Details Name: Rebecca Knight MRN: [id number] DOB: 07/31/1922 Today's Date: 05/27/2016 Time: [id number] SLP Time Calculation (min) (ACUTE ONLY): 21 min  Assessment / Plan / Recommendation Clinical Impression  Rebecca Knight complained of xerostomia prompting SLP to provide oral care prior to skilled observation of pt with POs. Feeding set up and assist was needed throughout dysphagia treatment. Administered trials of dysphagia 1 (puree) solids that resulted in prolonged mastication and prolonged bolus formation with oral residue and audible swallows were noted during trials of thin liquids. Rebecca Knight did not exhibit any overt s/s of aspiration at bedside. Educated pt re: diet recommendations and plan for ST to f/u briefly for treatment. Recommend continuing with current diet: dysphagia 1, thin liquids (no straws), meds crushed in puree.   HPI HPI: 83 yo female with dementia and history of cva, from SNF, presents with progressive worsening mentation for several days, associated with poor oral intake. Recent hospitalization on 12/29 for hip hemiarthroplasty. Admitted 05/21/16 with sepsis due to UTI, acute encephalopathy, dehydrated with leukocytosis. Seen for cognitive-linguistic evaluation s/p left internal capsule CVA with no follow-up indicated. No prior history of dysphagia per chart. MBS indicated mod-severe oral and mild pharyngeal dysphagia. Diet D1, thin liquids.      SLP Plan  Continue with current plan of care     Recommendations  Diet recommendations: Dysphagia 1 (puree);Thin liquid Liquids provided via: Cup;No straw Medication Administration: Crushed with puree Supervision: Staff to assist with self feeding;Intermittent supervision to cue for compensatory strategies Compensations: Minimize environmental distractions;Slow rate;Small sips/bites Postural Changes and/or Swallow Maneuvers: Seated upright 90 degrees                Oral  Care Recommendations: Oral care BID Follow up Recommendations: Skilled Nursing facility Plan: Continue with current plan of care       [place] , [place] 05/27/2016, 9:01 AM

## 2016-05-27 NOTE — Progress Notes (Addendum)
Son and dtr-n-law asked about swollen area in mouth--upper gums in R side.  Dtr-n-law states she noticed the area about 4 days ago. She states it does look smaller today than it did. Both upper molar gums without teeth, irregular shaped, R>L in size. Pt denies pain. Family feels this was affecting her eating before coming in.   Family asking MD to check areas and advise them  If areas are abscessed or what.  Will send message to Dr [facility].

## 2016-05-28 DIAGNOSIS — F0390 Unspecified dementia without behavioral disturbance: Secondary | ICD-10-CM

## 2016-05-28 LAB — POTASSIUM: POTASSIUM: 3.5 mmol/L (ref 3.5–5.1)

## 2016-05-28 MED ORDER — ADULT MULTIVITAMIN W/MINERALS CH: 1.0000 | ORAL_TABLET | Freq: Every day | ORAL | Status: DC

## 2016-05-28 MED ORDER — METOPROLOL SUCCINATE ER 25 MG PO TB24: 25.0000 mg | ORAL_TABLET | Freq: Every day | ORAL | 0 refills | Status: AC

## 2016-05-28 MED ORDER — ENSURE ENLIVE PO LIQD
237.0000 mL | Freq: Three times a day (TID) | ORAL | Status: DC
  Administered 2016-05-28: 237 mL via ORAL

## 2016-05-28 NOTE — Progress Notes (Signed)
Physical Therapy Treatment Patient Details Name: Rebecca Knight MRN: [id number] DOB: 14-Aug-1922 Today's Date: 05/28/2016    History of Present Illness Patient is a 83 yo female admitted 05/11/16 from [facility] SNF with sepsis due to UTI, encephalopathy, AKI.    PMH:  s/p Rt hemiarthroplasty 04/19/17, dislocation and revision 04/20/17;  dementia, HTN, HLD, CVA, anxiety    PT Comments    Pt showed improvements today with ambulation but still requires heavy assistance due to overall general weakness. Pt expressed that she wants to be able to walk more and is frustrated that she isn't progressing like she hoped. SPTA encouraged her that she is progressing but is still limited due to fear and weakness in LE's. Educated pt and family on benefits of getting OOB and walking. Pt will continue to benefit from PT to improve strength and functional independence during stay in preparation for d/c to SNF.    Follow Up Recommendations  SNF;Supervision/Assistance - 24 hour     Equipment Recommendations  None recommended by PT    Recommendations for Other Services       Precautions / Restrictions Precautions Precautions: Posterior Hip;Fall Restrictions Weight Bearing Restrictions: Yes Other Position/Activity Restrictions: WBAT RLE     Mobility  Bed Mobility Overal bed mobility: Needs Assistance Bed Mobility: Supine to Sit     Supine to sit: Mod assist     General bed mobility comments: Pt able to initiate LE movement but unable to independently get to EOB. Pt needs increased time, mod A + use of rail to get LE and trunk to upright position. Required cuing and mod A with chux pads to scoot to EOB.  Transfers Overall transfer level: Needs assistance Equipment used: Rolling walker (2 wheeled) Transfers: Sit to/from Stand Sit to Stand: Max assist;+2 physical assistance         General transfer comment: Pt required Max A +2 to boost into standing due to overall  weakness  Ambulation/Gait Ambulation/Gait assistance: Max assist;+2 physical assistance Ambulation Distance (Feet): 12 Feet Assistive device: Rolling walker (2 wheeled) Gait Pattern/deviations: Narrow base of support;Trunk flexed;Decreased stride length;Step-to pattern Gait velocity: very slow and fatiguing.    General Gait Details: Pt required Max A +2 for step sequencing and widening BOS. Verbal and tactile cuing at hips for upright posture and to extend knees. Pt unable to execute commands during ambulation by SPTA/PTA.    Stairs            Wheelchair Mobility    Modified Rankin (Stroke Patients Only)       Balance Overall balance assessment: History of Falls;Needs assistance Sitting-balance support: Single extremity supported;Feet supported Sitting balance-Leahy Scale: Poor Sitting balance - Comments: Pt able to sit statically for brief period of time; unable to correct posterior lean without min A from SPTA. Postural control: Posterior lean Standing balance support: Bilateral upper extremity supported;During functional activity Standing balance-Leahy Scale: Poor Standing balance comment: Static = poor, dynamic = zero due to fatigue; required heavy cuing for upright posture.                     Cognition Arousal/Alertness: Awake/alert Behavior During Therapy: WFL for tasks assessed/performed Overall Cognitive Status: Within Functional Limits for tasks assessed                      Exercises      General Comments        Pertinent Vitals/Pain Faces Pain Scale: Hurts a little bit  Pain Location: stomach Pain Descriptors / Indicators: Discomfort Pain Intervention(s): Monitored during session;Repositioned    Home Living                      Prior Function            PT Goals (current goals can now be found in the care plan section) Acute Rehab PT Goals Patient Stated Goal: wants to be able to walk better PT Goal Formulation: With  patient/family Potential to Achieve Goals: Good Progress towards PT goals: Progressing toward goals    Frequency    Min 3X/week      PT Plan Current plan remains appropriate    Co-evaluation             End of Session Equipment Utilized During Treatment: Gait belt Activity Tolerance: Patient limited by fatigue;Patient tolerated treatment well Patient left: with call bell/phone within reach;in chair;with chair alarm set;with family/visitor present     Time: [id number] PT Time Calculation (min) (ACUTE ONLY): 25 min  Charges:  $Gait Training: 8-22 mins $Therapeutic Activity: 8-22 mins                    G Codes:      [facility] 05-30-2016, 2:34 PM [place]a [place], [place] Pager [phone]

## 2016-05-28 NOTE — Progress Notes (Addendum)
Nutrition Follow-up  DOCUMENTATION CODES:   Not applicable  INTERVENTION:   -Ensure Enlive po TID, each supplement provides 350 kcal and 20 grams of protein -MVI daily  NUTRITION DIAGNOSIS:   Increased nutrient needs related to acute illness, wound healing as evidenced by estimated needs.  Ongoing  GOAL:   Patient will meet greater than or equal to 90% of their needs  Unmet  MONITOR:   PO intake, Supplement acceptance, Labs, Weight trends, Skin, I & O's  REASON FOR ASSESSMENT:   Low Br[place]    ASSESSMENT:   83 yo Female admitted 05/11/16 from [facility] with sepsis due to UTI, encephalopathy, AKI.  PMH:  s/p Rt hemiarthroplasty 04/19/17, dislocation and revision 04/20/17;  dementia, HTN, HLD, CVA, anxiety.  2/3- s/p BSE; recommended dysphagia 1 diet with honey thick liquids 2/4- s/p MBSS; upgraded to dysphagia 1 diet with thin liquids  Spoke with pt at bedside, who reports she feels horrible. Noted breakfast tray in front of pt. Pt consumed 25% of eggs and Magic Cup. Pt reports her appetite is not good, however, has improved since admission. Meal completion documented at 35%.   Pt not very talkative at time of visit. When asked if pt liked the Magic Cup, but reported that it was "okay" but stated she would not eat it if it was offered to her again. Pt with increased nutritional needs due to skin breakdown.    Nutrition-Focused physical exam completed. Findings are moderate to severe fat depletion, moderate to severe muscle depletion, andno edema. Difficult to determine if pt meets criteria for malnutrition based upon exam- suspect muscle and fat depletion is multi-factorial and likely contributing to advanced age and limited mobility.   Labs reviewed: K: 3.2  Diet Order:  DIET - DYS 1 Room service appropriate? Yes; Fluid consistency: Thin Diet - low sodium heart healthy  Skin:  Wound (see comment) (stage I sacrum, DTI bilateral heels)  Last BM:   05/26/16  Height:   Ht Readings from Last 1 Encounters:  05/22/16 5\' 1"  (1.549 m)    Weight:   Wt Readings from Last 1 Encounters:  05/22/16 115 lb (52.2 kg)    Ideal Body Weight:  47.7 kg  BMI:  Body mass index is 21.73 kg/m.  Estimated Nutritional Needs:   Kcal:  1200-1400  Protein:  60-70 gm  Fluid:  >/= 1.5 L  EDUCATION NEEDS:   No education needs identified at this time  Kenleigh Toback A. Jimmye Norman, RD, LDN, CDE Pager: [phone] After hours Pager: [phone]

## 2016-05-28 NOTE — Discharge Summary (Signed)
Physician Discharge Summary  RAIN FLIEGEL [id number] DOB: 05-Jan-1923 DOA: 05/21/2016  PCP: [name], DO  Admit date: 05/21/2016 Discharge date: 05/28/2016  Time spent: > 35 minutes  Recommendations for Outpatient Follow-up:  1. Monitor blood pressures 2. Reassess potassium levels   Discharge Diagnoses:  Principal Problem:   Sepsis ([place]) Active Problems:   HYPERLIPIDEMIA   Essential hypertension   Acute encephalopathy   UTI (urinary tract infection)   Acute kidney injury ([place])   Dehydration, severe   History of right hip hemiarthroplasty 04/19/16   Dementia   Complicated UTI (urinary tract infection)   Pressure injury of skin   Hypokalemia   Hypernatremia   Sepsis secondary to UTI [facility])   Dysphagia   Metabolic encephalopathy   Dyslipidemia   Discharge Condition: stable  Diet recommendation: dysphagia 1 diet  Filed Weights   05/22/16 1159  Weight: 52.2 kg (115 lb)    History of present illness:  83 yo WF from SNF PMHx Dementia, Anxiety, CVA, AAA ,Heart murmur, HTN, HLD, Colon/Rectal cancer, Post herpetic neuralgia  Presents with progressive worsening mentation for several days, associated with poor oral intake. Recent hospitalization on 12/29 for hip hemiarthroplasty. Noted dehydrated with leukocytosis. Transferred to to hospital for further management.   Most likely secondary to infectious etiology  Hospital Course:  Sepsis due to UTI, present on admission. -UA-turbid, many bacteria, numerous WBC, -culture-polymicrobial -Completed 5 day course antibiotics  - sepsis resolved   Acute metabolic encephalopathy -Likely secondary to urinary tract infection and dehydration. -Resolved  Hypokalemia - resolved  Dementia -with someconfusion and disorientation, no agitation.  -stable   Dysphagia -2/4 MBS: Patient now on Dysphagia 1 diet  Hypernatremia -Resolved  HTN. -Decrease Toprol to 25 mg daily. Goal SBP 145 given patient's age and risk  for fall  AKI due to dehydration - resolved  Procedures:  None  Consultations:  None  Discharge Exam: Vitals:   05/27/16 2306 05/28/16 0425  BP: (!) 148/89 (!) 166/95  Pulse: 94 72  Resp: 18 16  Temp: 97.9 F (36.6 C) 98.3 F (36.8 C)    General: Pt in nad, alert and awake Cardiovascular: rrr, no rubs Respiratory: no increased wob, no wheezes  Discharge Instructions   Discharge Instructions    Call MD for:  extreme fatigue    Complete by:  As directed    Call MD for:  redness, tenderness, or signs of infection (pain, swelling, redness, odor or green/yellow discharge around incision site)    Complete by:  As directed    Call MD for:  temperature >100.4    Complete by:  As directed    Diet - low sodium heart healthy    Complete by:  As directed    Discharge instructions    Complete by:  As directed    Please ensure patient follows up with dentist as outpatient.   Increase activity slowly    Complete by:  As directed      Current Discharge Medication List    CONTINUE these medications which have CHANGED   Details  metoprolol succinate (TOPROL-XL) 25 MG 24 hr tablet Take 1 tablet (25 mg total) by mouth daily. Qty: 30 tablet, Refills: 0      CONTINUE these medications which have NOT CHANGED   Details  acetaminophen (TYLENOL) 325 MG tablet Take 2 tablets (650 mg total) by mouth every 6 (six) hours as needed for mild pain (or Fever >/= 101).    aspirin EC 81 MG tablet Take  81 mg by mouth daily.    !! gabapentin (NEURONTIN) 100 MG capsule Take 100 mg by mouth 2 (two) times daily.     !! gabapentin (NEURONTIN) 300 MG capsule Refills: 3    losartan (COZAAR) 100 MG tablet Take 1 tablet by mouth daily    magnesium hydroxide (MILK OF MAGNESIA) 400 MG/5ML suspension Take 30 mLs by mouth daily as needed for mild constipation.    mirtazapine (REMERON SOL-TAB) 15 MG disintegrating tablet Take 7.5 mg by mouth at bedtime.    Polyethyl Glycol-Propyl Glycol  (SYSTANE) 0.4-0.3 % SOLN Apply 1 drop to eye 4 (four) times daily. Reported on 05/13/2015    pravastatin (PRAVACHOL) 20 MG tablet 1 BY MOUTH DAILY Qty: 90 tablet, Refills: 1    senna (SENOKOT) 8.6 MG tablet Take 1 tablet (8.6 mg total) by mouth 2 (two) times daily as needed for constipation.     !! - Potential duplicate medications found. Please discuss with provider.    STOP taking these medications     HYDROcodone-acetaminophen (NORCO/VICODIN) 5-325 MG tablet        Allergies  Allergen Reactions  . Penicillins Rash    Has patient had a PCN reaction causing immediate rash, facial/tongue/throat swelling, SOB or lightheadedness with hypotension: Yes Has patient had a PCN reaction causing severe rash involving mucus membranes or skin necrosis: Yes Has patient had a PCN reaction that required hospitalization No Has patient had a PCN reaction occurring within the last 10 years: No If all of the above answers are "NO", then may proceed with Cephalosporin use.     Contact information for after-discharge care    Destination    [facility] .   Specialty:  [place] information: [address] [place] [phone]               The results of significant diagnostics from this hospitalization (including imaging, microbiology, ancillary and laboratory) are listed below for reference.    Significant Diagnostic Studies: Dg Chest 2 View  Result Date: 05/21/2016 CLINICAL DATA:  Weakness, shortness of breath. EXAM: CHEST  2 VIEW COMPARISON:  Radiograph of April 18, 2016. FINDINGS: Stable cardiomediastinal silhouette. Atherosclerosis thoracic aorta is noted. No pneumothorax or pleural effusion is noted. No acute pulmonary disease is noted. Bony thorax is unremarkable. IMPRESSION: No active cardiopulmonary disease.  Aortic atherosclerosis. Electronically Signed   By: Marijo Conception, M.D.   On: 05/21/2016  14:05   Ct Head Wo Contrast  Result Date: 05/21/2016 CLINICAL DATA:  83 year old female with lethargy. No injury. Hypertension. Initial encounter. EXAM: CT HEAD WITHOUT CONTRAST TECHNIQUE: Contiguous axial images were obtained from the base of the skull through the vertex without intravenous contrast. COMPARISON:  04/18/2016 CT.  12/25/2014 MR. FINDINGS: Brain: No intracranial hemorrhage or CT evidence of large acute infarct. Remote left external capsule/ caudate infarct. Remote right lenticular nucleus infarct. Prominent chronic microvascular changes. Moderate global atrophy without hydrocephalus. No intracranial mass lesion noted on this unenhanced exam. Vascular: Vascular calcifications. Skull: Negative. Sinuses/Orbits: Post lens replacement. No acute orbital abnormality. Visualized paranasal sinuses, mastoid air cells and middle ear cavities are clear. Other: Negative IMPRESSION: No acute intracranial abnormality noted. Remote left external capsule/ caudate infarct. Remote right lenticular nucleus infarct. Prominent chronic microvascular changes. Moderate global atrophy. Electronically Signed   By: Genia Del M.D.   On: 05/21/2016 13:41   Dg Swallowing Func-speech Pathology  Result Date: 05/26/2016 Objective Swallowing Evaluation: Type of Study: MBS-Modified Barium  [place]ondo Study Patient Details Name: Rebecca Knight MRN: [id number] Date of Birth: [birth year] Today's Date: 05/26/2016 Time: SLP Start Time (ACUTE ONLY): 0845-SLP Stop Time (ACUTE ONLY): 0901 SLP Time Calculation (min) (ACUTE ONLY): 16 min Past Medical History: Past Medical History: Diagnosis Date . AAA (abdominal aortic aneurysm) (HCC)   [name] . Anxiety  . Arthritis  . Calculus of gallbladder without mention of cholecystitis or obstruction  . Cancer [facility])   Colon and Rectal . Diverticulosis of colon  . Heart murmur  . Herpes zoster of eye 2002  Dr. Herbert Deaner . History of colon cancer 1995  Dr Lennie Hummer . Hyperlipidemia  . Hypertension  .  Osteoporosis  . Post herpetic neuralgia  . Varicose veins  Past Surgical History: Past Surgical History: Procedure Laterality Date . COLON SURGERY    RESECTION in 1990s . ENDOVASCULAR STENT INSERTION  08/08/2011  Procedure: ENDOVASCULAR STENT GRAFT INSERTION;  Surgeon: [name], MD;  Location: [facility] OR;  Service: Vascular;  Laterality: N/A;  Endo Vascular Aortic stent placement with gore . EYE SURGERY    CAT EXT  OU . HIP ARTHROPLASTY Right 04/19/2016  Procedure: ARTHROPLASTY BIPOLAR HIP (HEMIARTHROPLASTY);  Surgeon: [name], MD;  Location: [place];  Service: Orthopedics;  Laterality: Right; . HIP ARTHROPLASTY Right 04/20/2016  Procedure: REVISION ARTHROPLASTY BIPOLAR HIP (HEMIARTHROPLASTY);  Surgeon: [name], MD;  Location: [place];  Service: Orthopedics;  Laterality: Right; . HIP CLOSED REDUCTION Right 04/19/2016  Procedure: CLOSED MANIPULATION HIP;  Surgeon: [name], MD;  Location: [place];  Service: Orthopedics;  Laterality: Right; . TONSILLECTOMY   HPI: 83 yo female with dementia and history of cva, from SNF, presents with progressive worsening mentation for several days, associated with poor oral intake. Recent hospitalization on 12/29 for hip hemiarthroplasty. Admitted 05/21/16 with sepsis due to UTI, acute encephalopathy, dehydrated with leukocytosis. Seen for cognitive-linguistic evaluation s/p left internal capsule CVA with no follow-up indicated. No prior history of dysphagia per chart. Seen today for bedside swallowing evaluation.  Subjective: "how did y'all find me?" Assessment / Plan / Recommendation CHL IP CLINICAL IMPRESSIONS 05/26/2016 Therapy Diagnosis Moderate oral phase dysphagia;Mild pharyngeal phase dysphagia;Severe oral phase dysphagia Clinical Impression Patient presents with moderate-severe oral and mild pharyngeal dysphagia; with intermittent laryngeal penetration of thin liquids but no frank aspiration observed. Deficits are primarily oral; patient with oral holding, weak  lingual manipulation, reduced coordination and lingual pumping with all consistencies. Required significant oral transit time. Pharyngeal stage of swallowing is characterized by moderately reduced tongue base retraction and pharyngeal constriction, leading to mild residue with thin and nectar-thick liquids (valleculae and pyriform) and moderate residue with pureed solids (valleculae). Intermittent penetration of thin liquid residue during and after the swallow with larger straw bolus due to swallow delay and pharyngeal residue. No penetration noted with teaspoons, small cup sips of thin liquids. Patient clears residue with cued dry swallow. Administered barium pill whole in applesauce; patient masticated tablet and required >1 min to manipulate bolus and initiate a swallow. Given increased work of eating with solids, recommend dysphagia 1 with thin liquids via teaspoon or small cup sips, medications crushed in puree. Patient will likely require assistance for feeding; she benefits from cued dry swallow to reduce pharyngeal residue. Impact on safety and function Mild aspiration risk;Risk for inadequate nutrition/hydration   CHL IP TREATMENT RECOMMENDATION 05/26/2016 Treatment Recommendations Therapy as outlined in treatment plan below   Prognosis 05/26/2016 Prognosis for Safe Diet Advancement Fair Barriers to Reach Goals Cognitive  deficits;Time post onset Barriers/Prognosis Comment -- CHL IP DIET RECOMMENDATION 05/26/2016 SLP Diet Recommendations Dysphagia 1 (Puree) solids;Thin liquid Liquid Administration via Spoon;Cup;No straw Medication Administration Crushed with puree Compensations Slow rate;Small sips/bites;Minimize environmental distractions Postural Changes Seated upright at 90 degrees   CHL IP OTHER RECOMMENDATIONS 05/26/2016 Recommended Consults -- Oral Care Recommendations Oral care BID Other Recommendations --   CHL IP FOLLOW UP RECOMMENDATIONS 05/26/2016 Follow up Recommendations Skilled Nursing facility   [facility] IP  FREQUENCY AND DURATION 05/26/2016 Speech Therapy Frequency (ACUTE ONLY) min 2x/week Treatment Duration 2 weeks      CHL IP ORAL PHASE 05/26/2016 Oral Phase Impaired Oral - Pudding Teaspoon -- Oral - Pudding Cup -- Oral - Honey Teaspoon -- Oral - Honey Cup -- Oral - Nectar Teaspoon Weak lingual manipulation;Lingual pumping;Reduced posterior propulsion;Holding of bolus;Delayed oral transit Oral - Nectar Cup -- Oral - Nectar Straw Weak lingual manipulation;Lingual pumping;Reduced posterior propulsion;Holding of bolus;Premature spillage;Piecemeal swallowing Oral - Thin Teaspoon Weak lingual manipulation;Lingual pumping;Holding of bolus;Reduced posterior propulsion;Delayed oral transit Oral - Thin Cup Weak lingual manipulation;Lingual pumping;Decreased bolus cohesion;Premature spillage;Piecemeal swallowing;Reduced posterior propulsion Oral - Thin Straw Weak lingual manipulation;Lingual pumping;Holding of bolus;Reduced posterior propulsion;Premature spillage;Piecemeal swallowing;Decreased bolus cohesion Oral - Puree Lingual pumping;Weak lingual manipulation;Reduced posterior propulsion;Holding of bolus;Piecemeal swallowing;Decreased bolus cohesion;Delayed oral transit Oral - Mech Soft -- Oral - Regular -- Oral - Multi-Consistency -- Oral - Pill Weak lingual manipulation;Lingual pumping;Impaired mastication;Reduced posterior propulsion;Holding of bolus;Lingual/palatal residue;Piecemeal swallowing;Decreased bolus cohesion;Delayed oral transit Oral Phase - Comment --  CHL IP PHARYNGEAL PHASE 05/26/2016 Pharyngeal Phase Impaired Pharyngeal- Pudding Teaspoon -- Pharyngeal -- Pharyngeal- Pudding Cup -- Pharyngeal -- Pharyngeal- Honey Teaspoon -- Pharyngeal -- Pharyngeal- Honey Cup -- Pharyngeal -- Pharyngeal- Nectar Teaspoon Pharyngeal residue - valleculae;Reduced tongue base retraction;Reduced pharyngeal peristalsis;Delayed swallow initiation-vallecula Pharyngeal -- Pharyngeal- Nectar Cup -- Pharyngeal -- Pharyngeal- Nectar Straw  Delayed swallow initiation-pyriform sinuses;Reduced pharyngeal peristalsis;Reduced tongue base retraction;Pharyngeal residue - valleculae;Pharyngeal residue - pyriform Pharyngeal -- Pharyngeal- Thin Teaspoon Reduced tongue base retraction;Pharyngeal residue - valleculae Pharyngeal -- Pharyngeal- Thin Cup Delayed swallow initiation-pyriform sinuses;Pharyngeal residue - pyriform;Pharyngeal residue - valleculae;Reduced pharyngeal peristalsis;Reduced tongue base retraction Pharyngeal -- Pharyngeal- Thin Straw Delayed swallow initiation-pyriform sinuses;Reduced pharyngeal peristalsis;Reduced tongue base retraction;Penetration/Apiration after swallow;Pharyngeal residue - pyriform;Pharyngeal residue - valleculae Pharyngeal Material enters airway, CONTACTS cords and not ejected out Pharyngeal- Puree Delayed swallow initiation-vallecula;Reduced pharyngeal peristalsis;Reduced tongue base retraction;Pharyngeal residue - valleculae Pharyngeal -- Pharyngeal- Mechanical Soft -- Pharyngeal -- Pharyngeal- Regular -- Pharyngeal -- Pharyngeal- Multi-consistency -- Pharyngeal -- Pharyngeal- Pill Delayed swallow initiation-vallecula;Reduced pharyngeal peristalsis;Reduced tongue base retraction;Pharyngeal residue - valleculae Pharyngeal -- Pharyngeal Comment --  CHL IP CERVICAL ESOPHAGEAL PHASE 05/26/2016 Cervical Esophageal Phase WFL Pudding Teaspoon -- Pudding Cup -- Honey Teaspoon -- Honey Cup -- Nectar Teaspoon -- Nectar Cup -- Nectar Straw -- Thin Teaspoon -- Thin Cup -- Thin Straw -- Puree -- Mechanical Soft -- Regular -- Multi-consistency -- Pill -- Cervical Esophageal Comment -- No flowsheet data found. Aliene Altes 05/26/2016, 1:47 PM  Deneise Lever, MS CF-SLP Speech-Language Pathologist [phone]            Xr Hip Unilat W Or W/o Pelvis 2-3 Views Right  Result Date: 04/30/2016 2 view radiographs of the right hip shows stable hemiarthroplasty. The cable for the calcar fracture is intact no signs of displacement. The joint is  congruent. The leg length is symmetric.   Microbiology: Recent Results (from the past 240 hour(s))  Blood culture (routine x 2)     Status: None  Collection Time: 05/21/16  1:17 PM  Result Value Ref Range Status   Specimen Description BLOOD LEFT FOREARM  Final   Special Requests BOTTLES DRAWN AEROBIC AND ANAEROBIC 5CC  Final   Culture NO GROWTH 5 DAYS  Final   Report Status 05/26/2016 FINAL  Final  Blood culture (routine x 2)     Status: None   Collection Time: 05/21/16  2:36 PM  Result Value Ref Range Status   Specimen Description BLOOD RIGHT ANTECUBITAL  Final   Special Requests BOTTLES DRAWN AEROBIC ONLY 10CC  Final   Culture NO GROWTH 5 DAYS  Final   Report Status 05/26/2016 FINAL  Final  Urine culture     Status: Abnormal   Collection Time: 05/21/16  4:25 PM  Result Value Ref Range Status   Specimen Description URINE, CATHETERIZED  Final   Special Requests NONE  Final   Culture MULTIPLE SPECIES PRESENT, SUGGEST RECOLLECTION (A)  Final   Report Status 05/23/2016 FINAL  Final     Labs: Basic Metabolic Panel:  Recent Labs Lab 05/23/16 0443 05/24/16 0745 05/25/16 0856 05/26/16 0919 05/27/16 1303 05/28/16 1100  NA 148* 144 142 134* 138  --   K 3.2* 3.0* 4.6 2.8* 3.2* 3.5  CL 119* 114* 116* 97* 102  --   CO2 22 24 21* 26 28  --   GLUCOSE 139* 125* 122* 226* 108*  --   BUN 44* 25* 19 10 8   --   CREATININE 0.90 0.77 0.78 0.68 0.69  --   CALCIUM 9.2 8.9 8.9 8.7* 9.0  --   MG  --  1.8  --  1.7  --   --    Liver Function Tests: No results for input(s): AST, ALT, ALKPHOS, BILITOT, PROT, ALBUMIN in the last 168 hours. No results for input(s): LIPASE, AMYLASE in the last 168 hours. No results for input(s): AMMONIA in the last 168 hours. CBC:  Recent Labs Lab 05/22/16 0758 05/23/16 0443 05/24/16 0745  WBC 20.2* 9.7 9.7  NEUTROABS  --  8.4*  --   HGB 10.5* 10.6* 10.3*  HCT 34.7* 35.4* 34.2*  MCV 87.8 87.4 86.6  PLT 287 222 231   Cardiac Enzymes: No results  for input(s): CKTOTAL, CKMB, CKMBINDEX, TROPONINI in the last 168 hours. BNP: BNP (last 3 results) No results for input(s): BNP in the last 8760 hours.  ProBNP (last 3 results) No results for input(s): PROBNP in the last 8760 hours.  CBG:  Recent Labs Lab 05/22/16 0919  GLUCAP 82   Signed:  Velvet Bathe MD.  [facility] 05/28/2016, 3:00 PM

## 2016-05-28 NOTE — Progress Notes (Addendum)
Patient will DC to: [facility]  Anticipated DC date: 05/28/16 Family notified: Son Transport by: Corey Harold 5pm (they said they were 4 hours behind)   Per MD patient ready for DC to [facility]. RN, patient, patient's family, and facility notified of DC. Discharge Summary sent to facility. RN given number for report. DC packet on chart. Ambulance transport requested for patient.   CSW signing off.  Cedric Fishman, [place] Social Worker [phone]

## 2016-07-17 ENCOUNTER — Ambulatory Visit: Payer: Medicare Other | Admitting: Neurology

## 2016-07-23 ENCOUNTER — Encounter: Payer: Self-pay | Admitting: Neurology

## 2016-09-20 DIAGNOSIS — I639 Cerebral infarction, unspecified: Secondary | ICD-10-CM

## 2016-09-20 HISTORY — DX: Cerebral infarction, unspecified: I63.9

## 2016-10-07 IMAGING — MR MR HEAD W/O CM
9 of 10 series · 35 of 48 positions shown · non-contrast
Comparison: CT head 12/23/2014

CLINICAL DATA: Acute encephalopathy. One week history of functional
decline

EXAM:
MRI HEAD WITHOUT CONTRAST
TECHNIQUE: Multiplanar, multiecho pulse sequences of the brain and surrounding
structures were obtained without intravenous contrast.

[Series 3: DWI · axial · 3.0mm · 0.94mm/px · z∈[-13,+125]mm · 8 of 94 slices shown (1 of 4)]
[im 1/94]
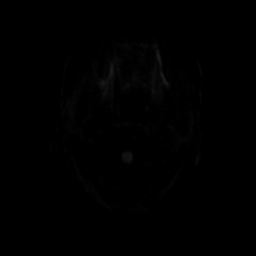
[im 11/94]
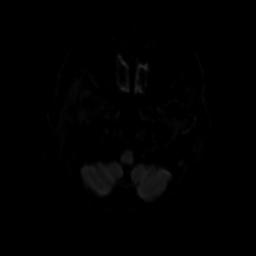
[im 32/94]
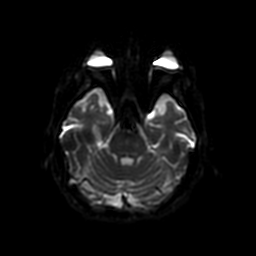
[im 42/94]
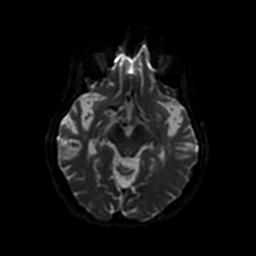
[im 52/94]
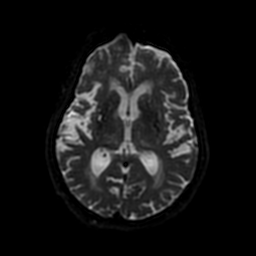
[im 63/94]
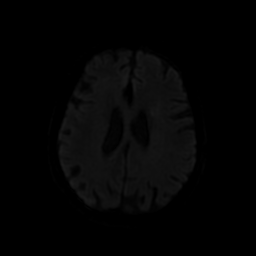
[im 83/94]
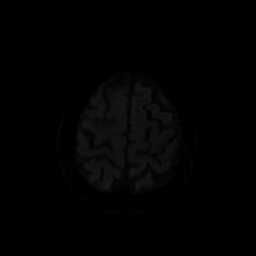
[im 94/94]
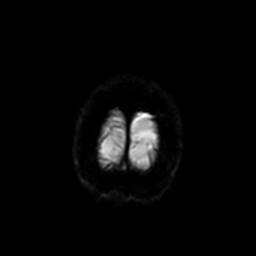

[Series 4: DWI · coronal · 5.0mm · 0.94mm/px · 6 of 65 slices shown (2 of 4)]
[im 1/65]
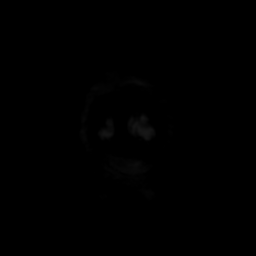
[im 13/65]
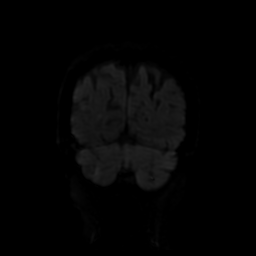
[im 26/65]
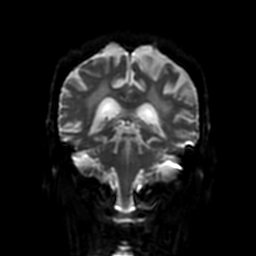
[im 39/65]
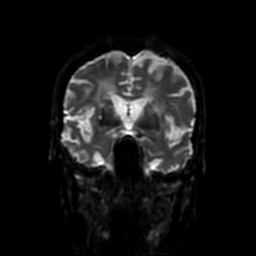
[im 52/65]
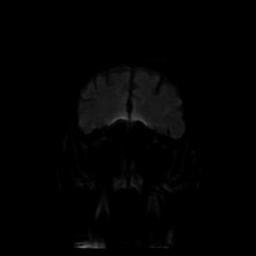
[im 65/65]
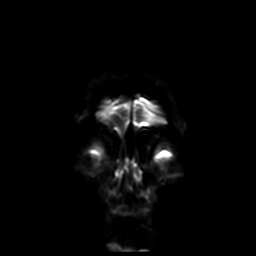

[Series 5: FLAIR · sagittal · 5.0mm · 0.47mm/px · 2 of 23 slices shown (1 of 2)]
[im 1/23]
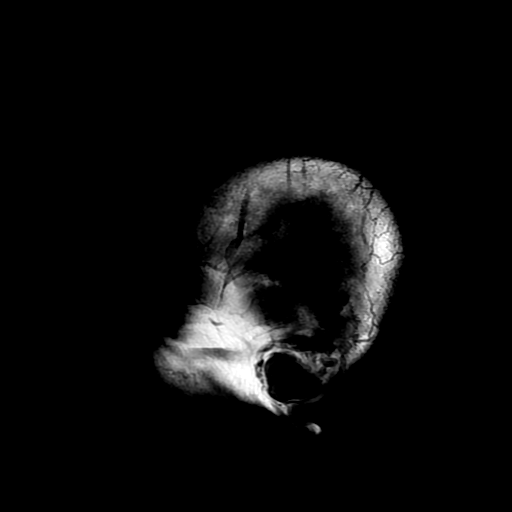
[im 23/23]
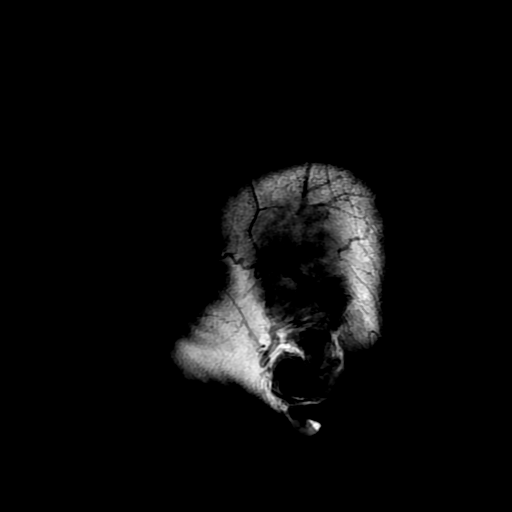

[Series 6: T2 · axial · 5.0mm · 0.47mm/px · z∈[-13,+125]mm · 2 of 24 slices shown (1 of 2)]
[im 1/24]
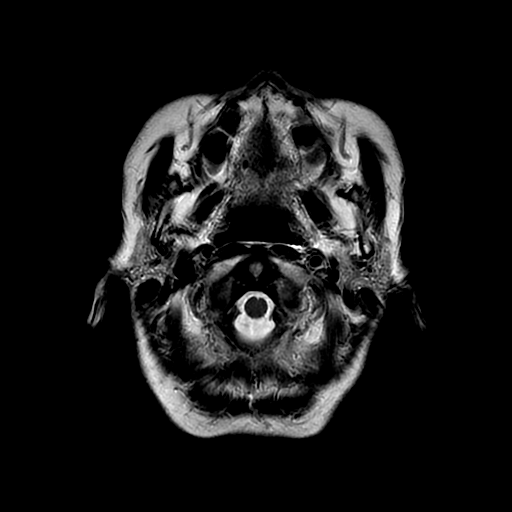
[im 24/24]
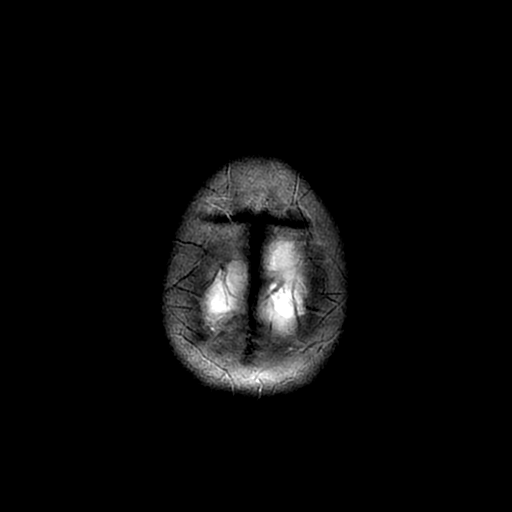

[Series 7: FLAIR · axial · 5.0mm · 0.47mm/px · z∈[-13,+125]mm · 2 of 24 slices shown (2 of 2)]
[im 1/24]
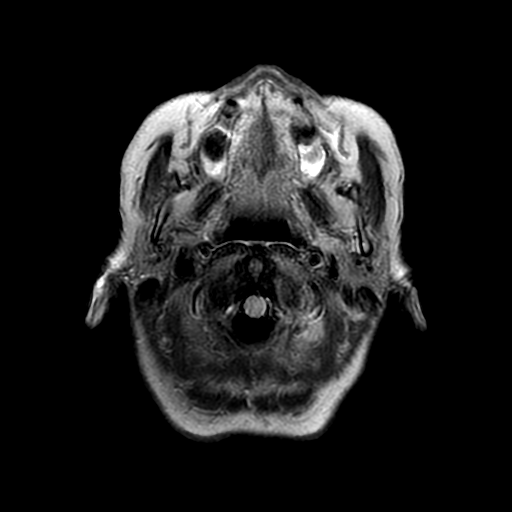
[im 24/24]
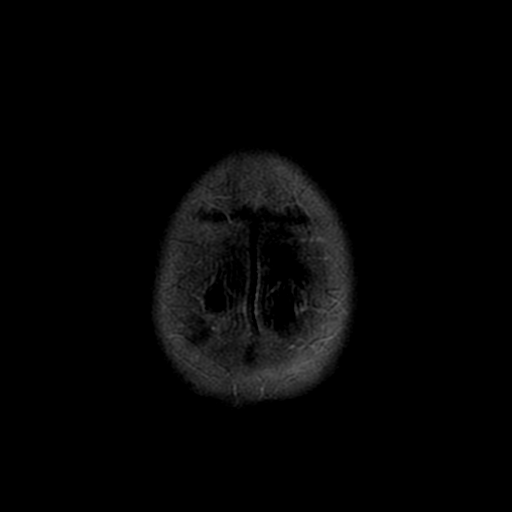

[Series 8: (person_name) · axial · 3.0mm · 0.47mm/px · z∈[-14,+49]mm · 4 of 96 slices shown]
[im 1/96]
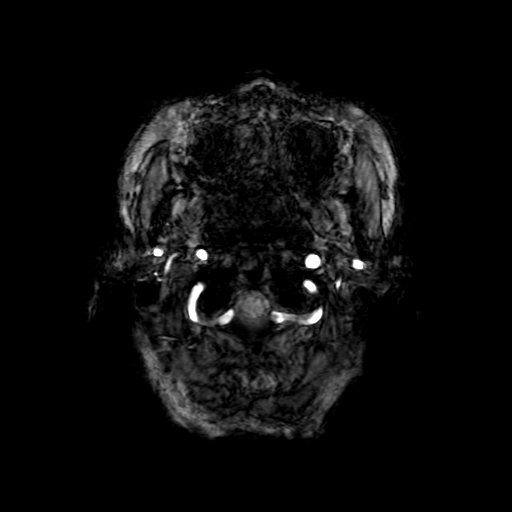
[im 11/96]
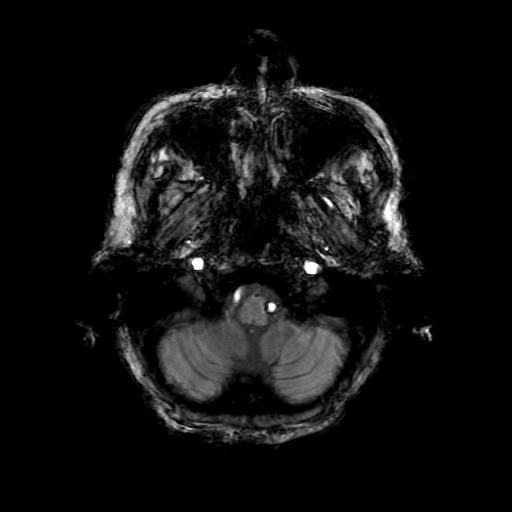
[im 32/96]
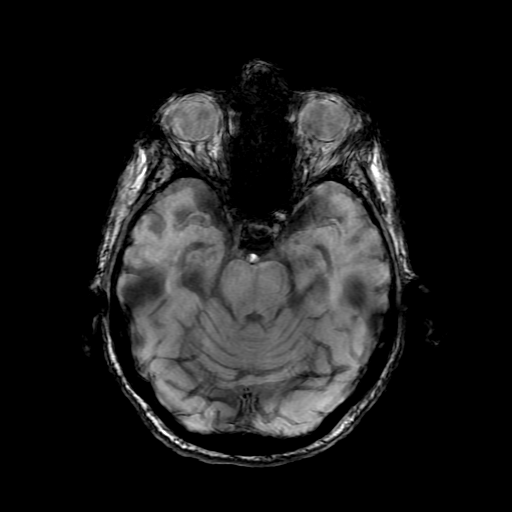
[im 43/96]
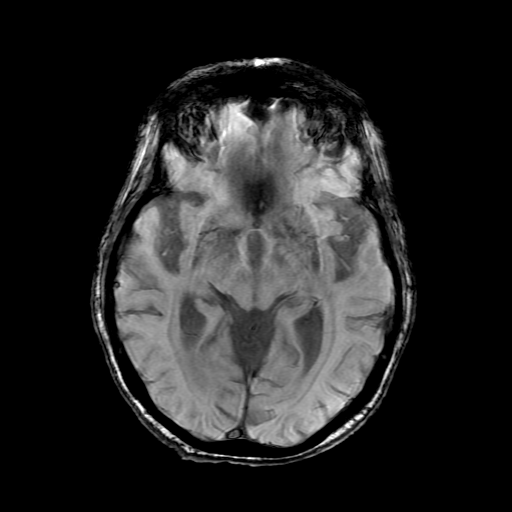

[Series 10: T2 · coronal · 5.0mm · 0.39mm/px · 3 of 28 slices shown (2 of 2)]
[im 1/28]
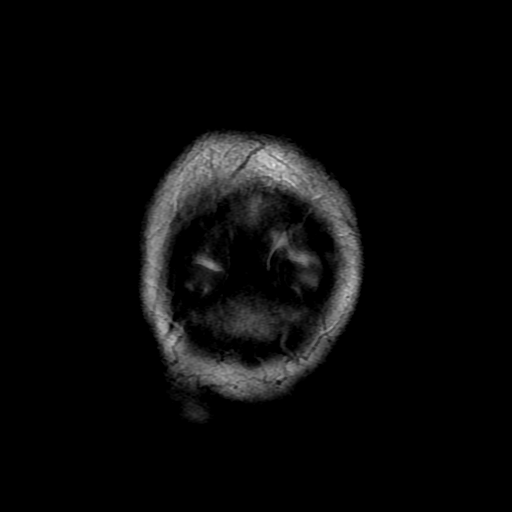
[im 14/28]
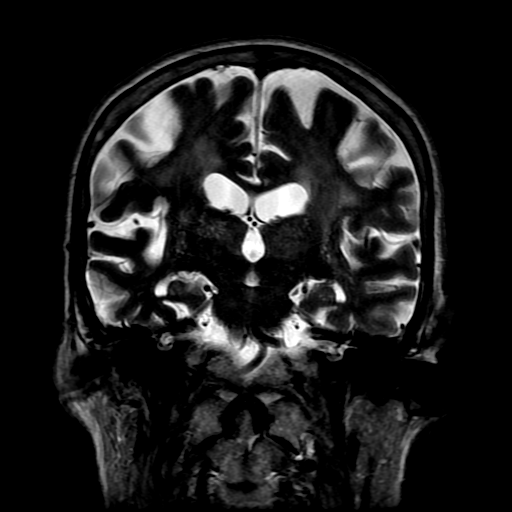
[im 28/28]
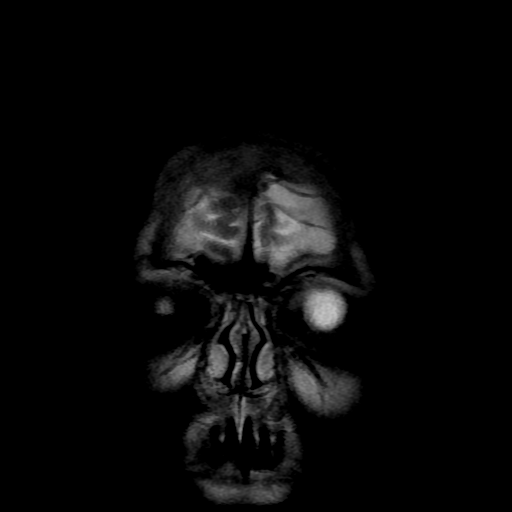

[Series 300: DWI · axial · 3.0mm · 0.94mm/px · z∈[-13,+125]mm · 5 of 46 slices shown (3 of 4)]
[im 1/46]
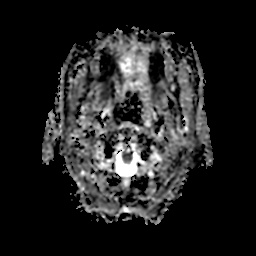
[im 12/46]
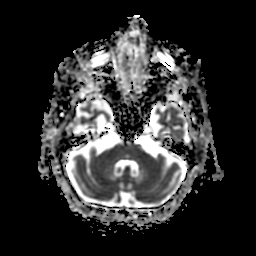
[im 23/46]
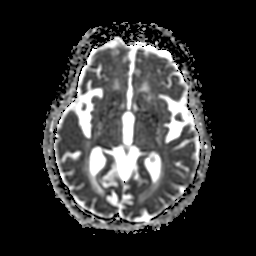
[im 34/46]
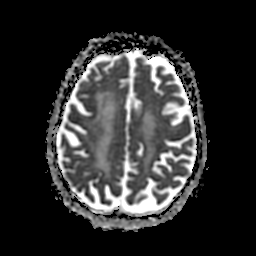
[im 46/46]
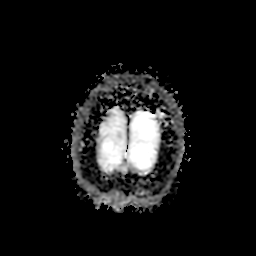

[Series 400: DWI · coronal · 5.0mm · 0.94mm/px · 3 of 33 slices shown (4 of 4)]
[im 1/33]
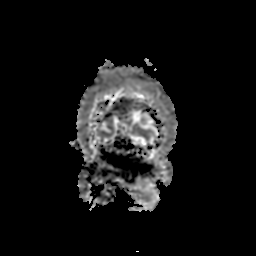
[im 17/33]
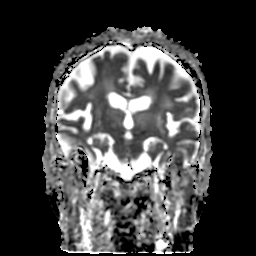
[im 33/33]
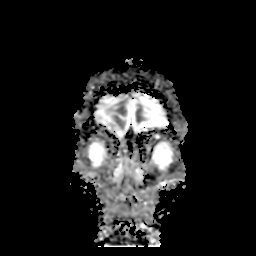

[35 of 48 positions shown; findings below may reference images not displayed]

FINDINGS: Acute infarct left external capsule.  No other acute infarct

Moderate to advanced chronic microvascular ischemic change
throughout the cerebral white matter and pons.

Negative for intracranial hemorrhage. Negative for mass or edema. No
shift of the midline structures. Negative for hydrocephalus.
IMPRESSION: Acute infarct left external capsule

Atrophy and extensive chronic microvascular ischemic change.

## 2016-10-15 ENCOUNTER — Emergency Department (HOSPITAL_COMMUNITY): Payer: Medicare Other

## 2016-10-15 ENCOUNTER — Encounter (HOSPITAL_COMMUNITY): Payer: Self-pay | Admitting: Emergency Medicine

## 2016-10-15 ENCOUNTER — Inpatient Hospital Stay (HOSPITAL_COMMUNITY)
Admission: EM | Admit: 2016-10-15 | Discharge: 2016-10-17 | DRG: 065 | Disposition: A | Discharge status: Skilled Nursing Facility | Payer: Medicare Other | Attending: Internal Medicine | Admitting: Internal Medicine

## 2016-10-15 DIAGNOSIS — Z96641 Presence of right artificial hip joint: Secondary | ICD-10-CM | POA: Diagnosis present

## 2016-10-15 DIAGNOSIS — Z8673 Personal history of transient ischemic attack (TIA), and cerebral infarction without residual deficits: Secondary | ICD-10-CM

## 2016-10-15 DIAGNOSIS — F419 Anxiety disorder, unspecified: Secondary | ICD-10-CM | POA: Diagnosis present

## 2016-10-15 DIAGNOSIS — L89152 Pressure ulcer of sacral region, stage 2: Secondary | ICD-10-CM | POA: Diagnosis present

## 2016-10-15 DIAGNOSIS — Z79899 Other long term (current) drug therapy: Secondary | ICD-10-CM | POA: Diagnosis not present

## 2016-10-15 DIAGNOSIS — E876 Hypokalemia: Secondary | ICD-10-CM | POA: Diagnosis not present

## 2016-10-15 DIAGNOSIS — G8194 Hemiplegia, unspecified affecting left nondominant side: Secondary | ICD-10-CM | POA: Diagnosis present

## 2016-10-15 DIAGNOSIS — Z66 Do not resuscitate: Secondary | ICD-10-CM | POA: Diagnosis present

## 2016-10-15 DIAGNOSIS — E782 Mixed hyperlipidemia: Secondary | ICD-10-CM | POA: Diagnosis present

## 2016-10-15 DIAGNOSIS — Z85038 Personal history of other malignant neoplasm of large intestine: Secondary | ICD-10-CM | POA: Diagnosis not present

## 2016-10-15 DIAGNOSIS — R471 Dysarthria and anarthria: Secondary | ICD-10-CM | POA: Diagnosis present

## 2016-10-15 DIAGNOSIS — I63511 Cerebral infarction due to unspecified occlusion or stenosis of right middle cerebral artery: Secondary | ICD-10-CM | POA: Diagnosis not present

## 2016-10-15 DIAGNOSIS — Z880 Allergy status to penicillin: Secondary | ICD-10-CM | POA: Diagnosis not present

## 2016-10-15 DIAGNOSIS — L89610 Pressure ulcer of right heel, unstageable: Secondary | ICD-10-CM | POA: Diagnosis present

## 2016-10-15 DIAGNOSIS — I10 Essential (primary) hypertension: Secondary | ICD-10-CM | POA: Diagnosis present

## 2016-10-15 DIAGNOSIS — I6300 Cerebral infarction due to thrombosis of unspecified precerebral artery: Secondary | ICD-10-CM | POA: Diagnosis not present

## 2016-10-15 DIAGNOSIS — F0390 Unspecified dementia without behavioral disturbance: Secondary | ICD-10-CM | POA: Diagnosis present

## 2016-10-15 DIAGNOSIS — R29810 Facial weakness: Secondary | ICD-10-CM | POA: Diagnosis present

## 2016-10-15 DIAGNOSIS — Z515 Encounter for palliative care: Secondary | ICD-10-CM | POA: Diagnosis present

## 2016-10-15 DIAGNOSIS — I639 Cerebral infarction, unspecified: Secondary | ICD-10-CM | POA: Diagnosis present

## 2016-10-15 DIAGNOSIS — E785 Hyperlipidemia, unspecified: Secondary | ICD-10-CM | POA: Diagnosis present

## 2016-10-15 DIAGNOSIS — M1990 Unspecified osteoarthritis, unspecified site: Secondary | ICD-10-CM | POA: Diagnosis present

## 2016-10-15 DIAGNOSIS — R29709 NIHSS score 9: Secondary | ICD-10-CM | POA: Diagnosis present

## 2016-10-15 DIAGNOSIS — I700 Atherosclerosis of aorta: Secondary | ICD-10-CM | POA: Diagnosis present

## 2016-10-15 DIAGNOSIS — Z7982 Long term (current) use of aspirin: Secondary | ICD-10-CM | POA: Diagnosis not present

## 2016-10-15 DIAGNOSIS — M810 Age-related osteoporosis without current pathological fracture: Secondary | ICD-10-CM | POA: Diagnosis present

## 2016-10-15 DIAGNOSIS — Z993 Dependence on wheelchair: Secondary | ICD-10-CM | POA: Diagnosis not present

## 2016-10-15 DIAGNOSIS — L8990 Pressure ulcer of unspecified site, unspecified stage: Secondary | ICD-10-CM

## 2016-10-15 DIAGNOSIS — R531 Weakness: Secondary | ICD-10-CM | POA: Diagnosis present

## 2016-10-15 HISTORY — DX: Cerebral infarction, unspecified: I63.9

## 2016-10-15 LAB — DIFFERENTIAL
Basophils Absolute: 0 10*3/uL (ref 0.0–0.1)
Basophils Relative: 0 %
Eosinophils Absolute: 0.2 10*3/uL (ref 0.0–0.7)
Eosinophils Relative: 2 %
Lymphocytes Relative: 18 %
Lymphs Abs: 1.7 10*3/uL (ref 0.7–4.0)
Monocytes Absolute: 0.6 10*3/uL (ref 0.1–1.0)
Monocytes Relative: 6 %
Neutro Abs: 6.8 10*3/uL (ref 1.7–7.7)
Neutrophils Relative %: 74 %

## 2016-10-15 LAB — I-STAT CHEM 8, ED
BUN: 17 mg/dL (ref 6–20)
Calcium, Ion: 1.27 mmol/L (ref 1.15–1.40)
Chloride: 102 mmol/L (ref 101–111)
Creatinine, Ser: 0.7 mg/dL (ref 0.44–1.00)
Glucose, Bld: 107 mg/dL — ABNORMAL HIGH (ref 65–99)
HCT: 39 % (ref 36.0–46.0)
Hemoglobin: 13.3 g/dL (ref 12.0–15.0)
Potassium: 3.7 mmol/L (ref 3.5–5.1)
Sodium: 143 mmol/L (ref 135–145)
TCO2: 33 mmol/L (ref 0–100)

## 2016-10-15 LAB — PROTIME-INR
INR: 1.04
Prothrombin Time: 13.6 seconds (ref 11.4–15.2)

## 2016-10-15 LAB — COMPREHENSIVE METABOLIC PANEL
ALT: 11 U/L — ABNORMAL LOW (ref 14–54)
AST: 25 U/L (ref 15–41)
Albumin: 3.8 g/dL (ref 3.5–5.0)
Alkaline Phosphatase: 61 U/L (ref 38–126)
Anion gap: 9 (ref 5–15)
BUN: 14 mg/dL (ref 6–20)
CO2: 30 mmol/L (ref 22–32)
Calcium: 10.2 mg/dL (ref 8.9–10.3)
Chloride: 104 mmol/L (ref 101–111)
Creatinine, Ser: 0.72 mg/dL (ref 0.44–1.00)
GFR calc Af Amer: >60 mL/min (ref >60)
GFR calc non Af Amer: >60 mL/min (ref >60)
Glucose, Bld: 113 mg/dL — ABNORMAL HIGH (ref 65–99)
Potassium: 3.7 mmol/L (ref 3.5–5.1)
Sodium: 143 mmol/L (ref 135–145)
Total Bilirubin: 0.7 mg/dL (ref 0.3–1.2)
Total Protein: 6.3 g/dL — ABNORMAL LOW (ref 6.5–8.1)

## 2016-10-15 LAB — CBC
HCT: 41 % (ref 36.0–46.0)
Hemoglobin: 12.5 g/dL (ref 12.0–15.0)
MCH: 25.1 pg — ABNORMAL LOW (ref 26.0–34.0)
MCHC: 30.5 g/dL (ref 30.0–36.0)
MCV: 82.3 fL (ref 78.0–100.0)
Platelets: 206 10*3/uL (ref 150–400)
RBC: 4.98 MIL/uL (ref 3.87–5.11)
RDW: 16.4 % — ABNORMAL HIGH (ref 11.5–15.5)
WBC: 9.3 10*3/uL (ref 4.0–10.5)

## 2016-10-15 LAB — APTT: aPTT: 32 seconds (ref 24–36)

## 2016-10-15 LAB — I-STAT TROPONIN, ED: Troponin i, poc: 0 ng/mL (ref 0.00–0.08)

## 2016-10-15 MED ORDER — SODIUM CHLORIDE 0.9 % IV SOLN
INTRAVENOUS | Status: DC
  Administered 2016-10-15: 19:00:00 via INTRAVENOUS

## 2016-10-15 MED ORDER — VITAMIN C 500 MG PO TABS
500.0000 mg | ORAL_TABLET | Freq: Two times a day (BID) | ORAL | Status: DC
  Administered 2016-10-16 – 2016-10-17 (×2): 500 mg via ORAL
  Filled 2016-10-15 (×4): qty 1

## 2016-10-15 MED ORDER — ENOXAPARIN SODIUM 40 MG/0.4ML ~~LOC~~ SOLN
40.0000 mg | SUBCUTANEOUS | Status: DC
  Administered 2016-10-15 – 2016-10-17 (×3): 40 mg via SUBCUTANEOUS
  Filled 2016-10-15 (×3): qty 0.4

## 2016-10-15 MED ORDER — METOPROLOL SUCCINATE ER 25 MG PO TB24
25.0000 mg | ORAL_TABLET | Freq: Every day | ORAL | Status: DC
  Administered 2016-10-16 – 2016-10-17 (×2): 25 mg via ORAL
  Filled 2016-10-15 (×2): qty 1

## 2016-10-15 MED ORDER — ACETAMINOPHEN 325 MG PO TABS: 650.0000 mg | ORAL_TABLET | ORAL | Status: DC | PRN

## 2016-10-15 MED ORDER — ASPIRIN EC 325 MG PO TBEC
325.0000 mg | DELAYED_RELEASE_TABLET | Freq: Every day | ORAL | Status: DC
  Administered 2016-10-16 – 2016-10-17 (×2): 325 mg via ORAL
  Filled 2016-10-15 (×2): qty 1

## 2016-10-15 MED ORDER — ACETAMINOPHEN 650 MG RE SUPP: 650.0000 mg | RECTAL | Status: DC | PRN

## 2016-10-15 MED ORDER — SENNOSIDES-DOCUSATE SODIUM 8.6-50 MG PO TABS: 1.0000 | ORAL_TABLET | Freq: Every evening | ORAL | Status: DC | PRN

## 2016-10-15 MED ORDER — PRAVASTATIN SODIUM 20 MG PO TABS
20.0000 mg | ORAL_TABLET | Freq: Every day | ORAL | Status: DC
  Administered 2016-10-16: 20 mg via ORAL
  Filled 2016-10-15: qty 1

## 2016-10-15 MED ORDER — IOPAMIDOL (ISOVUE-370) INJECTION 76%
INTRAVENOUS | Status: AC
  Administered 2016-10-15: 50 mL
  Filled 2016-10-15: qty 50

## 2016-10-15 MED ORDER — STROKE: EARLY STAGES OF RECOVERY BOOK
Freq: Once | Status: AC
  Administered 2016-10-15: 19:00:00

## 2016-10-15 MED ORDER — ACETAMINOPHEN 160 MG/5ML PO SOLN: 650.0000 mg | ORAL | Status: DC | PRN

## 2016-10-15 MED ORDER — ADULT MULTIVITAMIN W/MINERALS CH
1.0000 | ORAL_TABLET | Freq: Every day | ORAL | Status: DC
  Administered 2016-10-16 – 2016-10-17 (×2): 1 via ORAL
  Filled 2016-10-15 (×3): qty 1

## 2016-10-15 NOTE — Code Documentation (Signed)
83 y.o. Female w/ PMHx of AAA, HLD, HTN and self reports h/o CVA who was brought in by [place] from [place] on [place]. RN at facility states patient was found this morning at 1045 during rounds to be dizzy and dysarthric. EMS notified and called code stroke. Patient was met at the bridge by the stroke team, labs were drawn and patient taken to CT. Per radiology report, CT with no acute intracranial abnormalities. ASPECTS 10. CTA with no LVO. Patient reports that she woke up this morning and had the above symptoms. Patient appears to be a good historian. Patient further reports that she is unable to walk at her baseline. LKW to be 6/25 at 2200. tPA not given d/t being out of the window. Not a thrombectomy candidate d/t no LVO. On exam, patient with left sided facial droop, LUE drift, LLE with some effort against gravity, ataxic and severe dysarthria. NIHSS 9. See EMR for NIHSS and code stroke times. ED bedside handoff with ED RN [facility].

## 2016-10-15 NOTE — ED Notes (Signed)
ED Provider at bedside. 

## 2016-10-15 NOTE — ED Triage Notes (Signed)
Pt arrives via [facility] from [facility] on [address]. Pt reports she went to be at 2200 last pm and felt fine. Staff at facility reports they first noticed patient had rt sided facial droop and slurred speech at 1055 this am. Pt alert and oriented. Airway intact, nad.

## 2016-10-15 NOTE — H&P (Signed)
Date: 10/15/2016               Patient Name:  Rebecca Knight MRN: [id number]  DOB: [birth year] Age / Sex: 83 y.o., female   PCP: [name], DO         Medical Service: Internal Medicine Teaching Service         Attending Physician: Dr. Lucious Groves, DO    First Contact: Dr. Hetty Ely Pager: [phone]  Second Contact: Dr. Juleen China  Pager: [phone]       After Hours (After 5p/  First Contact Pager: [phone]  weekends / holidays): Second Contact Pager: [phone]   Chief Complaint: Hand weakness, difficulty talking and eating.  History of Present Illness: Past medical history of dementia, urinary incontinence, hypertension, dyslipidemia, prior CVA , history of right hip hemiarthroplasty 04/19/16 and remote history of colon cancer presents with hand weakness and difficulty talking and eating which began this morning at 9 AM. A complete history was difficult to obtain as the patient is demented and disoriented. She is able to deny syncope and says she is not experiencing any pain at this time. She says she was feeling her normal self the day prior to admission.  The ED she was afebrile with heart rate in the 90s to 270W and systolic blood pressure in the 160s. Labs revealed glucose of 113, white count 9.3, hemoglobin 13.3 and troponin 0.0. EKG showed sinus rhythm without signs of acute ischemia. CT of the head revealed no acute intracranial hemorrhage but it did show chronic microvascular disease. CTA showed moderate to the ear right ICA stenosis and mild to moderate right PCA stenosis there was an incidental finding of descending thoracic aorta calcification.   Meds:  Current Meds  Medication Sig  . acetaminophen (TYLENOL) 325 MG tablet Take 2 tablets (650 mg total) by mouth every 6 (six) hours as needed for mild pain (or Fever >/= 101).  Marland Kitchen aspirin EC 81 MG tablet Take 81 mg by mouth daily.  Marland Kitchen gabapentin (NEURONTIN) 100 MG capsule Take 100-300 mg by mouth 3 (three) times daily. 100mg  twice  daily, 300mg  at bedtime  . losartan (COZAAR) 100 MG tablet Take 1 tablet by mouth daily  . magnesium hydroxide (MILK OF MAGNESIA) 400 MG/5ML suspension Take 30 mLs by mouth daily as needed for mild constipation.  . metoprolol succinate (TOPROL-XL) 25 MG 24 hr tablet Take 1 tablet (25 mg total) by mouth daily.  . mirtazapine (REMERON SOL-TAB) 15 MG disintegrating tablet Take 7.5 mg by mouth at bedtime.  . Multiple Vitamins-Minerals (MULTIVITAMINS THER. W/MINERALS) TABS tablet Take 1 tablet by mouth daily.  [name] (SYSTANE) 0.4-0.3 % SOLN Apply 1 drop to eye 4 (four) times daily. Reported on 05/13/2015  . pravastatin (PRAVACHOL) 20 MG tablet 1 BY MOUTH DAILY  . senna (SENOKOT) 8.6 MG tablet Take 1 tablet (8.6 mg total) by mouth 2 (two) times daily as needed for constipation.  . vitamin C (ASCORBIC ACID) 500 MG tablet Take 500 mg by mouth 2 (two) times daily.     Allergies: Allergies as of 10/15/2016 - Review Complete 10/15/2016  Allergen Reaction Noted  . Penicillins Rash 11/20/2006   Past Medical History:  Diagnosis Date  . AAA (abdominal aortic aneurysm) (HCC)    [name]  . Anxiety   . Arthritis   . Calculus of gallbladder without mention of cholecystitis or obstruction   . Cancer [facility])    Colon and Rectal  . Diverticulosis  of colon   . Heart murmur   . Herpes zoster of eye 2002   Dr. Herbert Deaner  . History of colon cancer 1995   Dr Lennie Hummer  . Hyperlipidemia   . Hypertension   . Osteoporosis   . Post herpetic neuralgia   . Varicose veins     Family History:  Family History  Problem Relation Age of Onset  . Hypertension Father   . Heart attack Father 28  . Stroke Mother        > 75  . Diabetes Sister   . Heart attack Sister 72        her twin  . Diabetes Brother   . Heart attack Brother        in 20s  . Brain cancer Daughter   . Anesthesia problems Neg Hx   . Hypotension Neg Hx   . Malignant hyperthermia Neg Hx   . Pseudochol deficiency  Neg Hx    Social History:  She denies history of smoking or alcohol use  Review of Systems: A complete ROS was negative except as per HPI.   Physical Exam: Blood pressure (!) 184/103, pulse 97, temperature 97.8 F (36.6 C), temperature source Oral, resp. rate 18, SpO2 100 %. Physical Exam  Constitutional: She is oriented to person, place, and time and well-developed, well-nourished, and in no distress. No distress.  HENT:  Head: Normocephalic and atraumatic.  Eyes: Conjunctivae and EOM are normal. Pupils are equal, round, and reactive to light. Right eye exhibits no discharge. Left eye exhibits no discharge. No scleral icterus.  Cardiovascular: Normal rate and regular rhythm.   No murmur heard. Pulmonary/Chest: Effort normal and breath sounds normal. No respiratory distress.  Lungs clear lower anterior lung fields  Abdominal: Soft. Bowel sounds are normal. She exhibits no distension. There is no tenderness. There is no guarding.  Neurological: She is alert and oriented to person, place, and time.  She is very soft spoken, has left sided facial droop,  Strength in left hand 3/4, upper extremities 4/5, lower extremity exam was limited by pain   Skin: Skin is warm. She is not diaphoretic.  Psychiatric: Affect and judgment normal.     EKG: Her small review of EKG shows normal sinus rhythm with left axis deviation  Personal review of the CT head shows no acute intracranial abnormalities  Assessment & Plan by Problem:    CVA (cerebral vascular accident) ([place])   HYPERLIPIDEMIA   Essential hypertension  Dyslipidemia 83 year old woman with past medical history retention, dyslipidemia, dementia and prior CVAs who presented with left facial droop and arm weakness. He was found to have right ICA stenosis on CTA. She was evaluated by neurology in the ED. She received aspirin 325.  Follow-up hemoglobin A1c and fasting lipid panel Follow-up MRI, echocardiogram, and carotid Dopplers PT OT  SLP consult Ordered, and pravastatin 20 mg daily   Hypertension She is hypertensive and tachycardic on admission Allowing for permissive hypertension, keep BP <220/110 Restarted home medication metoprolol succinate 25 mg daily for tachycardia   Dispo: Admit patient to Inpatient with expected length of stay greater than 2 midnights.  Signed: Ledell Noss, MD 10/15/2016, 8:31 PM  Pager: [phone]

## 2016-10-15 NOTE — Consult Note (Signed)
Requesting Physician: Dr. Wilson Singer    Chief Complaint: code stroke  History obtained from:  Patient  And Chart  HPI:                                                                                                                                         Rebecca Knight is an 83 y.o. female with a hx of HTN, HLP and old strokes with no residual symptoms on aspirin presented this morning to [facility] after waking up feeling different and having some speech difficulty. At baseline cannot walk due to R hip frx.  Date last known well: 10/14/16 Time last known well: 10/14/16 evening time before bed, patient was unsure what time she went to sleep but between 8-9pm tPA Given: No: wake up stroke  NIHSS 9    Past Medical History:  Diagnosis Date  . AAA (abdominal aortic aneurysm) (HCC)    [name]  . Anxiety   . Arthritis   . Calculus of gallbladder without mention of cholecystitis or obstruction   . Cancer [facility])    Colon and Rectal  . Diverticulosis of colon   . Heart murmur   . Herpes zoster of eye 2002   Dr. Herbert Deaner  . History of colon cancer 1995   Dr Lennie Hummer  . Hyperlipidemia   . Hypertension   . Osteoporosis   . Post herpetic neuralgia   . Varicose veins     Past Surgical History:  Procedure Laterality Date  . COLON SURGERY     RESECTION in 1990s  . ENDOVASCULAR STENT INSERTION  08/08/2011   Procedure: ENDOVASCULAR STENT GRAFT INSERTION;  Surgeon: [name], MD;  Location: [facility] OR;  Service: Vascular;  Laterality: N/A;  Endo Vascular Aortic stent placement with gore  . EYE SURGERY     CAT EXT  OU  . HIP ARTHROPLASTY Right 04/19/2016   Procedure: ARTHROPLASTY BIPOLAR HIP (HEMIARTHROPLASTY);  Surgeon: [name], MD;  Location: [place];  Service: Orthopedics;  Laterality: Right;  . HIP ARTHROPLASTY Right 04/20/2016   Procedure: REVISION ARTHROPLASTY BIPOLAR HIP (HEMIARTHROPLASTY);  Surgeon: [name], MD;  Location: [place];  Service: Orthopedics;  Laterality: Right;  . HIP  CLOSED REDUCTION Right 04/19/2016   Procedure: CLOSED MANIPULATION HIP;  Surgeon: [name], MD;  Location: [place];  Service: Orthopedics;  Laterality: Right;  . TONSILLECTOMY      Family History  Problem Relation Age of Onset  . Hypertension Father   . Heart attack Father 3  . Stroke Mother        > 28  . Diabetes Sister   . Heart attack Sister 37        her twin  . Diabetes Brother   . Heart attack Brother        in 22s  . Brain cancer Daughter   . Anesthesia problems Neg Hx   .  Hypotension Neg Hx   . Malignant hyperthermia Neg Hx   . Pseudochol deficiency Neg Hx    Social History:  reports that she has never smoked. She has never used smokeless tobacco. She reports that she does not drink alcohol or use drugs.  Allergies:  Allergies  Allergen Reactions  . Penicillins Rash    Has patient had a PCN reaction causing immediate rash, facial/tongue/throat swelling, SOB or lightheadedness with hypotension: Yes Has patient had a PCN reaction causing severe rash involving mucus membranes or skin necrosis: Yes Has patient had a PCN reaction that required hospitalization No Has patient had a PCN reaction occurring within the last 10 years: No If all of the above answers are "NO", then may proceed with Cephalosporin use.      Medications:                                                                                                                           I have reviewed the patient's current medications.  ROS:                                                                                                                                       History obtained from chart review  General ROS: negative for - chills, fatigue, fever, night sweats, weight gain or weight loss   Neurologic Examination:                                                                                                      Blood pressure (!) 216/133, pulse 76, temperature 97.4 F (36.3 C),  temperature source Oral, resp. rate 17, SpO2 98 %.  HEENT-  Normocephalic, no lesions, without obvious abnormality.  Normal external eye and conjunctiva.  Normal TM's bilaterally.  Normal auditory canals and external ears. Normal external nose, mucus membranes and septum.  Normal pharynx. Cardiovascular- regular rate and rhythm, S1, S2 normal, no murmur, click, rub or gallop, pulses palpable  throughout   Lungs- chest clear, no wheezing, rales, normal symmetric air entry, Heart exam - S1, S2 normal, no murmur, no gallop, rate regular Abdomen- soft, non-tender; bowel sounds normal; no masses,  no organomegaly Extremities- no edema   Neurological Examination Mental Status: Alert, oriented, thought content appropriate.  Speech with moderate dysarthria without evidence of aphasia.  Able to follow commands without difficulty. Cranial Nerves: II: Discs flat bilaterally; Visual fields grossly normal,  III,IV, VI: ptosis not present, extra-ocular motions intact bilaterally, pupils equal, round, reactive to light and accommodation V,VII:  L face droop VIII: hearing normal bilaterally IX,X: uvula rises symmetrically XI: bilateral shoulder shrug XII: midline tongue extension Motor: Right : Upper extremity   5/5    Left:     Upper extremity   4/5 Lower extremity   3/5- baseline     Lower extremity   2/5 Tone and bulk:normal tone throughout; no atrophy noted Sensory: Pinprick and light touch intact throughout, bilaterally Cerebellar: normal finger-to-nose     Lab Results: Basic Metabolic Panel:  Recent Labs Lab 10/15/16 1159 10/15/16 1206  NA 143 143  K 3.7 3.7  CL 104 102  CO2 30  --   GLUCOSE 113* 107*  BUN 14 17  CREATININE 0.72 0.70  CALCIUM 10.2  --     Liver Function Tests:  Recent Labs Lab 10/15/16 1159  AST 25  ALT 11*  ALKPHOS 61  BILITOT 0.7  PROT 6.3*  ALBUMIN 3.8   No results for input(s): LIPASE, AMYLASE in the last 168 hours. No results for input(s):  AMMONIA in the last 168 hours.  CBC:  Recent Labs Lab 10/15/16 1159 10/15/16 1206  WBC 9.3  --   NEUTROABS 6.8  --   HGB 12.5 13.3  HCT 41.0 39.0  MCV 82.3  --   PLT 206  --     Cardiac Enzymes: No results for input(s): CKTOTAL, CKMB, CKMBINDEX, TROPONINI in the last 168 hours.  Lipid Panel: No results for input(s): CHOL, TRIG, HDL, CHOLHDL, VLDL, LDLCALC in the last 168 hours.  CBG: No results for input(s): GLUCAP in the last 168 hours.  Microbiology: Results for orders placed or performed during the hospital encounter of 05/21/16  Blood culture (routine x 2)     Status: None   Collection Time: 05/21/16  1:17 PM  Result Value Ref Range Status   Specimen Description BLOOD LEFT FOREARM  Final   Special Requests BOTTLES DRAWN AEROBIC AND ANAEROBIC 5CC  Final   Culture NO GROWTH 5 DAYS  Final   Report Status 05/26/2016 FINAL  Final  Blood culture (routine x 2)     Status: None   Collection Time: 05/21/16  2:36 PM  Result Value Ref Range Status   Specimen Description BLOOD RIGHT ANTECUBITAL  Final   Special Requests BOTTLES DRAWN AEROBIC ONLY 10CC  Final   Culture NO GROWTH 5 DAYS  Final   Report Status 05/26/2016 FINAL  Final  Urine culture     Status: Abnormal   Collection Time: 05/21/16  4:25 PM  Result Value Ref Range Status   Specimen Description URINE, CATHETERIZED  Final   Special Requests NONE  Final   Culture MULTIPLE SPECIES PRESENT, SUGGEST RECOLLECTION (A)  Final   Report Status 05/23/2016 FINAL  Final    Coagulation Studies:  Recent Labs  10/15/16 1159  LABPROT 13.6  INR 1.04    Imaging: No results found.     Assessment: 83 y.o. female with a hx  of HTN, HLP and old strokes with no residual symptoms on aspirin presented this morning to [facility] after waking up feeling different and having some speech difficulty. At baseline cannot walk due to R hip frx.  CTH/ CTA head and neck- negative for any acute finding and LVO  1. HgbA1c, fasting lipid  panel 2. MRI  of the brain without contrast 3. PT consult, OT consult, Speech consult 4. Echocardiogram 5. Carotid dopplers 6. Prophylactic therapy-Antiplatelet med: Aspirin - dose 325 7. Risk factor modification 8. Telemetry monitoring 9. Frequent neuro checks 10 NPO until passes stroke swallow screen 11 please page stroke NP  Or  PA  Or MD from 8am -4 pm  as this patient from this time will be  followed by the stroke.   You can look them up on [email or web address]  Password [email or web address]       Stroke Risk Factors - hyperlipidemia and hypertension

## 2016-10-15 NOTE — Progress Notes (Signed)
Received order for Carotid Duplex. CTA neck was performed 10/15/16.  Please advise if carotid is still needed.   Landry Mellow, [place], RVT Vascular lab [phone]

## 2016-10-15 NOTE — ED Provider Notes (Signed)
[place] DEPT Provider Note    By signing my name below, I, Bea Graff, attest that this documentation has been prepared under the direction and in the presence of Virgel Manifold, MD. Electronically Signed: Bea Graff, ED Scribe. 10/15/16. 2:50 PM.    History   Chief Complaint Chief Complaint  Patient presents with  . Code Stroke    The history is provided by the patient and medical records. No language interpreter was used.    HPI Comments:  Rebecca Knight is a 83 y.o. female with PMHx of HLD, HTN, colon and rectal cancer, CVA, brought in by EMS from a nursing facility, who presents to the Emergency Department complaining of stroke like symptoms that began upon waking this morning. She reports associated slurred speech and weakness of BUE to the point she could not eat breakfast. She reports she felt fine when she went to bed last night. Pt states the nursing home staff thought she may have had a stroke so called EMS. She has not received any treatment for her symptoms PTA. There are no modifying factors noted. She denies any pain.     Past Medical History:  Diagnosis Date  . AAA (abdominal aortic aneurysm) (HCC)    [name]  . Anxiety   . Arthritis   . Calculus of gallbladder without mention of cholecystitis or obstruction   . Cancer [facility])    Colon and Rectal  . Diverticulosis of colon   . Heart murmur   . Herpes zoster of eye 2002   Dr. Herbert Deaner  . History of colon cancer 1995   Dr Lennie Hummer  . Hyperlipidemia   . Hypertension   . Osteoporosis   . Post herpetic neuralgia   . Varicose veins     Patient Active Problem List   Diagnosis Date Noted  . Sepsis secondary to UTI ([place])   . Dysphagia   . Metabolic encephalopathy   . Dyslipidemia   . Hypokalemia 05/24/2016  . Hypernatremia   . Pressure injury of skin 05/22/2016  . Sepsis ([place]) 05/21/2016  . UTI (urinary tract infection) 05/21/2016  . Acute kidney injury ([place]) 05/21/2016  .  Dehydration, severe 05/21/2016  . History of right hip hemiarthroplasty 04/19/16 05/21/2016  . Dementia 05/21/2016  . Complicated UTI (urinary tract infection)   . Displaced intertrochanteric fracture of right femur, initial encounter for closed fracture ([place]) 04/30/2016  . Displaced fracture of greater trochanter of right femur, initial encounter for closed fracture ([place])   . Closed displaced fracture of right femoral neck ([place]) 04/18/2016  . Confusion   . Stroke ([place])   . Syncope and collapse   . Cerebral thrombosis with cerebral infarction ([place]) 12/25/2014  . Acute encephalopathy 12/23/2014  . Urinary incontinence 02/10/2012  . Abdominal aneurysm without mention of rupture 04/13/2010  . CHOLELITHIASIS 04/13/2010  . NONSPEC ELEVATION OF LEVELS OF TRANSAMINASE/LDH 04/13/2010  . DIVERTICULOSIS, COLON 06/14/2008  . COLON CANCER, HX OF 06/14/2008  . THYROID NODULE, RIGHT 01/26/2007  . HYPERLIPIDEMIA 01/26/2007  . OSTEOPOROSIS NOS 01/26/2007  . POSTHERPETIC NEURALGIA 11/20/2006  . Essential hypertension 11/20/2006    Past Surgical History:  Procedure Laterality Date  . COLON SURGERY     RESECTION in 1990s  . ENDOVASCULAR STENT INSERTION  08/08/2011   Procedure: ENDOVASCULAR STENT GRAFT INSERTION;  Surgeon: [name], MD;  Location: [facility] OR;  Service: Vascular;  Laterality: N/A;  Endo Vascular Aortic stent placement with gore  . EYE SURGERY     CAT EXT  OU  . HIP ARTHROPLASTY Right 04/19/2016   Procedure: ARTHROPLASTY BIPOLAR HIP (HEMIARTHROPLASTY);  Surgeon: [name], MD;  Location: [place];  Service: Orthopedics;  Laterality: Right;  . HIP ARTHROPLASTY Right 04/20/2016   Procedure: REVISION ARTHROPLASTY BIPOLAR HIP (HEMIARTHROPLASTY);  Surgeon: [name], MD;  Location: [place];  Service: Orthopedics;  Laterality: Right;  . HIP CLOSED REDUCTION Right 04/19/2016   Procedure: CLOSED MANIPULATION HIP;  Surgeon: [name], MD;  Location: [place];  Service: Orthopedics;   Laterality: Right;  . TONSILLECTOMY      OB History    No data available       Home Medications    Prior to Admission medications   Medication Sig Start Date End Date Taking? Authorizing Provider  acetaminophen (TYLENOL) 325 MG tablet Take 2 tablets (650 mg total) by mouth every 6 (six) hours as needed for mild pain (or Fever >/= 101). 04/23/16   Domenic Polite, MD  aspirin EC 81 MG tablet Take 81 mg by mouth daily.    [provider]  gabapentin (NEURONTIN) 100 MG capsule Take 100 mg by mouth 2 (two) times daily.     [provider]  gabapentin (NEURONTIN) 300 MG capsule  03/13/16   [provider]  losartan (COZAAR) 100 MG tablet Take 1 tablet by mouth daily 11/26/13   [provider]  magnesium hydroxide (MILK OF MAGNESIA) 400 MG/5ML suspension Take 30 mLs by mouth daily as needed for mild constipation.    [provider]  metoprolol succinate (TOPROL-XL) 25 MG 24 hr tablet Take 1 tablet (25 mg total) by mouth daily. 05/29/16   Velvet Bathe, MD  mirtazapine (REMERON SOL-TAB) 15 MG disintegrating tablet Take 7.5 mg by mouth at bedtime.    [provider]  Polyethyl Glycol-Propyl Glycol (SYSTANE) 0.4-0.3 % SOLN Apply 1 drop to eye 4 (four) times daily. Reported on 05/13/2015    [provider]  pravastatin (PRAVACHOL) 20 MG tablet 1 BY MOUTH DAILY 10/04/13   [name], MD  senna (SENOKOT) 8.6 MG tablet Take 1 tablet (8.6 mg total) by mouth 2 (two) times daily as needed for constipation. 04/23/16   Domenic Polite, MD    Family History Family History  Problem Relation Age of Onset  . Hypertension Father   . Heart attack Father 40  . Stroke Mother        > 29  . Diabetes Sister   . Heart attack Sister 10        her twin  . Diabetes Brother   . Heart attack Brother        in 20s  . Brain cancer Daughter   . Anesthesia problems Neg Hx   . Hypotension Neg Hx   . Malignant hyperthermia Neg Hx   . Pseudochol  deficiency Neg Hx     Social History Social History  Substance Use Topics  . Smoking status: Never Smoker  . Smokeless tobacco: Never Used  . Alcohol use No     Allergies   Penicillins   Review of Systems Review of Systems All other systems reviewed and are negative for acute change except as noted in the HPI.   Physical Exam Updated Vital Signs BP (!) 170/97   Pulse 76   Temp 97.4 F (36.3 C) (Oral)   Resp 17   SpO2 98%   Physical Exam  Constitutional: She appears well-developed and well-nourished. No distress.  Voice very soft to the point she is  difficult to understand.  HENT:  Head: Normocephalic and atraumatic.  Eyes: EOM are normal.  Neck: Normal range of motion.  Cardiovascular: Normal rate, regular rhythm and normal heart sounds.   Pulmonary/Chest: Effort normal and breath sounds normal.  Abdominal: Soft. She exhibits no distension. There is no tenderness.  Musculoskeletal: Normal range of motion.  Neurological: She is alert.  Cranial nerves II-XII intact. 3/5 strength on left side.  Skin: Skin is warm and dry.  Psychiatric: She has a normal mood and affect. Judgment normal.  Nursing note and vitals reviewed.    ED Treatments / Results  DIAGNOSTIC STUDIES: Oxygen Saturation is 98% on RA, normal by my interpretation.   COORDINATION OF CARE: 1:01 PM- Will order imaging. Pt verbalizes understanding and agrees to plan.  Medications  iopamidol (ISOVUE-370) 76 % injection (50 mLs  Contrast Given 10/15/16 1217)    Labs (all labs ordered are listed, but only abnormal results are displayed) Labs Reviewed  CBC - Abnormal; Notable for the following:       Result Value   MCH 25.1 (*)    RDW 16.4 (*)    All other components within normal limits  COMPREHENSIVE METABOLIC PANEL - Abnormal; Notable for the following:    Glucose, Bld 113 (*)    Total Protein 6.3 (*)    ALT 11 (*)    All other components within normal limits  I-STAT CHEM 8, ED - Abnormal;  Notable for the following:    Glucose, Bld 107 (*)    All other components within normal limits  PROTIME-INR  APTT  DIFFERENTIAL  I-STAT TROPOININ, ED  CBG MONITORING, ED    EKG  EKG Interpretation None       Radiology No results found.  Procedures Procedures (including critical care time)  Medications Ordered in ED Medications  iopamidol (ISOVUE-370) 76 % injection (50 mLs  Contrast Given 10/15/16 1217)     Initial Impression / Assessment and Plan / ED Course  I have reviewed the triage vital signs and the nursing notes.  Pertinent labs & imaging results that were available during my care of the patient were reviewed by me and considered in my medical decision making (see chart for details).     Acute stroke. No bleed. ASA. Admit.   Final Clinical Impressions(s) / ED Diagnoses   Final diagnoses:  Stroke (cerebrum) ([place])    New Prescriptions New Prescriptions   No medications on file    I personally preformed the services scribed in my presence. The recorded information has been reviewed is accurate. Virgel Manifold, MD.     Virgel Manifold, MD 10/26/16 [phone]

## 2016-10-16 ENCOUNTER — Encounter (HOSPITAL_COMMUNITY): Payer: Self-pay | Admitting: General Practice

## 2016-10-16 ENCOUNTER — Inpatient Hospital Stay (HOSPITAL_COMMUNITY): Payer: Medicare Other

## 2016-10-16 DIAGNOSIS — L8990 Pressure ulcer of unspecified site, unspecified stage: Secondary | ICD-10-CM

## 2016-10-16 DIAGNOSIS — I639 Cerebral infarction, unspecified: Principal | ICD-10-CM

## 2016-10-16 DIAGNOSIS — I6300 Cerebral infarction due to thrombosis of unspecified precerebral artery: Secondary | ICD-10-CM

## 2016-10-16 LAB — LIPID PANEL
CHOL/HDL RATIO: 3.1 ratio
CHOLESTEROL: 141 mg/dL (ref 0–200)
HDL: 45 mg/dL (ref >40)
LDL Cholesterol: 73 mg/dL (ref 0–99)
TRIGLYCERIDES: 116 mg/dL (ref <150)
VLDL: 23 mg/dL (ref 0–40)

## 2016-10-16 MED ORDER — RESOURCE THICKENUP CLEAR PO POWD
ORAL | Status: DC | PRN
  Filled 2016-10-16: qty 125

## 2016-10-16 NOTE — Progress Notes (Signed)
MD notified of BP 193/112 taken with dinamap and 178/101 manually.  Will continue to monitor and report changes as needed.

## 2016-10-16 NOTE — Progress Notes (Signed)
STROKE TEAM PROGRESS NOTE   HISTORY OF PRESENT ILLNESS (per record) Rebecca Knight is an 83 y.o. female with a hx of AAA, rectal and colon cancer (in remission), heart murmur, and old strokes with no residual symptoms on aspirin who presented 10/15/2016 to [facility] after waking up feeling different and having some speech difficulty.  At baseline cannot she walk due to R hip frx, and lives with her son.  CTH/ CTA head and neck- negative for any acute finding and LVO.  MR head with acute infarct in right external capsule and radiating white matter tracts. LKW: 2000 on 10/14/2016  Patient was not administered IV t-PA secondary to presenting outside of the treatment window.  She was admitted to General Neurology for further evaluation and treatment.   SUBJECTIVE (INTERVAL HISTORY) She is awake, alert, and follows all commands appropriately.  The patient continues to have LUE weakness, L facial droop, and slurred speech.   OBJECTIVE Temp:  [97.4 F (36.3 C)-98.9 F (37.2 C)] 98.9 F (37.2 C) (06/27 0535) Pulse Rate:  [76-113] 97 (06/27 0535) Cardiac Rhythm: Normal sinus rhythm (06/27 0812) Resp:  [13-23] 20 (06/27 0535) BP: (147-216)/(82-133) 182/104 (06/27 0535) SpO2:  [96 %-100 %] 99 % (06/27 0535)  CBC:  Recent Labs Lab 10/15/16 1159 10/15/16 1206  WBC 9.3  --   NEUTROABS 6.8  --   HGB 12.5 13.3  HCT 41.0 39.0  MCV 82.3  --   PLT 206  --     Basic Metabolic Panel:  Recent Labs Lab 10/15/16 1159 10/15/16 1206  NA 143 143  K 3.7 3.7  CL 104 102  CO2 30  --   GLUCOSE 113* 107*  BUN 14 17  CREATININE 0.72 0.70  CALCIUM 10.2  --     Lipid Panel:    Component Value Date/Time   CHOL 141 10/16/2016 0618   TRIG 116 10/16/2016 0618   HDL 45 10/16/2016 0618   CHOLHDL 3.1 10/16/2016 0618   VLDL 23 10/16/2016 0618   LDLCALC 73 10/16/2016 0618   HgbA1c:  Lab Results  Component Value Date   HGBA1C 5.5 12/24/2014   Urine Drug Screen:    Component Value Date/Time   LABOPIA  NONE DETECTED 12/23/2014 1251   COCAINSCRNUR NONE DETECTED 12/23/2014 1251   LABBENZ NONE DETECTED 12/23/2014 1251   AMPHETMU NONE DETECTED 12/23/2014 1251   THCU NONE DETECTED 12/23/2014 1251   LABBARB NONE DETECTED 12/23/2014 1251    Alcohol Level     Component Value Date/Time   ETH <5 12/23/2014 1200    IMAGING Ct Angio Head W and Wo Contrast 10/15/2016 IMPRESSION: 1. Negative for emergent large vessel occlusion. 2. Positive for moderate to severe distal right ICA stenosis, due to calcified plaque at the distal supraclinoid segment. The right ICA terminus and right anterior circulation remain patent. 3. Mild to moderate right PCA atherosclerotic stenosis. 4. Extracranial arteries are tortuous, but with minimal atherosclerosis and there is no significant stenosis in the neck. 5. Ectatic aortic arch and proximal descending thoracic aorta with mild to moderate calcified atherosclerosis.  Mr Brain Wo Contrast 10/16/2016 IMPRESSION: Acute infarction in the right external capsule/ radiating white matter tracts. Extensive chronic small vessel ischemic changes elsewhere throughout the brain as outlined above.   Ct Head Code Stroke W/o Cm 10/15/2016 IMPRESSION: 1. No acute cortically based infarct or acute intracranial hemorrhage. Chronic small vessel disease appears stable since January. 2. ASPECTS is 10.     PHYSICAL EXAM elderly pleasant frail  Caucasianl ady not in distress.  . Afebrile. Head is nontraumatic. Neck is supple without bruit.    Cardiac exam no murmur or gallop. Lungs are clear to auscultation. Distal pulses are well felt. Neurological Exam :  Awake alert oriented x 3 normal speech and language. Mild left lower face asymmetry. Tongue midline. No drift. Mild diminished fine finger movements on left. Orbits right over left upper extremity. Mild left grip weak..left lower extremity 3/5 strength. Right lower extremity mild hip flexion weakness due to pain Normal sensation . Normal  coordination.Gait deferred  ASSESSMENT/PLAN Rebecca Knight is a 83 y.o. female with history of AAA, rectal and colon cancer (in remission), heart murmur, and old strokes with no residual symptoms on aspirin  presenting with left arm weakness, left facial droop, and slurred speech. She did not receive IV t-PA due to arriving outside of the treatment window.   Stroke:  acute infarct in right external capsule and radiating white matter tracts in the setting of extensive chronic small vessel ischemic changes.  Resultant  Left hemi-paresis  CT head: no stroke  MRI head:  acute infarct in right external capsule and radiating white matter tracts  Carotid Doppler  Not ordered   2D Echo   ordered    LDL 73  HgbA1c 5.5  Lovenox 40 mg sq daily for VTE prophylaxis  Diet NPO time specified  aspirin 81 mg daily prior to admission, now on aspirin 325 mg daily  Patient counseled to be compliant with her antithrombotic medications  Ongoing aggressive stroke risk factor management  Therapy recommendations: SNF   Disposition:  Pending   Hypertension  Stable:   Permissive hypertension (OK if < 220/120) but gradually normalize in 5-7 days  Long-term BP goal normotensive  Other Stroke Risk Factors  Advanced age  Hx stroke/TIA  Other Active Problems  None  Hospital day # 1  I have personally examined this patient, reviewed notes, independently viewed imaging studies, participated in medical decision making and plan of care.ROS completed by me personally and pertinent positives fully documented  I have made any additions or clarifications directly to the above note.  She presented with left face and body weakness secondary to right brain subcortical infarct likely from small vessel disease. Recommend increase dose of aspirin to 325 mg and continue ongoing stroke workup. Continue therapy and rehabilitation. No family available at bedside. Greater than 50% time during this  25 minute  visit was spent on counseling and coordination of care about stroke and discussion about treatment and prevention options.   Antony Contras, MD Medical Director [place] Pager: [phone] 10/16/2016 7:54 PM   To contact Stroke Continuity provider, please refer to [email or web address] After hours, contact General Neurology

## 2016-10-16 NOTE — Progress Notes (Addendum)
   Subjective: Says her voice feels stronger today and she feels like she has more strength in her hands. She denies any new weakness or other symptoms.   ADDENDUM  I met with Ms. Detjen son Truman Hayward this afternoon after rounds. We discussed the option to proceed with further workup and what that would entail and he opted to continue medical management with no further workup so I have canceled the echocardiogram. He expressed that he would like for his mother to be comfortable and would not want to see her put through unnecessary procedures or CPR. We completed a new MOST form and I have placed that in the chart.   Objective:  Vital signs in last 24 hours: Vitals:   10/16/16 0044 10/16/16 0200 10/16/16 0535 10/16/16 1030  BP: (!) 193/112 (!) 178/101 (!) 182/104 (!) 180/105  Pulse: 91  97 96  Resp: '16  20 20  '$ Temp: 98.1 F (36.7 C)  98.9 F (37.2 C) 98 F (36.7 C)  TempSrc: Oral  Oral Oral  SpO2: 98%  99% 99%   Physical Exam  Constitutional: She is oriented to person, place, and time and well-developed, well-nourished, and in no distress. No distress.  HENT:  Head: Normocephalic and atraumatic.  Eyes: Conjunctivae are normal. Right eye exhibits no discharge. Left eye exhibits no discharge. No scleral icterus.  Cardiovascular: Normal rate and regular rhythm.   No murmur heard. Pulmonary/Chest: Effort normal and breath sounds normal. No respiratory distress. She has no wheezes. She has no rales.  Abdominal: Soft. She exhibits no distension. There is no tenderness. There is no guarding.  Neurological: She is alert and oriented to person, place, and time.  Left sided facial droop  Skin: Skin is warm and dry. She is not diaphoretic.  Psychiatric: Affect and judgment normal.   Assessment/Plan:    CVA (cerebral vascular accident) ([place])   HYPERLIPIDEMIA   Dementia CTA showed severe right internal carotid artery stenosis and MRI head showed acute right external capsule stroke with chronic  small vessel ischemic changes. Strength of her voice and hand strength seems to have improvement. Facial droop is still observed. SLP evaluated her today and feels that she would benefit from SNF.  - Awaiting PT and OT evaluation - follow up echo  - Continue risk factor modification follow-up A1c and fasting lipid panel -dysphagia I diet and aspiration precautions - aspirin 325 mg daily  -pravastatin 20 mg daily     Essential hypertension Allowed for permissive hypertension for the first 24 hours, she remains hypertensive at this time so I will restart her home medication losartan 100 mg daily.  Dispo: Anticipated discharge in approximately 1-2 day(s).   Ledell Noss, MD 10/16/2016, 1:19 PM Pager: [phone]

## 2016-10-16 NOTE — Evaluation (Signed)
Physical Therapy Evaluation Patient Details Name: Rebecca Knight MRN: [id number] DOB: 11/24/1922 Today's Date: 10/16/2016   History of Present Illness  Pt is a 83 y/o female admitted secondary to hand weakness and difficulty talking and eating. MRI revealed an acute infarct of the R external capsule/radiating white matter infarcts. PMH including but not limited to dementia, HTN, prior CVA and R hip hemiarthroplasty in 2017.  Clinical Impression  Pt presented supine in bed with HOB elevated, awake and willing to participate in therapy session. Prior to admission, pt/pt's family reported that she was dependent with ADLs, required assistance with transfers and used a w/c for mobility. Pt is from [facility]F and is planning to return there. Pt currently requires mod A for bed mobility and mod A x2 for transfers. Pt would continue to benefit from skilled physical therapy services at this time while admitted and after d/c to address the below listed limitations in order to improve overall safety and independence with functional mobility.     Follow Up Recommendations SNF    Equipment Recommendations  None recommended by PT    Recommendations for Other Services       Precautions / Restrictions Precautions Precautions: Fall Restrictions Weight Bearing Restrictions: No      Mobility  Bed Mobility Overal bed mobility: Needs Assistance Bed Mobility: Rolling;Supine to Sit Rolling: Min guard   Supine to sit: Mod assist     General bed mobility comments: increased time, use of bed rails, mod A to elevate trunk to achieve sitting EOB  Transfers Overall transfer level: Needs assistance Equipment used: 2 person hand held assist Transfers: Sit to/from [facility] Sit to Stand: Mod assist;+2 physical assistance Stand pivot transfers: Mod assist;+2 physical assistance       General transfer comment: increased time, cueing for safety and sequencing, mod A x2 to rise from  EOB and for pivotal movements to recliner chair  Ambulation/Gait                Stairs            Wheelchair Mobility    Modified Rankin (Stroke Patients Only) Modified Rankin (Stroke Patients Only) Pre-Morbid Rankin Score: Severe disability Modified Rankin: Severe disability     Balance Overall balance assessment: Needs assistance Sitting-balance support: Feet supported;Bilateral upper extremity supported Sitting balance-Leahy Scale: Poor Sitting balance - Comments: close min guard with bilateral UE supports   Standing balance support: During functional activity;Bilateral upper extremity supported Standing balance-Leahy Scale: Poor Standing balance comment: mod A x2                             Pertinent Vitals/Pain Pain Assessment: Faces Faces Pain Scale: No hurt    Home Living Family/patient expects to be discharged to:: Skilled nursing facility                 Additional Comments: Per daughter-in-law, pt is from [facility]    Prior Function Level of Independence: Needs assistance   Gait / Transfers Assistance Needed: pt's daughter-in-law stated that she uses a w/c for mobility and requires assistance with transfers  ADL's / Homemaking Assistance Needed: dependent        Hand Dominance        Extremity/Trunk Assessment   Upper Extremity Assessment Upper Extremity Assessment: Defer to OT evaluation    Lower Extremity Assessment Lower Extremity Assessment: Generalized weakness    Cervical / Trunk Assessment Cervical /  Trunk Assessment: Kyphotic  Communication   Communication: No difficulties  Cognition Arousal/Alertness: Awake/alert Behavior During Therapy: WFL for tasks assessed/performed Overall Cognitive Status: History of cognitive impairments - at baseline                                 General Comments: pt with dementia at baseline, but following one step commands appropriately throughout       General Comments      Exercises     Assessment/Plan    PT Assessment Patient needs continued PT services  PT Problem List Decreased strength;Decreased activity tolerance;Decreased balance;Decreased mobility;Decreased coordination;Decreased cognition;Decreased knowledge of use of DME;Decreased safety awareness       PT Treatment Interventions DME instruction;Gait training;Stair training;Functional mobility training;Therapeutic activities;Therapeutic exercise;Balance training;Neuromuscular re-education;Cognitive remediation;Patient/family education    PT Goals (Current goals can be found in the Care Plan section)  Acute Rehab PT Goals Patient Stated Goal: none stated PT Goal Formulation: With patient/family Time For Goal Achievement: 10/30/16 Potential to Achieve Goals: Fair    Frequency Min 2X/week   Barriers to discharge        Co-evaluation PT/OT/SLP Co-Evaluation/Treatment: Yes Reason for Co-Treatment: For patient/therapist safety;To address functional/ADL transfers PT goals addressed during session: Mobility/safety with mobility;Balance;Strengthening/ROM         AM-PAC PT "6 Clicks" Daily Activity  Outcome Measure Difficulty turning over in bed (including adjusting bedclothes, sheets and blankets)?: A Lot Difficulty moving from lying on back to sitting on the side of the bed? : Total Difficulty sitting down on and standing up from a chair with arms (e.g., wheelchair, bedside commode, etc,.)?: Total Help needed moving to and from a bed to chair (including a wheelchair)?: A Lot Help needed walking in hospital room?: Total Help needed climbing 3-5 steps with a railing? : Total 6 Click Score: 8    End of Session Equipment Utilized During Treatment: Gait belt Activity Tolerance: Patient tolerated treatment well Patient left: in chair;with call bell/phone within reach;with chair alarm set;with family/visitor present Nurse Communication: Mobility status PT Visit  Diagnosis: Other abnormalities of gait and mobility (R26.89);Other symptoms and signs involving the nervous system (R29.898)    Time: 6384-6659 PT Time Calculation (min) (ACUTE ONLY): 27 min   Charges:   PT Evaluation $PT Eval Moderate Complexity: 1 Procedure     PT G Codes:        Sherie Don, PT, DPT [place] 10/16/2016, 3:58 PM

## 2016-10-16 NOTE — Consult Note (Signed)
[facility] Nurse wound consult note Reason for Consult:sacrum/heel Patient from SNF Wound type: Unstageable Pressure Injury right heel: 0.5cm x 0.5cm x 0cm Stage 2 Pressure Injury sacrum x 2 areas: o.5cm x 0.2cm x 0.1cm and 0.3cm x 0.2cm x 0.1cm  Pressure Injury POA: Yes Measurement: see above  Wound bed: Right heel: 100% eschar Sacrum, both areas partial thickness superficial pink, moist  Drainage (amount, consistency, odor) none from either site Periwound: intact, some blanchable redness on the sacrum Dressing procedure/placement/frequency: Silicone foam to the bilateral heels and the sacrum to protect from further injury.  Add Prevalon boot to the right for offloading the heel ulcer Using PureWick to manage urinary incontinence.  Discussed POC with patient and bedside nurse.  Re consult if needed, will not follow at this time. Thanks  Belen Zwahlen [employer], RN,CWOCN, CNS, [place] [phone])

## 2016-10-16 NOTE — Evaluation (Signed)
Occupational Therapy Evaluation Patient Details Name: Rebecca Knight MRN: [id number] DOB: 01-18-1923 Today's Date: 10/16/2016    History of Present Illness Pt is a 83 y/o female admitted secondary to hand weakness and difficulty talking and eating. MRI revealed an acute infarct of the R external capsule/radiating white matter infarcts. PMH including but not limited to dementia, HTN, prior CVA and R hip hemiarthroplasty in 2017.   Clinical Impression   PTA Pt required assist with all ADL (with the exception of feeding - Pt could feed herself). Pt was using a WC and getting assist for transfers. Pt is from [facility] SNF and is planning on returning there.  Pt currently requires mod A for bed mobility and mod A x2 for transfers. OT currently recommending OT follow up at SNF level for therapist to be able to evaluate and maximize independence and safety in her "home" environment. OT to sign off in the acute setting. Thank you for this referral.    Follow Up Recommendations  SNF    Equipment Recommendations  None recommended by OT    Recommendations for Other Services       Precautions / Restrictions Precautions Precautions: Fall Restrictions Weight Bearing Restrictions: No      Mobility Bed Mobility Overal bed mobility: Needs Assistance Bed Mobility: Rolling;Supine to Sit Rolling: Min guard   Supine to sit: Mod assist     General bed mobility comments: increased time, use of bed rails, mod A to elevate trunk to achieve sitting EOB  Transfers Overall transfer level: Needs assistance Equipment used: 2 person hand held assist Transfers: Sit to/from [facility] Sit to Stand: Mod assist;+2 physical assistance Stand pivot transfers: Mod assist;+2 physical assistance       General transfer comment: increased time, cueing for safety and sequencing, mod A x2 to rise from EOB and for pivotal movements to recliner chair    Balance Overall balance assessment: Needs  assistance Sitting-balance support: Feet supported;Bilateral upper extremity supported Sitting balance-Leahy Scale: Poor Sitting balance - Comments: close min guard with bilateral UE supports   Standing balance support: During functional activity;Bilateral upper extremity supported Standing balance-Leahy Scale: Poor Standing balance comment: mod A x2                           ADL either performed or assessed with clinical judgement   ADL Overall ADL's : At baseline                                       General ADL Comments: Pt is dependent at baseline - requiring assist for all ADL with the exception of self-feeding.      Vision Patient Visual Report: No change from baseline       Perception     Praxis      Pertinent Vitals/Pain Pain Assessment: Faces Faces Pain Scale: No hurt     Hand Dominance Right   Extremity/Trunk Assessment Upper Extremity Assessment Upper Extremity Assessment: Generalized weakness   Lower Extremity Assessment Lower Extremity Assessment: Defer to PT evaluation   Cervical / Trunk Assessment Cervical / Trunk Assessment: Kyphotic   Communication Communication Communication: No difficulties   Cognition Arousal/Alertness: Awake/alert Behavior During Therapy: WFL for tasks assessed/performed Overall Cognitive Status: History of cognitive impairments - at baseline  General Comments: pt with dementia at baseline, but following one step commands appropriately throughout   General Comments  Pt's daughter in law present and able to provide prior performance information    Exercises     Shoulder Instructions      Home Living Family/patient expects to be discharged to:: Skilled nursing facility                                 Additional Comments: Per daughter-in-law, pt is from [facility]      Prior Functioning/Environment Level of Independence: Needs  assistance  Gait / Transfers Assistance Needed: pt's daughter-in-law stated that she uses a w/c for mobility and requires assistance with transfers ADL's / Homemaking Assistance Needed: Per daughter in law, Pt is dependent in all ADL with the exception of feeding, Pt is able to feed self            OT Problem List: Decreased activity tolerance;Impaired balance (sitting and/or standing);Decreased safety awareness;Decreased knowledge of use of DME or AE      OT Treatment/Interventions:      OT Goals(Current goals can be found in the care plan section) Acute Rehab OT Goals Patient Stated Goal: none stated  OT Frequency:     Barriers to D/C:            Co-evaluation PT/OT/SLP Co-Evaluation/Treatment: Yes Reason for Co-Treatment: For patient/therapist safety;To address functional/ADL transfers PT goals addressed during session: Mobility/safety with mobility;Balance;Strengthening/ROM OT goals addressed during session: ADL's and self-care      AM-PAC PT "6 Clicks" Daily Activity     Outcome Measure Help from another person eating meals?: None Help from another person taking care of personal grooming?: A Little Help from another person toileting, which includes using toliet, bedpan, or urinal?: A Lot Help from another person bathing (including washing, rinsing, drying)?: A Lot Help from another person to put on and taking off regular upper body clothing?: A Little Help from another person to put on and taking off regular lower body clothing?: A Lot 6 Click Score: 16   End of Session Equipment Utilized During Treatment: Gait belt Nurse Communication: Mobility status  Activity Tolerance: Patient tolerated treatment well Patient left: in chair;with call bell/phone within reach;with chair alarm set;with family/visitor present  OT Visit Diagnosis: Unsteadiness on feet (R26.81);Muscle weakness (generalized) (M62.81)                Time: 3614-4315 OT Time Calculation (min): 24  min Charges:  OT General Charges $OT Visit: 1 Procedure OT Evaluation $OT Eval Moderate Complexity: 1 Procedure G-Codes:     Hulda Humphrey OTR/L [place] 10/16/2016, 4:27 PM

## 2016-10-16 NOTE — Evaluation (Signed)
Clinical/Bedside Swallow Evaluation Patient Details  Name: Rebecca Knight MRN: [id number] Date of Birth: January 12, 1923  Today's Date: 10/16/2016 Time: SLP Start Time (ACUTE ONLY): 0272 SLP Stop Time (ACUTE ONLY): 1003 SLP Time Calculation (min) (ACUTE ONLY): 10 min  Past Medical History:  Past Medical History:  Diagnosis Date  . AAA (abdominal aortic aneurysm) (HCC)    [name]  . Anxiety   . Arthritis   . Calculus of gallbladder without mention of cholecystitis or obstruction   . Cancer [facility])    Colon and Rectal  . Diverticulosis of colon   . Heart murmur   . Herpes zoster of eye 2002   Dr. Herbert Deaner  . History of colon cancer 1995   Dr Lennie Hummer  . Hyperlipidemia   . Hypertension   . Osteoporosis   . Post herpetic neuralgia   . Varicose veins    Past Surgical History:  Past Surgical History:  Procedure Laterality Date  . COLON SURGERY     RESECTION in 1990s  . ENDOVASCULAR STENT INSERTION  08/08/2011   Procedure: ENDOVASCULAR STENT GRAFT INSERTION;  Surgeon: [name], MD;  [facility];  Service: Vascular;  Laterality: N/A;  Endo Vascular Aortic stent placement with gore  . EYE SURGERY     CAT EXT  OU  . HIP ARTHROPLASTY Right 04/19/2016   Procedure: ARTHROPLASTY BIPOLAR HIP (HEMIARTHROPLASTY);  Surgeon: [name], MD;  Location: [place];  Service: Orthopedics;  Laterality: Right;  . HIP ARTHROPLASTY Right 04/20/2016   Procedure: REVISION ARTHROPLASTY BIPOLAR HIP (HEMIARTHROPLASTY);  Surgeon: [name], MD;  Location: [place];  Service: Orthopedics;  Laterality: Right;  . HIP CLOSED REDUCTION Right 04/19/2016   Procedure: CLOSED MANIPULATION HIP;  Surgeon: [name], MD;  Location: [place];  Service: Orthopedics;  Laterality: Right;  . TONSILLECTOMY     HPI:  Pt is a 83 yo female admitted for slurred speech and R facial droop. CT Head negative; MRI pending. She had a prior MBS in February 2018 recommending Dys 1 diet and thin liquids by cup only due to  prolonged oral phase and penetration of larger sips of thin liquids. Previous cognitive evaluation in 2016 showed impaired attention and memory (she was living at ALF at the time). PMH includes dementia, urinary incontinence, HTN, dyslipidemia, CVA, colon cancer, anxiety   Assessment / Plan / Recommendation Clinical Impression  Pt has a prolonged oral phase of swallow, which appears to be consistent with previously documented oral deficits from [facility] earlier this year. Her oral clearance is sufficient, although it is mild-moderately effortful even with liquids and purees. Pt has immediate coughing that consistently folliows even small sips of thin liquids, concerning for aspiration and not mitigated by hand-over-hand assit for controlled rate of intake. No overt signs of aspiration were observed with purees or thickened liquids. Recommend initiation of Dys 1 diet and nectar thick liquids. SLP will f/u on next date to assess for signs of improvement versus need for instrumental testing as medical w/u continues. SLP Visit Diagnosis: Dysphagia, oropharyngeal phase (R13.12)    Aspiration Risk  Mild aspiration risk;Moderate aspiration risk    Diet Recommendation Dysphagia 1 (Puree);Nectar-thick liquid   Liquid Administration via: Cup;Straw Medication Administration: Crushed with puree Supervision: Full supervision/cueing for compensatory strategies;Staff to assist with self feeding Compensations: Minimize environmental distractions;Slow rate;Small sips/bites;Follow solids with liquid Postural Changes: Seated upright at 90 degrees;Remain upright for at least 30 minutes after po intake    Other  Recommendations  Oral Care Recommendations: Oral care BID Other Recommendations: Order thickener from pharmacy;Prohibited food (jello, ice cream, thin soups);Remove water pitcher   Follow up Recommendations Skilled Nursing facility      Frequency and Duration min 2x/week  2 weeks       Prognosis Prognosis  for Safe Diet Advancement: Good Barriers to Reach Goals: Cognitive deficits      Swallow Study   General HPI: Pt is a 83 yo female admitted for slurred speech and R facial droop. CT Head negative; MRI pending. She had a prior MBS in February 2018 recommending Dys 1 diet and thin liquids by cup only due to prolonged oral phase and penetration of larger sips of thin liquids. Previous cognitive evaluation in 2016 showed impaired attention and memory (she was living at ALF at the time). PMH includes dementia, urinary incontinence, HTN, dyslipidemia, CVA, colon cancer, anxiety Type of Study: Bedside Swallow Evaluation Previous Swallow Assessment: see HPI Diet Prior to this Study: NPO Temperature Spikes Noted: No Respiratory Status: Room air History of Recent Intubation: No Behavior/Cognition: Alert;Cooperative;Pleasant mood;Confused;Requires cueing Oral Cavity Assessment: Dry (mostly dried lips) Oral Care Completed by SLP: No Oral Cavity - Dentition: Adequate natural dentition Vision: Functional for self-feeding Self-Feeding Abilities: Needs assist Patient Positioning: Upright in bed Baseline Vocal Quality: Other (comment) (mildly weak intensity)    Oral/Motor/Sensory Function     Ice Chips Ice chips: Within functional limits Presentation: Spoon   Thin Liquid Thin Liquid: Impaired Presentation: Cup;Self Fed Oral Phase Functional Implications: Oral holding;Prolonged oral transit Pharyngeal  Phase Impairments: Cough - Immediate    Nectar Thick Nectar Thick Liquid: Impaired Presentation: Cup;Self Fed;Straw Oral phase functional implications: Oral holding;Prolonged oral transit   Honey Thick Honey Thick Liquid: Not tested   Puree Puree: Impaired Presentation: Spoon Oral Phase Functional Implications: Oral holding;Prolonged oral transit   Solid   GO   Solid: Not tested        Germain Osgood 10/16/2016,11:20 AM  Germain Osgood, M.A. CCC-SLP [phone]

## 2016-10-16 NOTE — Plan of Care (Signed)
Problem: Safety: Goal: Ability to remain free from injury will improve Outcome: Not Progressing Patient attempted to get out of bed without using call bell to call for assistance. Patient is a high fall risk. Patient not progressing towards goal..

## 2016-10-16 NOTE — Evaluation (Signed)
Speech Language Pathology Evaluation Patient Details Name: Rebecca Knight MRN: [id number] DOB: 06/14/1922 Today's Date: 10/16/2016 Time: 5427-0623 SLP Time Calculation (min) (ACUTE ONLY): 20 min  Problem List:  Patient Active Problem List   Diagnosis Date Noted  . CVA (cerebral vascular accident) ([place]) 10/15/2016  . Sepsis secondary to UTI ([place])   . Dysphagia   . Metabolic encephalopathy   . Dyslipidemia   . Hypokalemia 05/24/2016  . Hypernatremia   . Pressure injury of skin 05/22/2016  . Sepsis ([place]) 05/21/2016  . UTI (urinary tract infection) 05/21/2016  . Acute kidney injury ([place]) 05/21/2016  . Dehydration, severe 05/21/2016  . History of right hip hemiarthroplasty 04/19/16 05/21/2016  . Dementia 05/21/2016  . Complicated UTI (urinary tract infection)   . Displaced intertrochanteric fracture of right femur, initial encounter for closed fracture ([place]) 04/30/2016  . Displaced fracture of greater trochanter of right femur, initial encounter for closed fracture ([place])   . Closed displaced fracture of right femoral neck ([place]) 04/18/2016  . Confusion   . Stroke ([place])   . Syncope and collapse   . Cerebral thrombosis with cerebral infarction ([place]) 12/25/2014  . Acute encephalopathy 12/23/2014  . Urinary incontinence 02/10/2012  . Aneurysm of abdominal vessel ([place]) 04/13/2010  . CHOLELITHIASIS 04/13/2010  . NONSPEC ELEVATION OF LEVELS OF TRANSAMINASE/LDH 04/13/2010  . DIVERTICULOSIS, COLON 06/14/2008  . COLON CANCER, HX OF 06/14/2008  . THYROID NODULE, RIGHT 01/26/2007  . HYPERLIPIDEMIA 01/26/2007  . OSTEOPOROSIS NOS 01/26/2007  . POSTHERPETIC NEURALGIA 11/20/2006  . Essential hypertension 11/20/2006   Past Medical History:  Past Medical History:  Diagnosis Date  . AAA (abdominal aortic aneurysm) (HCC)    [name]  . Anxiety   . Arthritis   . Calculus of gallbladder without mention of cholecystitis or obstruction   . Cancer [facility])    Colon and Rectal  . Diverticulosis  of colon   . Heart murmur   . Herpes zoster of eye 2002   Dr. Herbert Deaner  . History of colon cancer 1995   Dr Lennie Hummer  . Hyperlipidemia   . Hypertension   . Osteoporosis   . Post herpetic neuralgia   . Varicose veins    Past Surgical History:  Past Surgical History:  Procedure Laterality Date  . COLON SURGERY     RESECTION in 1990s  . ENDOVASCULAR STENT INSERTION  08/08/2011   Procedure: ENDOVASCULAR STENT GRAFT INSERTION;  Surgeon: [name], MD;  Location: [facility] OR;  Service: Vascular;  Laterality: N/A;  Endo Vascular Aortic stent placement with gore  . EYE SURGERY     CAT EXT  OU  . HIP ARTHROPLASTY Right 04/19/2016   Procedure: ARTHROPLASTY BIPOLAR HIP (HEMIARTHROPLASTY);  Surgeon: [name], MD;  Location: [place];  Service: Orthopedics;  Laterality: Right;  . HIP ARTHROPLASTY Right 04/20/2016   Procedure: REVISION ARTHROPLASTY BIPOLAR HIP (HEMIARTHROPLASTY);  Surgeon: [name], MD;  Location: [place];  Service: Orthopedics;  Laterality: Right;  . HIP CLOSED REDUCTION Right 04/19/2016   Procedure: CLOSED MANIPULATION HIP;  Surgeon: [name], MD;  Location: [place];  Service: Orthopedics;  Laterality: Right;  . TONSILLECTOMY     HPI:  Pt is a 83 yo female admitted for slurred speech and R facial droop. CT Head negative; MRI pending. She had a prior MBS in February 2018 recommending Dys 1 diet and thin liquids by cup only due to prolonged oral phase and penetration of larger sips of thin liquids. Previous cognitive evaluation  in 2016 showed impaired attention and memory (she was living at ALF at the time). PMH includes dementia, urinary incontinence, HTN, dyslipidemia, CVA, colon cancer, anxiety   Assessment / Plan / Recommendation Clinical Impression  Pt was oriented x3, but with Min cues she could be reoriented to location and was able to demonstrate orientation again upon completion of the eval. She does however have significant impairments with sustained attention and  storage/retrieval of new information. There is no caregiver present to discuss baseline level of function, although these areas of impairment were also identified in prior SLP evaluation in 2016. She shows limited intellectual and emergent awareness of deficits which does impact her safety, therefore will continue to address cognitive deficits as SLP returns for dysphagia treatment as well, pending medical w/u and determination of pt's baseline.    SLP Assessment  SLP Recommendation/Assessment: Patient needs continued Speech Lanaguage Pathology Services SLP Visit Diagnosis: Cognitive communication deficit (R41.841)    Follow Up Recommendations  Skilled Nursing facility    Frequency and Duration min 2x/week  2 weeks      SLP Evaluation Cognition  Overall Cognitive Status: No family/caregiver present to determine baseline cognitive functioning Arousal/Alertness: Awake/alert Orientation Level: Oriented to person;Oriented to time;Oriented to situation;Disoriented to place Attention: Sustained Sustained Attention: Impaired Sustained Attention Impairment: Verbal basic Memory: Impaired Memory Impairment: Retrieval deficit;Decreased recall of new information Awareness: Impaired Awareness Impairment: Intellectual impairment;Emergent impairment;Anticipatory impairment       Comprehension  Auditory Comprehension Overall Auditory Comprehension: Appears within functional limits for tasks assessed    Expression Expression Primary Mode of Expression: Verbal Verbal Expression Overall Verbal Expression: Appears within functional limits for tasks assessed   Oral / Motor  Motor Speech Overall Motor Speech: Appears within functional limits for tasks assessed   GO                    Germain Osgood 10/16/2016, 12:42 PM  Germain Osgood, M.A. CCC-SLP [phone]

## 2016-10-17 ENCOUNTER — Inpatient Hospital Stay (HOSPITAL_COMMUNITY): Payer: Medicare Other

## 2016-10-17 LAB — BASIC METABOLIC PANEL
Anion gap: 10 (ref 5–15)
BUN: 15 mg/dL (ref 6–20)
CHLORIDE: 103 mmol/L (ref 101–111)
CO2: 28 mmol/L (ref 22–32)
CREATININE: 0.7 mg/dL (ref 0.44–1.00)
Calcium: 10.3 mg/dL (ref 8.9–10.3)
GFR calc Af Amer: >60 mL/min (ref >60)
GFR calc non Af Amer: >60 mL/min (ref >60)
Glucose, Bld: 112 mg/dL — ABNORMAL HIGH (ref 65–99)
POTASSIUM: 3.1 mmol/L — AB (ref 3.5–5.1)
Sodium: 141 mmol/L (ref 135–145)

## 2016-10-17 LAB — HEMOGLOBIN A1C
HEMOGLOBIN A1C: 5.3 % (ref 4.8–5.6)
MEAN PLASMA GLUCOSE: 105 mg/dL

## 2016-10-17 MED ORDER — HYDRALAZINE HCL 20 MG/ML IJ SOLN: 5.0000 mg | Freq: Three times a day (TID) | INTRAMUSCULAR | Status: DC | PRN

## 2016-10-17 MED ORDER — CLOPIDOGREL BISULFATE 75 MG PO TABS: 75.0000 mg | ORAL_TABLET | Freq: Every day | ORAL | 0 refills | Status: AC

## 2016-10-17 MED ORDER — POTASSIUM CHLORIDE CRYS ER 20 MEQ PO TBCR
40.0000 meq | EXTENDED_RELEASE_TABLET | Freq: Once | ORAL | Status: AC
  Administered 2016-10-17: 40 meq via ORAL
  Filled 2016-10-17: qty 2

## 2016-10-17 MED ORDER — ASPIRIN EC 81 MG PO TBEC: 81.0000 mg | DELAYED_RELEASE_TABLET | Freq: Every day | ORAL | 3 refills | Status: DC

## 2016-10-17 MED ORDER — AMLODIPINE BESYLATE 2.5 MG PO TABS
2.5000 mg | ORAL_TABLET | Freq: Every day | ORAL | Status: DC
Start: 2016-10-17 — End: 2016-10-17
  Administered 2016-10-17: 2.5 mg via ORAL
  Filled 2016-10-17: qty 1

## 2016-10-17 MED ORDER — AMLODIPINE BESYLATE 2.5 MG PO TABS: 2.5000 mg | ORAL_TABLET | Freq: Every day | ORAL | 0 refills | Status: AC

## 2016-10-17 NOTE — Progress Notes (Addendum)
   Subjective: Voice is clear and she is free of complaints except her sacrum is hurting and that the breakfast and in the so great. She is excited to go back to [facility] today.  Objective:  Vital signs in last 24 hours: Vitals:   10/17/16 0038 10/17/16 0048 10/17/16 0544 10/17/16 0907  BP: (!) 201/101 (!) 175/96 (!) 186/100 (!) (P) 186/105  Pulse: 84 82 81 (P) 98  Resp: 20  20 (P) 19  Temp: 97.8 F (36.6 C)  98.5 F (36.9 C) (P) 98.6 F (37 C)  TempSrc: Axillary  Oral (P) Oral  SpO2: 96% 96% 98% (P) 99%   Physical Exam  Constitutional: She is oriented to person, place, and time and well-developed, well-nourished, and in no distress. No distress.  HENT:  Head: Normocephalic and atraumatic.  Eyes: Conjunctivae are normal. Right eye exhibits no discharge. Left eye exhibits no discharge. No scleral icterus.  Cardiovascular: Normal rate and regular rhythm.   No murmur heard. Pulmonary/Chest: Effort normal and breath sounds normal. No respiratory distress. She has no wheezes. She has no rales.  Abdominal: Soft. She exhibits no distension. There is no tenderness. There is no guarding.  Neurological: She is alert and oriented to person, place, and time.  Left sided facial droop  Skin: Skin is warm and dry. She is not diaphoretic.  Psychiatric: Affect and judgment normal.   Assessment/Plan:    CVA (cerebral vascular accident) ([place])   HYPERLIPIDEMIA   Dementia Strength of her voice and hand strength are improvement. Mild left facial droop is still observed. PT feels it would be best for her to return to [place] and her son and I agree with this. Hemoglobin A1c was 5.3 and LDL 73. -dysphagia I diet and aspiration precautions - aspirin 81 mg daily for 21-90 days  - start plavix 75 mg daily   -pravastatin 20 mg daily     Essential hypertension Allowed for permissive hypertension for the first 24 hours, she remains hypertensive today despite restarting home med losartan.  home  medication losartan 100 mg daily was restarted yesterday  Starting amlodipine2.5 mg daily today  Pressure will need to be monitored after discharge.  Hypokalemia Repleted with 40 mEq of potassium  Dispo: Anticipated discharge in approximately 1-2 day(s).   Ledell Noss, MD 10/17/2016, 10:47 AM Pager: [phone]

## 2016-10-17 NOTE — Progress Notes (Signed)
Modified Barium Swallow Progress Note  Patient Details  Name: Rebecca Knight MRN: [id number] Date of Birth: 03-10-1923  Today's Date: 10/17/2016  Modified Barium Swallow completed.  Full report located under Chart Review in the Imaging Section.  Brief recommendations include the following:  Clinical Impression  Pt with a moderate-severe oral, mild pharyngeal dysphagia very similar to MBS in February 2018; suspect swallow function at or near baseline. Pt with significantly delayed oral transit of bolus across consistencies, delay in swallow initiation to the level of the vallecula, and reduced tongue base retraction resulting in flash penetration of both thin and nectar-thick liquid consistencies; no significant difference noted on cup vs straw sips or sip size. No deep penetration or aspiration noted. Pt also with mild-moderate residuals in the vallecula across consistencies which pt occasionally cleared independently with second swallow. Pt with significantly prolonged mastication of solid consistency and piecemeal swallowing of solid and puree. Pt is at an increased risk of aspiration given these findings. Recommend continuing dysphagia 1 and advancing liquids to thin, meds crushed in puree, full supervision to minimize distractions, ensure pt upright, assist with feeding, cue small sips at a time. Will continue to follow for diet tolerance.   Swallow Evaluation Recommendations       SLP Diet Recommendations: Dysphagia 1 (Puree) solids;Thin liquid   Liquid Administration via: Cup;Straw   Medication Administration: Crushed with puree   Supervision: Staff to assist with self feeding;Full supervision/cueing for compensatory strategies   Compensations: Minimize environmental distractions;Slow rate;Small sips/bites   Postural Changes: Seated upright at 90 degrees   Oral Care Recommendations: Oral care BID        Kern Reap, MA, CCC-SLP 10/17/2016,2:09 PM  [phone]

## 2016-10-17 NOTE — Clinical Social Work Note (Signed)
Clinical Social Work Assessment  Patient Details  Name: Rebecca Knight MRN: [id number] Date of Birth: [birth year]  Date of referral:  10/16/16               Reason for consult:  Facility Placement, Discharge Planning                Permission sought to share information with:  Facility Sport and exercise psychologist, Family Supports Permission granted to share information::  Yes, Verbal Permission Granted  Name::     [employer]::  [facility]  Relationship::  Son  Sport and exercise psychologist Information:     Housing/Transportation Living arrangements for the past 2 months:  [place] of Information:  Patient Patient Interpreter Needed:  None Criminal Activity/Legal Involvement Pertinent to Current Situation/Hospitalization:  No - Comment as needed Significant Relationships:  Adult Children Lives with:  Self, Facility Resident Do you feel safe going back to the place where you live?  Yes Need for family participation in patient care:  No (Coment)  Care giving concerns:  Patient has been living at [facility] long term, and has no concerns about care received there.    Social Worker assessment / plan:  CSW introduced self to patient and explained role. Patient confirmed that she has been living at [facility] for a while, and she likes living there. Patient confirmed wanting to go back when ready to discharge, and she wanted to go back today. CSW contacted [facility] to make sure the facility would take the patient back, and [facility] indicated that they would accept the patient pending insurance approval. CSW initiated insurance authorization with [employer]. CSW will follow to facilitate discharge.  Employment status:  Retired Nurse, adult PT Recommendations:    Information / Referral to community resources:  [place]  Patient/Family's Response to care:  Patient agreeable to return to [facility].  Patient/Family's  Understanding of and Emotional Response to Diagnosis, Current Treatment, and Prognosis:  Patient expressed appreciating the level of care received at [facility], she said "they are really good to me over there". Patient indicated understanding of CSW role in discharge planning.  Emotional Assessment Appearance:  Appears stated age Attitude/Demeanor/Rapport:    Affect (typically observed):  Appropriate Orientation:  Oriented to Situation, Oriented to  Time, Oriented to Place, Oriented to Self Alcohol / Substance use:  Not Applicable Psych involvement (Current and /or in the community):  No (Comment)  Discharge Needs  Concerns to be addressed:  Care Coordination, Discharge Planning Concerns Readmission within the last 30 days:  No Current discharge risk:  Physical Impairment Barriers to Discharge:  No Barriers Identified   Geralynn Ochs, LCSW 10/17/2016, 2:51 PM

## 2016-10-17 NOTE — Progress Notes (Signed)
Internal Medicine Attending:   I saw and examined the patient. I reviewed the resident's note and I agree with the resident's findings and plan as documented in the resident's note. Limited CVA workup, will again discontinue echo per family wishes.  Ok to be discharged today back to SNF. Agree with antiplatelet therapy as discussed by Dr Hetty Ely and ongoing BP management.  May consider adding low dose spironolactone in the future given mild hypokalemia.

## 2016-10-17 NOTE — Progress Notes (Signed)
Pt d/c to skilled nurse facility [facility]. Report given to [facility]. Pt has no new concerns. Pt transported from hospital by [facility]

## 2016-10-17 NOTE — Clinical Social Work Placement (Signed)
   CLINICAL SOCIAL WORK PLACEMENT  NOTE  Date:  10/17/2016  Patient Details  Name: Rebecca Knight MRN: [id number] Date of Birth: 07/21/1922  Clinical Social Work is seeking post-discharge placement for this patient at the [place] level of care (*CSW will initial, date and re-position this form in  chart as items are completed):      Patient/family provided with [place] Work Department's list of facilities offering this level of care within the geographic area requested by the patient (or if unable, by the patient's family).      Patient/family informed of their freedom to choose among providers that offer the needed level of care, that participate in Medicare, Medicaid or managed care program needed by the patient, have an available bed and are willing to accept the patient.      Patient/family informed of [facility]'s ownership interest in [facility] and [facility], as well as of the fact that they are under no obligation to receive care at these facilities.  PASRR submitted to EDS on       PASRR number received on       Existing PASRR number confirmed on 10/17/16     FL2 transmitted to all facilities in geographic area requested by pt/family on       FL2 transmitted to all facilities within larger geographic area on 10/17/16     Patient informed that his/her managed care company has contracts with or will negotiate with certain facilities, including the following:            Patient/family informed of bed offers received.  Patient chooses bed at [facility]     Physician recommends and patient chooses bed at      Patient to be transferred to [facility] on 10/17/16.  Patient to be transferred to facility by [facility]     Patient family notified on 10/17/16 of transfer.  Name of family member notified:  Daughter-in-Law     PHYSICIAN       Additional Comment:     _______________________________________________ Geralynn Ochs, LCSW 10/17/2016, 3:52 PM

## 2016-10-17 NOTE — NC FL2 (Signed)
[place] LEVEL OF CARE SCREENING TOOL     IDENTIFICATION  Patient Name: Rebecca Knight Birthdate: Aug 29, 1922 Sex: female Admission Date (Current Location): 10/15/2016  [facility] and [place] Number:  Herbalist and Address:  [facility] [place] [address] [place], [place] [address]      Provider Number: [id number]  Attending Physician Name and Address:  Lucious Groves, DO  Relative Name and Phone Number:       Current Level of Care: Hospital Recommended Level of Care: [place] Prior Approval Number:    Date Approved/Denied:   PASRR Number: [id number]  Discharge Plan: SNF    Current Diagnoses: Patient Active Problem List   Diagnosis Date Noted  . Pressure ulcer 10/16/2016  . CVA (cerebral vascular accident) ([place]) 10/15/2016  . Sepsis secondary to UTI ([place])   . Dysphagia   . Metabolic encephalopathy   . Dyslipidemia   . Hypokalemia 05/24/2016  . Hypernatremia   . Pressure injury of skin 05/22/2016  . Sepsis ([place]) 05/21/2016  . UTI (urinary tract infection) 05/21/2016  . Acute kidney injury ([place]) 05/21/2016  . Dehydration, severe 05/21/2016  . History of right hip hemiarthroplasty 04/19/16 05/21/2016  . Dementia 05/21/2016  . Complicated UTI (urinary tract infection)   . Displaced intertrochanteric fracture of right femur, initial encounter for closed fracture ([place]) 04/30/2016  . Displaced fracture of greater trochanter of right femur, initial encounter for closed fracture ([place])   . Closed displaced fracture of right femoral neck ([place]) 04/18/2016  . Confusion   . Stroke ([place])   . Syncope and collapse   . Cerebral thrombosis with cerebral infarction ([place]) 12/25/2014  . Acute encephalopathy 12/23/2014  . Urinary incontinence 02/10/2012  . Aneurysm of abdominal vessel ([place]) 04/13/2010  . CHOLELITHIASIS 04/13/2010  . NONSPEC ELEVATION OF LEVELS OF TRANSAMINASE/LDH 04/13/2010  . DIVERTICULOSIS, COLON  06/14/2008  . COLON CANCER, HX OF 06/14/2008  . THYROID NODULE, RIGHT 01/26/2007  . HYPERLIPIDEMIA 01/26/2007  . OSTEOPOROSIS NOS 01/26/2007  . POSTHERPETIC NEURALGIA 11/20/2006  . Essential hypertension 11/20/2006    Orientation RESPIRATION BLADDER Height & Weight     Self, Time, Situation, Place  Normal Incontinent, External catheter Weight:   Height:     BEHAVIORAL SYMPTOMS/MOOD NEUROLOGICAL BOWEL NUTRITION STATUS      Incontinent    AMBULATORY STATUS COMMUNICATION OF NEEDS Skin   Extensive Assist Verbally PU Stage and Appropriate Care   PU Stage 2 Dressing:  (unknown)                   Personal Care Assistance Level of Assistance  Bathing, Dressing Bathing Assistance: Maximum assistance   Dressing Assistance: Maximum assistance     Functional Limitations Info             SPECIAL CARE FACTORS FREQUENCY  PT (By licensed PT), OT (By licensed OT)     PT Frequency: 5x/wk OT Frequency: 5x/wk            Contractures      Additional Factors Info  Code Status, Allergies Code Status Info: DNR Allergies Info: Penicillins           Current Medications (10/17/2016):  This is the current hospital active medication list Current Facility-Administered Medications  Medication Dose Route Frequency Provider Last Rate Last Dose  . acetaminophen (TYLENOL) tablet 650 mg  650 mg Oral Q4H PRN Jule Ser, DO       Or  . acetaminophen (TYLENOL)  solution 650 mg  650 mg Per Tube Q4H PRN Jule Ser, DO       Or  . acetaminophen (TYLENOL) suppository 650 mg  650 mg Rectal Q4H PRN Jule Ser, DO      . amLODipine (NORVASC) tablet 2.5 mg  2.5 mg Oral Daily Ledell Noss, MD      . aspirin EC tablet 325 mg  325 mg Oral Daily Jule Ser, DO   325 mg at 10/16/16 1036  . enoxaparin (LOVENOX) injection 40 mg  40 mg Subcutaneous Q24H Jule Ser, DO   40 mg at 10/16/16 1629  . hydrALAZINE (APRESOLINE) injection 5 mg  5 mg Intravenous Q8H PRN Ledell Noss, MD       . metoprolol succinate (TOPROL-XL) 24 hr tablet 25 mg  25 mg Oral Daily Jule Ser, DO   25 mg at 10/16/16 1036  . multivitamin with minerals tablet 1 tablet  1 tablet Oral Daily Jule Ser, DO   1 tablet at 10/16/16 1036  . potassium chloride SA (K-DUR,KLOR-CON) CR tablet 40 mEq  40 mEq Oral Once Ledell Noss, MD      . pravastatin (PRAVACHOL) tablet 20 mg  20 mg Oral q1800 Jule Ser, DO   20 mg at 10/16/16 1628  . RESOURCE THICKENUP CLEAR   Oral PRN Lucious Groves, DO      . senna-docusate (Senokot-S) tablet 1 tablet  1 tablet Oral QHS PRN Jule Ser, DO      . vitamin C (ASCORBIC ACID) tablet 500 mg  500 mg Oral BID Jule Ser, DO   500 mg at 10/16/16 1036     Discharge Medications: Please see discharge summary for a list of discharge medications.  Relevant Imaging Results:  Relevant Lab Results:   Additional Information SS#: [id number]  Geralynn Ochs, LCSW

## 2016-10-17 NOTE — Progress Notes (Signed)
STROKE TEAM PROGRESS NOTE   HISTORY OF PRESENT ILLNESS (per record) Rebecca Knight is an 83 y.o. female with a hx of AAA, rectal and colon cancer (in remission), heart murmur, and old strokes with no residual symptoms on aspirin who presented 10/15/2016 to [facility] after waking up feeling different and having some speech difficulty.  At baseline cannot she walk due to R hip frx, and lives with her son.  CTH/ CTA head and neck- negative for any acute finding and LVO.  MR head with acute infarct in right external capsule and radiating white matter tracts. LKW: 2000 on 10/14/2016  Patient was not administered IV t-PA secondary to presenting outside of the treatment window.  She was admitted to General Neurology for further evaluation and treatment.   SUBJECTIVE (INTERVAL HISTORY) She is awake, alert, and follows all commands appropriately.  The patient continues to have LUE weakness, L facial droop, and slurred speech but it is improving. MRI confirms right external capsule infarct   OBJECTIVE Temp:  [97.8 F (36.6 C)-98.7 F (37.1 C)] (P) 98.6 F (37 C) (06/28 0907) Pulse Rate:  [81-98] (P) 98 (06/28 0907) Cardiac Rhythm: Normal sinus rhythm (06/28 0700) Resp:  [19-20] (P) 19 (06/28 0907) BP: (168-201)/(96-125) (P) 186/105 (06/28 0907) SpO2:  [96 %-99 %] (P) 99 % (06/28 0907)  CBC:   Recent Labs Lab 10/15/16 1159 10/15/16 1206  WBC 9.3  --   NEUTROABS 6.8  --   HGB 12.5 13.3  HCT 41.0 39.0  MCV 82.3  --   PLT 206  --     Basic Metabolic Panel:   Recent Labs Lab 10/15/16 1159 10/15/16 1206 10/17/16 0502  NA 143 143 141  K 3.7 3.7 3.1*  CL 104 102 103  CO2 30  --  28  GLUCOSE 113* 107* 112*  BUN 14 17 15   CREATININE 0.72 0.70 0.70  CALCIUM 10.2  --  10.3    Lipid Panel:     Component Value Date/Time   CHOL 141 10/16/2016 0618   TRIG 116 10/16/2016 0618   HDL 45 10/16/2016 0618   CHOLHDL 3.1 10/16/2016 0618   VLDL 23 10/16/2016 0618   LDLCALC 73 10/16/2016 0618    HgbA1c:  Lab Results  Component Value Date   HGBA1C 5.3 10/16/2016   Urine Drug Screen:     Component Value Date/Time   LABOPIA NONE DETECTED 12/23/2014 1251   COCAINSCRNUR NONE DETECTED 12/23/2014 1251   LABBENZ NONE DETECTED 12/23/2014 1251   AMPHETMU NONE DETECTED 12/23/2014 1251   THCU NONE DETECTED 12/23/2014 1251   LABBARB NONE DETECTED 12/23/2014 1251    Alcohol Level     Component Value Date/Time   ETH <5 12/23/2014 1200    IMAGING Ct Angio Head W and Wo Contrast 10/15/2016 IMPRESSION: 1. Negative for emergent large vessel occlusion. 2. Positive for moderate to severe distal right ICA stenosis, due to calcified plaque at the distal supraclinoid segment. The right ICA terminus and right anterior circulation remain patent. 3. Mild to moderate right PCA atherosclerotic stenosis. 4. Extracranial arteries are tortuous, but with minimal atherosclerosis and there is no significant stenosis in the neck. 5. Ectatic aortic arch and proximal descending thoracic aorta with mild to moderate calcified atherosclerosis.  Mr Brain Wo Contrast 10/16/2016 IMPRESSION: Acute infarction in the right external capsule/ radiating white matter tracts. Extensive chronic small vessel ischemic changes elsewhere throughout the brain as outlined above.   Ct Head Code Stroke W/o Cm 10/15/2016 IMPRESSION: 1. No  acute cortically based infarct or acute intracranial hemorrhage. Chronic small vessel disease appears stable since January. 2. ASPECTS is 10.     PHYSICAL EXAM elderly pleasant frail Caucasianl ady not in distress.  . Afebrile. Head is nontraumatic. Neck is supple without bruit.    Cardiac exam no murmur or gallop. Lungs are clear to auscultation. Distal pulses are well felt. Neurological Exam :  Awake alert oriented x 3 normal speech and language. Mild left lower face asymmetry. Tongue midline. No drift. Mild diminished fine finger movements on left. Orbits right over left upper extremity.  Mild left grip weak..left lower extremity 3/5 strength. Right lower extremity mild hip flexion weakness due to pain Normal sensation . Normal coordination.Gait deferred  ASSESSMENT/PLAN [name] is a 83 y.o. female with history of AAA, rectal and colon cancer (in remission), heart murmur, and old strokes with no residual symptoms on aspirin  presenting with left arm weakness, left facial droop, and slurred speech. She did not receive IV t-PA due to arriving outside of the treatment window.   Stroke:  acute infarct in right external capsule and radiating white matter tracts in the setting of extensive chronic small vessel ischemic changes.  Resultant  Left hemi-paresis  CT head: no stroke  MRI head:  acute infarct in right external capsule and radiating white matter tracts  Carotid Doppler  Not ordered   2D Echo   ordered    LDL 73  HgbA1c 5.5  Lovenox 40 mg sq daily for VTE prophylaxis DIET - DYS 1 Room service appropriate? Yes; Fluid consistency: Nectar Thick Diet - low sodium heart healthy  aspirin 81 mg daily prior to admission, now on  Plavix 75 mg daily  Patient counseled to be compliant with her antithrombotic medications  Ongoing aggressive stroke risk factor management  Therapy recommendations: SNF   Disposition:  Pending   Hypertension  Stable:   Permissive hypertension (OK if < 220/120) but gradually normalize in 5-7 days  Long-term BP goal normotensive  Other Stroke Risk Factors  Advanced age  Hx stroke/TIA  Other Active Problems  None  Hospital day # 2  I have personally examined this patient, reviewed notes, independently viewed imaging studies, participated in medical decision making and plan of care.ROS completed by me personally and pertinent positives fully documented  I have made any additions or clarifications directly to the above note.  She presented with left face and body weakness secondary to right brain subcortical infarct  likely from small vessel disease. Recommend Change aspirin to Plavix 75 mg daily  . No family available at bedside. D/w dr Hetty Ely.Greater than 50% time during this  25 minute visit was spent on counseling and coordination of care about stroke and discussion about treatment and prevention options.  F/u as outpatient in stroke clinic in 6 weeks Antony Contras, MD Medical Director [place] Pager: [phone] 10/17/2016 1:05 PM   To contact Stroke Continuity provider, please refer to [email or web address] After hours, contact General Neurology

## 2016-10-17 NOTE — Progress Notes (Signed)
Pt d/c to skilled nurse facility. No new concerns, pt will be transported from hospital by [facility]

## 2016-10-17 NOTE — Discharge Summary (Signed)
Name: Rebecca Knight MRN: [id number] DOB: [birth year] 83 y.o. PCP: [name], DO  Date of Admission: 10/15/2016 11:56 AM Date of Discharge: 10/17/2016 Attending Physician: Lucious Groves, DO  Discharge Diagnosis: 1. CVA (cerebral vascular accident) ([place])   Pressure ulcer   Discharge Medications: Allergies as of 10/17/2016      Reactions   Penicillins Rash   Has patient had a PCN reaction causing immediate rash, facial/tongue/throat swelling, SOB or lightheadedness with hypotension: Yes Has patient had a PCN reaction causing severe rash involving mucus membranes or skin necrosis: Yes Has patient had a PCN reaction that required hospitalization No Has patient had a PCN reaction occurring within the last 10 years: No If all of the above answers are "NO", then may proceed with Cephalosporin use.      Medication List    TAKE these medications   acetaminophen 325 MG tablet Commonly known as:  TYLENOL Take 2 tablets (650 mg total) by mouth every 6 (six) hours as needed for mild pain (or Fever >/= 101).   amLODipine 2.5 MG tablet Commonly known as:  NORVASC Take 1 tablet (2.5 mg total) by mouth daily. Start taking on:  10/18/2016   aspirin EC 81 MG tablet Take 1 tablet (81 mg total) by mouth daily. Stop taking January 17, 2017 What changed:  additional instructions   clopidogrel 75 MG tablet Commonly known as:  PLAVIX Take 1 tablet (75 mg total) by mouth daily.   gabapentin 100 MG capsule Commonly known as:  NEURONTIN Take 100-300 mg by mouth 3 (three) times daily. 100mg  twice daily, 300mg  at bedtime   losartan 100 MG tablet Commonly known as:  COZAAR Take 1 tablet by mouth daily   magnesium hydroxide 400 MG/5ML suspension Commonly known as:  MILK OF MAGNESIA Take 30 mLs by mouth daily as needed for mild constipation.   metoprolol succinate 25 MG 24 hr tablet Commonly known as:  TOPROL-XL Take 1 tablet (25 mg total) by mouth daily.   mirtazapine 15 MG  disintegrating tablet Commonly known as:  REMERON SOL-TAB Take 7.5 mg by mouth at bedtime.   multivitamins ther. w/minerals Tabs tablet Take 1 tablet by mouth daily.   pravastatin 20 MG tablet Commonly known as:  PRAVACHOL 1 BY MOUTH DAILY   senna 8.6 MG tablet Commonly known as:  SENOKOT Take 1 tablet (8.6 mg total) by mouth 2 (two) times daily as needed for constipation.   SYSTANE 0.4-0.3 % Soln Generic drug:  Polyethyl Glycol-Propyl Glycol Apply 1 drop to eye 4 (four) times daily. Reported on 05/13/2015   vitamin C 500 MG tablet Commonly known as:  ASCORBIC ACID Take 500 mg by mouth 2 (two) times daily.       Disposition and follow-up:   Ms.Lyndy G Stecklein was discharged from [facility] in Stable condition.  At the hospital follow up visit please address:  1.  Stroke - started plavix 75 mg daily, she should continue taking aspirin until 21 days - 3 months after this admission.   Hypertension - BP uncontrolled (SBP 190s) despite home med losartan, started amlodipine, please monitor BP and adjust meds as needed.   2.  Labs / imaging needed at time of follow-up: BMP ( potassium)   3.  Pending labs/ test needing follow-up: none   Follow-up Appointments: Follow-up Information    [name], DO. Schedule an appointment as soon as possible for a visit in 1 week(s).   Specialty:  Family Medicine Contact  information: [place] [address] [place]  [address] [phone]           Hospital Course by problem list:    CVA (cerebral vascular accident) [facility])   Dyslipidemia Ms. daily is a 83 year old lady with a medical history of dementia, hypertension, dyslipidemia, prior CVAs,  history of right hip arthroplasty in December 2017 and colon cancer who presented from [facility] with left hand weakness, left facial droop and dysarthria. Her last known normal was the evening prior to admission. In the ED code stroke was called CT of the head was  negative for acute normality CTA showed ICA and PCA stenosis but was negative for large vessel occlusion. The following morning her voice and hand weakness had improved, she had only a mild facial droop. MRI showed acute infarct of the right external capsule and radiating white matter tracts with diffuse chronic small vessel ischemic changes. A1c was 5.3 and LDL 73. Goals of care discussion with her son resulted in deferment of further stroke workup. PT worked with her and recommended return to [employer]. Her home medication pravastatin 20 mg daily was continued. Aspirin was continued and Plavix was added the plan is to stop aspirin after 21 -90 days.     Essential hypertension Initially we opted for permissive hypertension. On the second day of admission her home medication losartan was restarted, she remained hypertensive so Amlodipine 2.5 mg was started. Unclear if this is a worsening of her hypertension and it would be better if amlodipine could be discontinued so this will need to be monitored at SNF.     History of right hip hemiarthroplasty 04/19/16 Her son reports that she continues to have right hip discomfort since the surgery she has also been wheelchair-bound and had progressive deterioration since then.    Pressure ulcer Sacral ulcer was discovered at the time of admission wound care was consultation and gave their recommendations for this to her nurse. She had some pain related to this ulcer.  A new most form was completed with her son this admission and placed in her chart, he has made her DNR and opted for comfort care if she were to decompensate.   Discharge Vitals:   BP (!) (P) 186/105 (BP Location: Right Arm)   Pulse (P) 98   Temp (P) 98.6 F (37 C) (Oral)   Resp (P) 19   SpO2 (P) 99%   Pertinent Labs, Studies, and Procedures:   CT head 10/15/2016  IMPRESSION: 1. No acute cortically based infarct or acute intracranial hemorrhage. Chronic small vessel disease appears  stable since January. 2. ASPECTS is 10.  CTA head 6/26  IMPRESSION: 1. Negative for emergent large vessel occlusion. 2. Positive for moderate to severe distal right ICA stenosis, due to calcified plaque at the distal supraclinoid segment. The right ICA terminus and right anterior circulation remain patent. 3. Mild to moderate right PCA atherosclerotic stenosis. 4. Extracranial arteries are tortuous, but with minimal atherosclerosis and there is no significant stenosis in the neck. 5. Ectatic aortic arch and proximal descending thoracic aorta with mild to moderate calcified atherosclerosis. 6. This study was preliminarily reviewed with Dr. Cristobal Goldmann at 1220 hours on 10/15/2016.  MRI brain 10/16/3016  IMPRESSION: Acute infarction in the right external capsule/ radiating white matter tracts.  Extensive chronic small vessel ischemic changes elsewhere throughout the brain as outlined above.  Discharge Instructions: Discharge Instructions    Diet - low sodium heart healthy    Complete by:  As directed  Discharge instructions    Complete by:  As directed    Clide Deutscher,   It has been a pleasure working with you and we are glad you're feeling better.   For your stroke, Continue taking aspirin and a statin  START taking plavix 75 mg daily  CONTINUE taking aspirin 81 daily, stop taking this after 3 months (around 01/17/2017)   For your hypertension,   START taking Amlodipine 2.5 mg daily  Continue taking losartan and metoprolol    Follow up with your primary care provider in 1-2 weeks  If your symptoms worsen or you develop new symptoms, please seek medical help whether it is your primary care provider or emergency department.  If you have any questions about this hospitalization please call [phone].   Increase activity slowly    Complete by:  As directed       Signed: Ledell Noss, MD 10/17/2016, 11:19 AM   Pager: [phone]

## 2016-10-17 NOTE — Progress Notes (Signed)
Discharge to: [facility] Anticipated discharge date: 10/17/16 Family notified: Yes, at bedside Transportation by: [facility]  Report #: [phone], Room [place] signing off.  Laveda Abbe LCSW [phone]

## 2016-10-17 NOTE — Progress Notes (Signed)
  Speech Language Pathology Treatment: Dysphagia  Patient Details Name: RINOA GARRAMONE MRN: [id number] DOB: [birth year] Today's Date: 10/17/2016 Time: 2130-8657 SLP Time Calculation (min) (ACUTE ONLY): 15 min  Assessment / Plan / Recommendation Clinical Impression  Dysphagia treatment provided to check diet tolerance/ consider advancement. Upon entering room, pt had a chunk of food in oral cavity which pt cleared when provided liquid rinse. Pt tolerated trials of nectar-thick and puree consistencies without overt s/s of aspiration; however, pt continues to have a prolonged oral phase and suspect a delayed swallow initiation. Provided trials of thin liquids which pt tolerated without any coughing today; however, did note some subtle changes in vocal quality. Recommend proceeding with MBS to objectively evaluate swallow function given hx of difficulty with thin liquids per MBS in Feb 2018. Until then, continue Dysphagia 1 diet/ nectar-thick liquids with meds crushed in puree, full supervision to assist with feeding/ ensure pt does not have oral residuals at end of meal. Plan for MBS this afternoon.  HPI HPI: Pt is a 83 yo female admitted for slurred speech and R facial droop. CT Head negative; MRI pending. She had a prior MBS in February 2018 recommending Dys 1 diet and thin liquids by cup only due to prolonged oral phase and penetration of larger sips of thin liquids. Previous cognitive evaluation in 2016 showed impaired attention and memory (she was living at ALF at the time). PMH includes dementia, urinary incontinence, HTN, dyslipidemia, CVA, colon cancer, anxiety      SLP Plan  MBS       Recommendations  Diet recommendations: Dysphagia 1 (puree);Nectar-thick liquid Liquids provided via: Cup;Straw Medication Administration: Crushed with puree Supervision: Full supervision/cueing for compensatory strategies Compensations: Minimize environmental distractions;Slow rate;Small sips/bites Postural  Changes and/or Swallow Maneuvers: Seated upright 90 degrees                Oral Care Recommendations: Oral care BID Follow up Recommendations: Skilled Nursing facility SLP Visit Diagnosis: Dysphagia, unspecified (R13.10) Plan: [place], [place], [place] 10/17/2016, 10:45 AM [id number]

## 2016-10-21 ENCOUNTER — Emergency Department (HOSPITAL_COMMUNITY): Payer: Medicare Other

## 2016-10-21 ENCOUNTER — Encounter (HOSPITAL_COMMUNITY): Payer: Self-pay | Admitting: Emergency Medicine

## 2016-10-21 ENCOUNTER — Inpatient Hospital Stay (HOSPITAL_COMMUNITY)
Admission: EM | Admit: 2016-10-21 | Discharge: 2016-10-24 | DRG: 065 | Disposition: A | Discharge status: Skilled Nursing Facility | Payer: Medicare Other | Attending: Internal Medicine | Admitting: Internal Medicine

## 2016-10-21 DIAGNOSIS — Z85048 Personal history of other malignant neoplasm of rectum, rectosigmoid junction, and anus: Secondary | ICD-10-CM

## 2016-10-21 DIAGNOSIS — R471 Dysarthria and anarthria: Secondary | ICD-10-CM | POA: Diagnosis present

## 2016-10-21 DIAGNOSIS — Z833 Family history of diabetes mellitus: Secondary | ICD-10-CM

## 2016-10-21 DIAGNOSIS — R4182 Altered mental status, unspecified: Secondary | ICD-10-CM | POA: Diagnosis not present

## 2016-10-21 DIAGNOSIS — I6381 Other cerebral infarction due to occlusion or stenosis of small artery: Secondary | ICD-10-CM

## 2016-10-21 DIAGNOSIS — Z85038 Personal history of other malignant neoplasm of large intestine: Secondary | ICD-10-CM

## 2016-10-21 DIAGNOSIS — Z7902 Long term (current) use of antithrombotics/antiplatelets: Secondary | ICD-10-CM

## 2016-10-21 DIAGNOSIS — Z880 Allergy status to penicillin: Secondary | ICD-10-CM

## 2016-10-21 DIAGNOSIS — I63211 Cerebral infarction due to unspecified occlusion or stenosis of right vertebral arteries: Secondary | ICD-10-CM

## 2016-10-21 DIAGNOSIS — Z8673 Personal history of transient ischemic attack (TIA), and cerebral infarction without residual deficits: Secondary | ICD-10-CM

## 2016-10-21 DIAGNOSIS — I714 Abdominal aortic aneurysm, without rupture: Secondary | ICD-10-CM | POA: Diagnosis present

## 2016-10-21 DIAGNOSIS — Z515 Encounter for palliative care: Secondary | ICD-10-CM | POA: Diagnosis present

## 2016-10-21 DIAGNOSIS — R29810 Facial weakness: Secondary | ICD-10-CM | POA: Diagnosis present

## 2016-10-21 DIAGNOSIS — Z8249 Family history of ischemic heart disease and other diseases of the circulatory system: Secondary | ICD-10-CM

## 2016-10-21 DIAGNOSIS — E871 Hypo-osmolality and hyponatremia: Secondary | ICD-10-CM | POA: Diagnosis present

## 2016-10-21 DIAGNOSIS — R29716 NIHSS score 16: Secondary | ICD-10-CM | POA: Diagnosis present

## 2016-10-21 DIAGNOSIS — I639 Cerebral infarction, unspecified: Principal | ICD-10-CM | POA: Diagnosis present

## 2016-10-21 DIAGNOSIS — B0229 Other postherpetic nervous system involvement: Secondary | ICD-10-CM | POA: Diagnosis present

## 2016-10-21 DIAGNOSIS — I6322 Cerebral infarction due to unspecified occlusion or stenosis of basilar arteries: Secondary | ICD-10-CM

## 2016-10-21 DIAGNOSIS — E785 Hyperlipidemia, unspecified: Secondary | ICD-10-CM | POA: Diagnosis present

## 2016-10-21 DIAGNOSIS — Z7982 Long term (current) use of aspirin: Secondary | ICD-10-CM

## 2016-10-21 DIAGNOSIS — Z823 Family history of stroke: Secondary | ICD-10-CM

## 2016-10-21 DIAGNOSIS — R1310 Dysphagia, unspecified: Secondary | ICD-10-CM | POA: Diagnosis present

## 2016-10-21 DIAGNOSIS — Z96641 Presence of right artificial hip joint: Secondary | ICD-10-CM | POA: Diagnosis present

## 2016-10-21 DIAGNOSIS — M1990 Unspecified osteoarthritis, unspecified site: Secondary | ICD-10-CM | POA: Diagnosis present

## 2016-10-21 DIAGNOSIS — K5730 Diverticulosis of large intestine without perforation or abscess without bleeding: Secondary | ICD-10-CM | POA: Diagnosis present

## 2016-10-21 DIAGNOSIS — Z66 Do not resuscitate: Secondary | ICD-10-CM | POA: Diagnosis present

## 2016-10-21 DIAGNOSIS — Z808 Family history of malignant neoplasm of other organs or systems: Secondary | ICD-10-CM

## 2016-10-21 DIAGNOSIS — I10 Essential (primary) hypertension: Secondary | ICD-10-CM | POA: Diagnosis present

## 2016-10-21 DIAGNOSIS — G8104 Flaccid hemiplegia affecting left nondominant side: Secondary | ICD-10-CM | POA: Diagnosis present

## 2016-10-21 DIAGNOSIS — Z8679 Personal history of other diseases of the circulatory system: Secondary | ICD-10-CM

## 2016-10-21 DIAGNOSIS — M810 Age-related osteoporosis without current pathological fracture: Secondary | ICD-10-CM | POA: Diagnosis present

## 2016-10-21 LAB — I-STAT CHEM 8, ED
BUN: 37 mg/dL — AB (ref 6–20)
CREATININE: 0.9 mg/dL (ref 0.44–1.00)
Calcium, Ion: 1.3 mmol/L (ref 1.15–1.40)
Chloride: 105 mmol/L (ref 101–111)
Glucose, Bld: 124 mg/dL — ABNORMAL HIGH (ref 65–99)
HEMATOCRIT: 41 % (ref 36.0–46.0)
Hemoglobin: 13.9 g/dL (ref 12.0–15.0)
Potassium: 3.8 mmol/L (ref 3.5–5.1)
Sodium: 146 mmol/L — ABNORMAL HIGH (ref 135–145)
TCO2: 31 mmol/L (ref 0–100)

## 2016-10-21 LAB — COMPREHENSIVE METABOLIC PANEL
ALBUMIN: 3.9 g/dL (ref 3.5–5.0)
ALK PHOS: 58 U/L (ref 38–126)
ALT: 10 U/L — AB (ref 14–54)
AST: 22 U/L (ref 15–41)
Anion gap: 11 (ref 5–15)
BILIRUBIN TOTAL: 1 mg/dL (ref 0.3–1.2)
BUN: 36 mg/dL — AB (ref 6–20)
CALCIUM: 10.7 mg/dL — AB (ref 8.9–10.3)
CO2: 30 mmol/L (ref 22–32)
CREATININE: 0.88 mg/dL (ref 0.44–1.00)
Chloride: 106 mmol/L (ref 101–111)
GFR calc Af Amer: >60 mL/min (ref >60)
GFR calc non Af Amer: 55 mL/min — ABNORMAL LOW (ref >60)
Glucose, Bld: 126 mg/dL — ABNORMAL HIGH (ref 65–99)
Potassium: 3.8 mmol/L (ref 3.5–5.1)
Sodium: 147 mmol/L — ABNORMAL HIGH (ref 135–145)
TOTAL PROTEIN: 6.8 g/dL (ref 6.5–8.1)

## 2016-10-21 LAB — DIFFERENTIAL
BASOS ABS: 0 10*3/uL (ref 0.0–0.1)
Basophils Relative: 0 %
EOS PCT: 1 %
Eosinophils Absolute: 0.1 10*3/uL (ref 0.0–0.7)
LYMPHS ABS: 2.4 10*3/uL (ref 0.7–4.0)
LYMPHS PCT: 20 %
Monocytes Absolute: 0.7 10*3/uL (ref 0.1–1.0)
Monocytes Relative: 6 %
NEUTROS ABS: 8.8 10*3/uL — AB (ref 1.7–7.7)
NEUTROS PCT: 73 %

## 2016-10-21 LAB — CBC
HCT: 43.2 % (ref 36.0–46.0)
HEMOGLOBIN: 13.2 g/dL (ref 12.0–15.0)
MCH: 25.6 pg — AB (ref 26.0–34.0)
MCHC: 30.6 g/dL (ref 30.0–36.0)
MCV: 83.9 fL (ref 78.0–100.0)
Platelets: 203 10*3/uL (ref 150–400)
RBC: 5.15 MIL/uL — AB (ref 3.87–5.11)
RDW: 16.7 % — ABNORMAL HIGH (ref 11.5–15.5)
WBC: 11.9 10*3/uL — AB (ref 4.0–10.5)

## 2016-10-21 LAB — APTT: aPTT: 29 seconds (ref 24–36)

## 2016-10-21 LAB — PROTIME-INR
INR: 1.14
Prothrombin Time: 14.6 seconds (ref 11.4–15.2)

## 2016-10-21 LAB — MRSA PCR SCREENING: MRSA BY PCR: NEGATIVE

## 2016-10-21 LAB — I-STAT TROPONIN, ED: Troponin i, poc: 0 ng/mL (ref 0.00–0.08)

## 2016-10-21 LAB — ETHANOL: Alcohol, Ethyl (B): <5 mg/dL (ref <5)

## 2016-10-21 MED ORDER — ACETAMINOPHEN 650 MG RE SUPP: 650.0000 mg | RECTAL | Status: DC | PRN

## 2016-10-21 MED ORDER — LABETALOL HCL 5 MG/ML IV SOLN: 5.0000 mg | INTRAVENOUS | Status: DC | PRN

## 2016-10-21 MED ORDER — POLYETHYL GLYCOL-PROPYL GLYCOL 0.4-0.3 % OP SOLN: 1.0000 [drp] | Freq: Four times a day (QID) | OPHTHALMIC | Status: DC

## 2016-10-21 MED ORDER — ACETAMINOPHEN 160 MG/5ML PO SOLN: 650.0000 mg | ORAL | Status: DC | PRN

## 2016-10-21 MED ORDER — STROKE: EARLY STAGES OF RECOVERY BOOK
Freq: Once | Status: AC
  Administered 2016-10-21: 21:00:00
  Filled 2016-10-21: qty 1

## 2016-10-21 MED ORDER — HYPROMELLOSE (GONIOSCOPIC) 2.5 % OP SOLN
1.0000 [drp] | Freq: Four times a day (QID) | OPHTHALMIC | Status: DC
  Administered 2016-10-21 – 2016-10-24 (×10): 1 [drp] via OPHTHALMIC
  Filled 2016-10-21: qty 15

## 2016-10-21 MED ORDER — DEXTROSE-NACL 5-0.45 % IV SOLN
INTRAVENOUS | Status: DC
  Administered 2016-10-21: 21:00:00 via INTRAVENOUS

## 2016-10-21 MED ORDER — ACETAMINOPHEN 325 MG PO TABS: 650.0000 mg | ORAL_TABLET | ORAL | Status: DC | PRN

## 2016-10-21 NOTE — Code Documentation (Signed)
83yo female arriving to [facility] via [place] at 33.  Patient from SNF where she was LKW at 1100.  EMS reports patient with left side flaccid and slurred speech.  Labs drawn and patient cleared for CT by Dr. Alvino Chapel on arrival.  Patient to CT.  Stroke team at the bedside.  CT completed.  NIHSS 16, see documentation for details and code stroke times.  Patient with dysarthria, left facial droop, left hemiplegia and decreased sensation on the left side on exam.  Patient with recent acute infarction in the right external capsule/radiating white matter tracts on 10/15/2016 with resultant left sided weakness, left facial droop and dysarthria.  Patient is contraindicated for treatment with tPA.  No acute stroke treatment at this time.  Bedside handoff with ED RN Carlis Abbott.

## 2016-10-21 NOTE — Consult Note (Signed)
Neurology Consultation Reason for Consult: Left-sided weakness Referring Physician: Alvino Chapel, N  CC: Left-sided weakness  History is obtained from: Chart review  HPI: Rebecca Knight is a 83 y.o. female who was recently admitted for subcortical infarct on the right causing mild left hemiparesis. She did well and was discharged with 3/5 strength. She subsequently has done okay at the nursing home until 11 AM today when she was seen normally. Sometime between 11 AM and 12:30 PM, she developed severe left-sided weakness. She was therefore brought to the emergency department.   LKW: 11 AM tpa given?: no, recent infarct    ROS: Unable to obtain due to altered mental status.   Past Medical History:  Diagnosis Date  . AAA (abdominal aortic aneurysm) (HCC)    [name]  . Anxiety   . Arthritis   . Calculus of gallbladder without mention of cholecystitis or obstruction   . Cancer [facility])    Colon and Rectal  . CVA (cerebral vascular accident) ([place]) 09/2016  . Diverticulosis of colon   . Heart murmur   . Herpes zoster of eye 2002   Dr. Herbert Deaner  . History of colon cancer 1995   Dr Lennie Hummer  . Hyperlipidemia   . Hypertension   . Osteoporosis   . Post herpetic neuralgia   . Varicose veins      Family History  Problem Relation Age of Onset  . Hypertension Father   . Heart attack Father 53  . Stroke Mother        > 72  . Diabetes Sister   . Heart attack Sister 61        her twin  . Diabetes Brother   . Heart attack Brother        in 59s  . Brain cancer Daughter   . Anesthesia problems Neg Hx   . Hypotension Neg Hx   . Malignant hyperthermia Neg Hx   . Pseudochol deficiency Neg Hx      Social History:  reports that she has never smoked. She has never used smokeless tobacco. She reports that she does not drink alcohol or use drugs.   Exam: Current vital signs: BP (!) 174/111 (BP Location: Left Arm)   Pulse 98   Temp 97.8 F (36.6 C) (Axillary)   Resp 16   SpO2  95%  Vital signs in last 24 hours: Temp:  [97.8 F (36.6 C)] 97.8 F (36.6 C) (07/02 1436) Pulse Rate:  [98] 98 (07/02 1436) Resp:  [16] 16 (07/02 1436) BP: (174)/(111) 174/111 (07/02 1436) SpO2:  [95 %] 95 % (07/02 1436)   Physical Exam  Constitutional: Appears Elderly Psych: Affect appropriate to situation Eyes: No scleral injection HENT: No OP obstrucion Head: Normocephalic.  Cardiovascular: Normal rate and regular rhythm.  Respiratory: Effort normal and breath sounds normal to anterior ascultation GI: Soft.  No distension. There is no tenderness.  Skin: WDI  Neuro: Mental Status: Patient is awake, answers some simple questions, but not many. She also follows some simple commands. Cranial Nerves: II: Visual Fields are full. Pupils are equal, round, and reactive to light.   III,IV, VI: She is able to cross midline both directions V: Facial sensation is decreased on the left VII: Facial movement is weak on the left VIII: hearing is intact to voice X: Uvula elevates symmetrically XI: Shoulder shrug is symmetric. XII: tongue is midline without atrophy or fasciculations.  Motor: Tone is normal. Bulk is normal. She has no movement in the  left arm or leg. She moves the right side well, but does not cooperate with formal testing. Sensory: She describes decreased sensation on the left, but does not extinguish Cerebellar: She does not have ataxia on the right arm, does not perform the right leg   I have reviewed labs in epic and the results pertinent to this consultation are: Hyponatremia at 147, mild hypercalcemia at 10.7  I have reviewed the images obtained: CT head-new infarct in the right thalamus which I suspect is present as a hazy diffusion change on the previous MRI  Impression: 83 year old female with likely extension of previous infarct. Given how recent this was, I think that IV TPA would carry significant risk for her and is contraindicated.  According to her  MOST form, she wishes for comfort measures only and therefore I would not perform any workup which would be out of keeping with her wishes.  Recommendations: 1) care only to the extent that patient/family wish 2) if aggressive care is continued, would perform CT angiogram head and neck, continue antiplatelet therapy, pursue PT and OT.  Roland Rack, MD [facility] [phone]  If 7pm- 7am, please page neurology on call as listed in [place].

## 2016-10-21 NOTE — ED Provider Notes (Signed)
[facility] DEPT Provider Note   CSN: [id number] Arrival date & time: 10/21/16  1418   An emergency department physician performed an initial assessment on this suspected stroke patient arrival (Rebecca Knight).  History   Chief Complaint Chief Complaint  Patient presents with  . Code Stroke   Level V caveat due to mental status changes. HPI Rebecca Knight is a 83 y.o. female.  HPI I met the patient immediately upon arrival in the ER although I was not large into the computer on her yet. Brought in as a code stroke. Last known normal was at 11 AM. While patient was eating and noticed she would not use her left side at all. Normally at baseline she has some mucinous. Has had recent stroke left-sided deficits. Patient will not move left arm or leg deformity. Will follow commands. Slurred speech and facial droop and will answer some questions with very difficult to understand. Also met by Dr. Leonel Ramsay from neurology. Past Medical History:  Diagnosis Date  . AAA (abdominal aortic aneurysm) (HCC)    [name]  . Anxiety   . Arthritis   . Calculus of gallbladder without mention of cholecystitis or obstruction   . Cancer [facility])    Colon and Rectal  . CVA (cerebral vascular accident) ([place]) 09/2016  . Diverticulosis of colon   . Heart murmur   . Herpes zoster of eye 2002   Dr. Herbert Deaner  . History of colon cancer 1995   Dr Lennie Hummer  . Hyperlipidemia   . Hypertension   . Osteoporosis   . Post herpetic neuralgia   . Varicose veins     Patient Active Problem List   Diagnosis Date Noted  . Pressure ulcer 10/16/2016  . CVA (cerebral vascular accident) ([place]) 10/15/2016  . Sepsis secondary to UTI ([place])   . Dysphagia   . Metabolic encephalopathy   . Dyslipidemia   . Hypokalemia 05/24/2016  . Hypernatremia   . Pressure injury of skin 05/22/2016  . Sepsis ([place]) 05/21/2016  . UTI (urinary tract infection) 05/21/2016  . Acute kidney injury ([place]) 05/21/2016  . Dehydration, severe  05/21/2016  . History of right hip hemiarthroplasty 04/19/16 05/21/2016  . Dementia 05/21/2016  . Complicated UTI (urinary tract infection)   . Displaced intertrochanteric fracture of right femur, initial encounter for closed fracture ([place]) 04/30/2016  . Displaced fracture of greater trochanter of right femur, initial encounter for closed fracture ([place])   . Closed displaced fracture of right femoral neck ([place]) 04/18/2016  . Confusion   . Stroke ([place])   . Syncope and collapse   . Cerebral thrombosis with cerebral infarction ([place]) 12/25/2014  . Acute encephalopathy 12/23/2014  . Urinary incontinence 02/10/2012  . Aneurysm of abdominal vessel ([place]) 04/13/2010  . CHOLELITHIASIS 04/13/2010  . NONSPEC ELEVATION OF LEVELS OF TRANSAMINASE/LDH 04/13/2010  . DIVERTICULOSIS, COLON 06/14/2008  . COLON CANCER, HX OF 06/14/2008  . THYROID NODULE, RIGHT 01/26/2007  . HYPERLIPIDEMIA 01/26/2007  . OSTEOPOROSIS NOS 01/26/2007  . POSTHERPETIC NEURALGIA 11/20/2006  . Essential hypertension 11/20/2006    Past Surgical History:  Procedure Laterality Date  . COLON SURGERY     RESECTION in 1990s  . ENDOVASCULAR STENT INSERTION  08/08/2011   Procedure: ENDOVASCULAR STENT GRAFT INSERTION;  Surgeon: [name], MD;  Location: [facility] OR;  Service: Vascular;  Laterality: N/A;  Endo Vascular Aortic stent placement with gore  . EYE SURGERY     CAT EXT  OU  . HIP ARTHROPLASTY Right 04/19/2016   Procedure:  ARTHROPLASTY BIPOLAR HIP (HEMIARTHROPLASTY);  Surgeon: [name], MD;  Location: [place];  Service: Orthopedics;  Laterality: Right;  . HIP ARTHROPLASTY Right 04/20/2016   Procedure: REVISION ARTHROPLASTY BIPOLAR HIP (HEMIARTHROPLASTY);  Surgeon: [name], MD;  Location: [place];  Service: Orthopedics;  Laterality: Right;  . HIP CLOSED REDUCTION Right 04/19/2016   Procedure: CLOSED MANIPULATION HIP;  Surgeon: [name], MD;  Location: [facility];  Service: Orthopedics;  Laterality: Right;  .  TONSILLECTOMY      OB History    No data available       Home Medications    Prior to Admission medications   Medication Sig Start Date End Date Taking? Authorizing Provider  amLODipine (NORVASC) 2.5 MG tablet Take 1 tablet (2.5 mg total) by mouth daily. 10/18/16  Yes Ledell Noss, MD  gabapentin (NEURONTIN) 300 MG capsule Take 300 mg by mouth at bedtime. 10/17/16  Yes [provider]  acetaminophen (TYLENOL) 325 MG tablet Take 2 tablets (650 mg total) by mouth every 6 (six) hours as needed for mild pain (or Fever >/= 101). 04/23/16   Domenic Polite, MD  clopidogrel (PLAVIX) 75 MG tablet Take 1 tablet (75 mg total) by mouth daily. 10/17/16 10/17/17  Ledell Noss, MD  gabapentin (NEURONTIN) 100 MG capsule Take 100-300 mg by mouth 3 (three) times daily. 158m twice daily, 3057mat bedtime    [provider]  losartan (COZAAR) 100 MG tablet Take 1 tablet by mouth daily 11/26/13   [provider]  magnesium hydroxide (MILK OF MAGNESIA) 400 MG/5ML suspension Take 30 mLs by mouth daily as needed for mild constipation.    [provider]  metoprolol succinate (TOPROL-XL) 25 MG 24 hr tablet Take 1 tablet (25 mg total) by mouth daily. 05/29/16   VeVelvet BatheMD  mirtazapine (REMERON SOL-TAB) 15 MG disintegrating tablet Take 7.5 mg by mouth at bedtime.    [provider]  Multiple Vitamins-Minerals (MULTIVITAMINS THER. W/MINERALS) TABS tablet Take 1 tablet by mouth daily.    [provider]  Polyethyl Glycol-Propyl Glycol (SYSTANE) 0.4-0.3 % SOLN Apply 1 drop to eye 4 (four) times daily. Reported on 05/13/2015    [provider]  pravastatin (PRAVACHOL) 20 MG tablet 1 BY MOUTH DAILY 10/04/13   HoHendricks LimesMD  senna (SENOKOT) 8.6 MG tablet Take 1 tablet (8.6 mg total) by mouth 2 (two) times daily as needed for constipation. 04/23/16   JoDomenic PoliteMD  vitamin C (ASCORBIC ACID) 500 MG tablet Take 500 mg by mouth 2 (two) times daily.     [provider]    Family History Family History  Problem Relation Age of Onset  . Hypertension Father   . Heart attack Father 6574. Stroke Mother        > 6553. Diabetes Sister   . Heart attack Sister 7851      her twin  . Diabetes Brother   . Heart attack Brother        in 7073s. Brain cancer Daughter   . Anesthesia problems Neg Hx   . Hypotension Neg Hx   . Malignant hyperthermia Neg Hx   . Pseudochol deficiency Neg Hx     Social History Social History  Substance Use Topics  . Smoking status: Never Smoker  . Smokeless tobacco: Never Used  . Alcohol use No     Allergies   Penicillins   Review of Systems Review of Systems  Unable to perform ROS: Patient nonverbal     Physical Exam Updated Vital Signs BP (!) 174/111 (BP Location: Left Arm)   Pulse 98   Temp 97.8 F (36.6 C) (Axillary)   Resp 16   SpO2 95%   Physical Exam  Constitutional: She appears well-developed.  HENT:  Head: Atraumatic.  Eyes: EOM are normal.  Neck: Neck supple.  Cardiovascular: Normal rate.   Pulmonary/Chest: Effort normal.  Abdominal: Soft. There is no tenderness.  Neurological:  Left-sided facial droop. Eye movements grossly intact. Flaccid on left side. Will not raise left lower extremity. Will follow commands on the right side. Some speech but difficult to understand. Complete NIH done by Neurology  Skin: Skin is warm. Capillary refill takes less than 2 seconds.     ED Treatments / Results  Labs (all labs ordered are listed, but only abnormal results are displayed) Labs Reviewed  CBC - Abnormal; Notable for the following:       Result Value   WBC 11.9 (*)    RBC 5.15 (*)    MCH 25.6 (*)    RDW 16.7 (*)    All other components within normal limits  DIFFERENTIAL - Abnormal; Notable for the following:    Neutro Abs 8.8 (*)    All other components within normal limits  COMPREHENSIVE METABOLIC PANEL - Abnormal; Notable for the following:    Sodium 147 (*)      Glucose, Bld 126 (*)    BUN 36 (*)    Calcium 10.7 (*)    ALT 10 (*)    GFR calc non Af Amer 55 (*)    All other components within normal limits  I-STAT CHEM 8, ED - Abnormal; Notable for the following:    Sodium 146 (*)    BUN 37 (*)    Glucose, Bld 124 (*)    All other components within normal limits  ETHANOL  PROTIME-INR  APTT  RAPID URINE DRUG SCREEN, HOSP PERFORMED  URINALYSIS, ROUTINE W REFLEX MICROSCOPIC  I-STAT TROPOININ, ED    EKG  EKG Interpretation None       Radiology Ct Head Code Stroke W/o Cm  Result Date: 10/21/2016 CLINICAL DATA:  Code stroke. LEFT-sided weakness, slurred speech. History of stroke 1 week ago. EXAM: CT HEAD WITHOUT CONTRAST TECHNIQUE: Contiguous axial images were obtained from the base of the skull through the vertex without intravenous contrast. COMPARISON:  MRI of the brain October 16, 2016 and CT HEAD October 15, 2016 FINDINGS: BRAIN: No intraparenchymal hemorrhage, mass effect nor midline shift. New RIGHT thalamus lacunar infarct. Bilateral basal ganglia lacunar infarcts and patchy LEFT thalamus hypodensity are similar. Old small RIGHT cerebellar infarct. Moderate ventriculomegaly, mild ex vacuo dilatation LEFT frontal horn of lateral ventricle. No hydrocephalus. No acute large vascular territory infarct. Confluent supratentorial and patchy pontine white matter hypodensities. No abnormal extra-axial fluid collections. VASCULAR: Moderate to severe calcific atherosclerosis of the carotid siphons and included vertebral arteries. SKULL: No skull fracture. No significant scalp soft tissue swelling. SINUSES/ORBITS: The mastoid air-cells and included paranasal sinuses are well-aerated.The included ocular globes and orbital contents are non-suspicious. OTHER: None. ASPECTS [facility] Stroke Program Early CT Score) - Ganglionic level infarction (caudate, lentiform nuclei, internal capsule, insula, M1-M3 cortex): 7 - Supraganglionic infarction (M4-M6 cortex): 3 Total  score (0-10 with 10 being normal): 10 IMPRESSION: 1. Acute RIGHT thalamus lacunar infarct. 2. Old basal ganglia lacunar infarcts. Old RIGHT cerebellar infarct. 3. Severe chronic small vessel ischemic disease. 4. ASPECTS is 10.  5. Critical Value/emergent results were called by telephone at the time of interpretation on 10/21/2016 at 2:39 pm to Dr. Leonel Ramsay Neurology, who verbally acknowledged these results. Electronically Signed   By: Elon Alas M.D.   On: 10/21/2016 14:41    Procedures Procedures (including critical care time)  Medications Ordered in ED Medications - No data to display   Initial Impression / Assessment and Plan / ED Course  I have reviewed the triage vital signs and the nursing notes.  Pertinent labs & imaging results that were available during my care of the patient were reviewed by me and considered in my medical decision making (see chart for details).     Patient with worsening neurologic deficits. Likely worsening of her previous stroke. Not a TPA candidate. Seen by neurology. Will admit to internal medicine. Patient has a most form that says both comfort care with no medicines for intervention but also says wants antibiotics.  Final Clinical Impressions(s) / ED Diagnoses   Final diagnoses:  Cerebral infarction, unspecified mechanism [facility])    New Prescriptions New Prescriptions   No medications on file     Davonna Belling, MD 10/21/16 1559

## 2016-10-21 NOTE — ED Notes (Signed)
Patient failed Swallow Eval. Speech is unintelligible.

## 2016-10-21 NOTE — Progress Notes (Signed)
Pt admitted from ED with stroke diagnosis, pt lethargic, eyes open but with severe aphasia, pt bathed and settled in bed, Dr Marlowe Sax (admit doc) paged to clarify orders, pt is a DNR, no telemetry, frequent v/s and neuro checks per protocol ordered at this time, was however reassured and will continue to monitor. Ob[place]-Asidi, Mory Herrman Efe

## 2016-10-21 NOTE — H&P (Signed)
Date: 10/21/2016               Patient Name:  Rebecca Knight MRN: [id number]  DOB: [birth year] Age / Sex: 83 y.o., female   PCP: [name], DO         Medical Service: Internal Medicine Teaching Service         Attending Physician: Dr. Oval Linsey, MD    First Contact: Dr. Tarri Abernethy Pager: [phone]  Second Contact: Dr. Marlowe Sax Pager: [phone]       After Hours (After 5p/  First Contact Pager: [phone]  weekends / holidays): Second Contact Pager: [phone]   Chief Complaint: Altered Mental Status   History of Present Illness: Rebecca Knight is a 83 y.o. Female with a PMHx significant for CVA and recent discharge for Rebecca Knight on 10/17/2016. The patient was unable to communicate with the Internal Medicine admission team. Therefore, history and baseline information was gathered through chart review and discussion with Rebecca Knight's son (Rebecca Knight-emergency contact).   Per Son Rebecca Knight): After discharge on 10/17/2016 she was able to use the left side of her body minimally but more than current. She was also able to communicate and eat nectar thick fluids. We thoroughly discussed the next appropriate step. He filled out a MOST form and DNR. Stating he does not want aggressive therapy aside from antibiotics and IV fluids. No feeding tube. He is okay with admitting her for observation and having speech evaluate her. Does not want to pursue further stroke work-up. Requesting "comfort care."  Per ED Note: Last know normal around 11 AM. It was noticed that she would not use her left side at all. Normally she is able to use her left side minimally. Upon arrival could not use left side of body, facial droop, and slurred speech.   Meds:  Current Meds  Medication Sig   amLODipine (NORVASC) 2.5 MG tablet Take 1 tablet (2.5 mg total) by mouth daily.   aspirin EC 81 MG tablet Take 81 mg by mouth daily.   clopidogrel (PLAVIX) 75 MG tablet Take 1 tablet (75 mg total) by mouth daily.   gabapentin (NEURONTIN) 100 MG  capsule Take 100 mg by mouth 2 (two) times daily. 100mg  twice daily, 300mg  at bedtime   gabapentin (NEURONTIN) 300 MG capsule Take 300 mg by mouth at bedtime.   losartan (COZAAR) 100 MG tablet Take 1 tablet by mouth daily   metoprolol succinate (TOPROL-XL) 25 MG 24 hr tablet Take 1 tablet (25 mg total) by mouth daily.   mirtazapine (REMERON SOL-TAB) 15 MG disintegrating tablet Take 7.5 mg by mouth at bedtime.   Multiple Vitamins-Minerals (MULTIVITAMINS THER. W/MINERALS) TABS tablet Take 1 tablet by mouth daily. Thera-M Caplet   Polyethyl Glycol-Propyl Glycol (SYSTANE) 0.4-0.3 % SOLN Place 1 drop into both eyes 4 (four) times daily. Reported on 05/13/2015   vitamin C (ASCORBIC ACID) 500 MG tablet Take 500 mg by mouth 2 (two) times daily.   Allergies: Allergies as of 10/21/2016 - Review Complete 10/21/2016  Allergen Reaction Noted   Penicillins Rash 11/20/2006   Past Medical History:  Diagnosis Date   AAA (abdominal aortic aneurysm) ([place])    [name]   Anxiety    Arthritis    Calculus of gallbladder without mention of cholecystitis or obstruction    Cancer [facility])    Colon and Rectal   CVA (cerebral vascular accident) ([place]) 09/2016   Diverticulosis of colon    Heart murmur    Herpes zoster of eye  2002   Dr. Herbert Deaner   History of colon cancer 1995   Dr Lennie Hummer   Hyperlipidemia    Hypertension    Osteoporosis    Post herpetic neuralgia    Varicose veins    Family History: Noncontributory   Social History:  Lives at [facility] on [address]  Review of Systems: Unable to obtain due to lack of ability to communicate.   Physical Exam: Blood pressure (!) 179/120, pulse (!) 103, temperature 97.8 F (36.6 C), temperature source Axillary, resp. rate 20, SpO2 96 %.  Physical Exam  HENT:  Head: Normocephalic and atraumatic.  Eyes: Pupils are equal, round, and reactive to light.  Neck: Normal range of motion. Neck supple.  Cardiovascular: Normal rate  and regular rhythm.  Exam reveals no gallop and no friction rub.   Murmur (AS) heard. Pulmonary/Chest: No respiratory distress. She has no wheezes.  Abdominal: Soft. Bowel sounds are normal. She exhibits no distension. There is no tenderness. There is no rebound.  Neurological: She is alert.  Cranial nerves appear intact with exception of cranial nerve VII and XII (Left sided facial droop and tongue protrudes to the left).Unable to move the left side of her body, including arm and leg. Pain sensation intact.     EKG: personally reviewed my interpretation is unremarkable.   Assessment & Plan by Problem: Principal Problem:   CVA (cerebral vascular accident) [facility])  [name] is a 83 y.o. Female with a PMHx of CVA previously discharged on 10/17/2016 on Plavix and ASA presenting today with worsening left sided weakness and dysarthria.   CVA. Recently discharged on 10/17/2016 on ASA and Plavix. Presented with worsening left sided weakness. Neurology consulted discussed option of pursuing aggressive work-up including CT angio head and neck, continue antiplatelet therapy, and pursue PT and OT.  - Discussion with son Rebecca Knight) and reviewed Most form. No aggressive measures, no feeding tubes, no further imaging. Focus quality of life.  - Speech evaluation - Per son request, continue antiplatelet therapy if pt is able to swallow, pending swallow study - Comfort care, vitals per unit standards, no telemetry, neuro assessments q4hr  Hypertension. Allow permissive HTN.  - Gentle hydration with IV Fluids - PRN IV Labetalol if SBP > 220 or DBP > 120  Dispo: Admit patient to Observation with expected length of stay less than 2 midnights.  Signed: Ina Homes, MD 10/21/2016, 6:39 PM  Pager: My Pager: [phone]

## 2016-10-22 DIAGNOSIS — E871 Hypo-osmolality and hyponatremia: Secondary | ICD-10-CM | POA: Diagnosis present

## 2016-10-22 DIAGNOSIS — Z515 Encounter for palliative care: Secondary | ICD-10-CM | POA: Diagnosis present

## 2016-10-22 DIAGNOSIS — I63211 Cerebral infarction due to unspecified occlusion or stenosis of right vertebral arteries: Secondary | ICD-10-CM

## 2016-10-22 DIAGNOSIS — I6322 Cerebral infarction due to unspecified occlusion or stenosis of basilar arteries: Secondary | ICD-10-CM | POA: Diagnosis not present

## 2016-10-22 DIAGNOSIS — K5730 Diverticulosis of large intestine without perforation or abscess without bleeding: Secondary | ICD-10-CM | POA: Diagnosis present

## 2016-10-22 DIAGNOSIS — Z880 Allergy status to penicillin: Secondary | ICD-10-CM | POA: Diagnosis not present

## 2016-10-22 DIAGNOSIS — M810 Age-related osteoporosis without current pathological fracture: Secondary | ICD-10-CM | POA: Diagnosis present

## 2016-10-22 DIAGNOSIS — Z85038 Personal history of other malignant neoplasm of large intestine: Secondary | ICD-10-CM | POA: Diagnosis not present

## 2016-10-22 DIAGNOSIS — Z8249 Family history of ischemic heart disease and other diseases of the circulatory system: Secondary | ICD-10-CM | POA: Diagnosis not present

## 2016-10-22 DIAGNOSIS — Z823 Family history of stroke: Secondary | ICD-10-CM | POA: Diagnosis not present

## 2016-10-22 DIAGNOSIS — G8104 Flaccid hemiplegia affecting left nondominant side: Secondary | ICD-10-CM | POA: Diagnosis present

## 2016-10-22 DIAGNOSIS — I6381 Other cerebral infarction due to occlusion or stenosis of small artery: Secondary | ICD-10-CM

## 2016-10-22 DIAGNOSIS — Z808 Family history of malignant neoplasm of other organs or systems: Secondary | ICD-10-CM | POA: Diagnosis not present

## 2016-10-22 DIAGNOSIS — R471 Dysarthria and anarthria: Secondary | ICD-10-CM | POA: Diagnosis present

## 2016-10-22 DIAGNOSIS — Z85048 Personal history of other malignant neoplasm of rectum, rectosigmoid junction, and anus: Secondary | ICD-10-CM | POA: Diagnosis not present

## 2016-10-22 DIAGNOSIS — R1312 Dysphagia, oropharyngeal phase: Secondary | ICD-10-CM | POA: Diagnosis not present

## 2016-10-22 DIAGNOSIS — Z7902 Long term (current) use of antithrombotics/antiplatelets: Secondary | ICD-10-CM | POA: Diagnosis not present

## 2016-10-22 DIAGNOSIS — I639 Cerebral infarction, unspecified: Secondary | ICD-10-CM | POA: Diagnosis present

## 2016-10-22 DIAGNOSIS — Z8673 Personal history of transient ischemic attack (TIA), and cerebral infarction without residual deficits: Secondary | ICD-10-CM | POA: Diagnosis not present

## 2016-10-22 DIAGNOSIS — I10 Essential (primary) hypertension: Secondary | ICD-10-CM

## 2016-10-22 DIAGNOSIS — I63231 Cerebral infarction due to unspecified occlusion or stenosis of right carotid arteries: Secondary | ICD-10-CM

## 2016-10-22 DIAGNOSIS — I714 Abdominal aortic aneurysm, without rupture: Secondary | ICD-10-CM | POA: Diagnosis present

## 2016-10-22 DIAGNOSIS — Z833 Family history of diabetes mellitus: Secondary | ICD-10-CM | POA: Diagnosis not present

## 2016-10-22 DIAGNOSIS — M1990 Unspecified osteoarthritis, unspecified site: Secondary | ICD-10-CM | POA: Diagnosis present

## 2016-10-22 DIAGNOSIS — R4182 Altered mental status, unspecified: Secondary | ICD-10-CM | POA: Diagnosis present

## 2016-10-22 DIAGNOSIS — Z7982 Long term (current) use of aspirin: Secondary | ICD-10-CM | POA: Diagnosis not present

## 2016-10-22 DIAGNOSIS — Z96641 Presence of right artificial hip joint: Secondary | ICD-10-CM | POA: Diagnosis present

## 2016-10-22 DIAGNOSIS — E785 Hyperlipidemia, unspecified: Secondary | ICD-10-CM

## 2016-10-22 DIAGNOSIS — R29810 Facial weakness: Secondary | ICD-10-CM | POA: Diagnosis present

## 2016-10-22 DIAGNOSIS — B0229 Other postherpetic nervous system involvement: Secondary | ICD-10-CM | POA: Diagnosis present

## 2016-10-22 DIAGNOSIS — Z66 Do not resuscitate: Secondary | ICD-10-CM | POA: Diagnosis present

## 2016-10-22 LAB — URINALYSIS, ROUTINE W REFLEX MICROSCOPIC
Bilirubin Urine: NEGATIVE
Glucose, UA: 50 mg/dL — AB
Hgb urine dipstick: NEGATIVE
KETONES UR: 5 mg/dL — AB
Nitrite: NEGATIVE
PROTEIN: NEGATIVE mg/dL
Specific Gravity, Urine: 1.017 (ref 1.005–1.030)
pH: 6 (ref 5.0–8.0)

## 2016-10-22 LAB — CBC
HEMATOCRIT: 42.2 % (ref 36.0–46.0)
Hemoglobin: 12.9 g/dL (ref 12.0–15.0)
MCH: 25.6 pg — ABNORMAL LOW (ref 26.0–34.0)
MCHC: 30.6 g/dL (ref 30.0–36.0)
MCV: 83.9 fL (ref 78.0–100.0)
PLATELETS: 196 10*3/uL (ref 150–400)
RBC: 5.03 MIL/uL (ref 3.87–5.11)
RDW: 16.6 % — ABNORMAL HIGH (ref 11.5–15.5)
WBC: 11.3 10*3/uL — AB (ref 4.0–10.5)

## 2016-10-22 MED ORDER — ASPIRIN 300 MG RE SUPP
300.0000 mg | Freq: Every day | RECTAL | Status: DC
  Administered 2016-10-22 – 2016-10-24 (×3): 300 mg via RECTAL
  Filled 2016-10-22 (×3): qty 1

## 2016-10-22 MED ORDER — SODIUM CHLORIDE 0.9 % IV SOLN
INTRAVENOUS | Status: DC
  Administered 2016-10-22: 10:00:00 via INTRAVENOUS

## 2016-10-22 MED ORDER — PRAVASTATIN SODIUM 20 MG PO TABS
20.0000 mg | ORAL_TABLET | Freq: Every day | ORAL | Status: DC
  Administered 2016-10-23: 20 mg via ORAL
  Filled 2016-10-22: qty 1

## 2016-10-22 MED ORDER — SODIUM CHLORIDE 0.45 % IV SOLN
INTRAVENOUS | Status: DC
  Administered 2016-10-22 – 2016-10-24 (×5): via INTRAVENOUS

## 2016-10-22 MED ORDER — STERILE WATER FOR INJECTION IV SOLN: INTRAVENOUS | Status: DC

## 2016-10-22 MED ORDER — CHLORHEXIDINE GLUCONATE 0.12 % MT SOLN
15.0000 mL | Freq: Two times a day (BID) | OROMUCOSAL | Status: DC
  Administered 2016-10-22 – 2016-10-24 (×4): 15 mL via OROMUCOSAL
  Filled 2016-10-22 (×3): qty 15

## 2016-10-22 MED ORDER — ASPIRIN EC 325 MG PO TBEC: 325.0000 mg | DELAYED_RELEASE_TABLET | Freq: Every day | ORAL | Status: DC

## 2016-10-22 MED ORDER — ORAL CARE MOUTH RINSE
15.0000 mL | Freq: Two times a day (BID) | OROMUCOSAL | Status: DC
  Administered 2016-10-22 – 2016-10-23 (×2): 15 mL via OROMUCOSAL

## 2016-10-22 MED ORDER — BLISTEX MEDICATED EX OINT
TOPICAL_OINTMENT | CUTANEOUS | Status: DC | PRN
  Filled 2016-10-22 (×2): qty 6.3

## 2016-10-22 NOTE — Evaluation (Addendum)
Clinical/Bedside Swallow Evaluation Patient Details  Name: Rebecca Knight MRN: [id number] Date of Birth: 03-23-1923  Today's Date: 10/22/2016 Time: SLP Start Time (ACUTE ONLY): 7106 SLP Stop Time (ACUTE ONLY): 0936 SLP Time Calculation (min) (ACUTE ONLY): 10 min  Past Medical History:  Past Medical History:  Diagnosis Date  . AAA (abdominal aortic aneurysm) (HCC)    [name]  . Anxiety   . Arthritis   . Calculus of gallbladder without mention of cholecystitis or obstruction   . Cancer [facility])    Colon and Rectal  . CVA (cerebral vascular accident) ([place]) 09/2016  . Diverticulosis of colon   . Heart murmur   . Herpes zoster of eye 2002   Dr. Herbert Deaner  . History of colon cancer 1995   Dr Lennie Hummer  . Hyperlipidemia   . Hypertension   . Osteoporosis   . Post herpetic neuralgia   . Varicose veins    Past Surgical History:  Past Surgical History:  Procedure Laterality Date  . COLON SURGERY     RESECTION in 1990s  . ENDOVASCULAR STENT INSERTION  08/08/2011   Procedure: ENDOVASCULAR STENT GRAFT INSERTION;  Surgeon: [name], MD;  Location: [facility] OR;  Service: Vascular;  Laterality: N/A;  Endo Vascular Aortic stent placement with gore  . EYE SURGERY     CAT EXT  OU  . HIP ARTHROPLASTY Right 04/19/2016   Procedure: ARTHROPLASTY BIPOLAR HIP (HEMIARTHROPLASTY);  Surgeon: [name], MD;  Location: [place];  Service: Orthopedics;  Laterality: Right;  . HIP ARTHROPLASTY Right 04/20/2016   Procedure: REVISION ARTHROPLASTY BIPOLAR HIP (HEMIARTHROPLASTY);  Surgeon: [name], MD;  Location: [place];  Service: Orthopedics;  Laterality: Right;  . HIP CLOSED REDUCTION Right 04/19/2016   Procedure: CLOSED MANIPULATION HIP;  Surgeon: [name], MD;  Location: [place];  Service: Orthopedics;  Laterality: Right;  . TONSILLECTOMY     HPI:  Pt is a 83 y.o. female who was recently admitted for acute CVA before d/c to SNF on 6/28. She returned on 7/2 due to worsening L sided weakness and  dysarthria. CT showed an acute R thalamus lacunar infarct. Pt had MBS during recent admission that revealed flash penetration of thin and nectar thick liquids, recommending Dys 1 diet and thin liquids. PMH includes dementia, HTN, CVA, anxiety, dysphagia   Assessment / Plan / Recommendation Clinical Impression  Pt's dysphagia appears to be worsened from recent admission, due to increased L sided weakness, weak cough response, and more impaired mentation. She has an incomplete labial seal regardless of bolus presentation, although with spoon being the most effective delivery method to increase oral acceptance. She has prolonged oral transit and a suspected delay in swallow initiation, with spoonfuls of thin and nectar thick liquids resulting in coughing. Pureed solids were anteriorly held in her oral cavity until SLP manually removed them. Given the above, it does not appear that a safe diet can be recommended at bedside. Should family opt for comfort feeding, they could consider up to Dys 1 diet (her baseline diet) to be offered only when she is initiating oral manipulation (suction should be available). For liquids, they could consider spoonfuls or swabs of thin liquid since she appears to be at a high risk for aspiration with any consistency. Will defer diet initiation until [facility] can be further clarified with the medical team. SLP Visit Diagnosis: Dysphagia, oropharyngeal phase (R13.12)    Aspiration Risk  Moderate aspiration risk;Severe aspiration risk;Risk for inadequate nutrition/hydration  Diet Recommendation Other (Comment) (may consider Dys 1 diet if wanting comfort care)   Liquid Administration via: Spoon Medication Administration: Via alternative means    Other  Recommendations Oral Care Recommendations: Oral care QID Other Recommendations: Have oral suction available   Follow up Recommendations  (tba)      Frequency and Duration min 2x/week  2 weeks       Prognosis Prognosis for  Safe Diet Advancement: Fair Barriers to Reach Goals: Cognitive deficits;Severity of deficits      Swallow Study   General HPI: Pt is a 83 y.o. female who was recently admitted for acute CVA before d/c to SNF on 6/28. She returned on 7/2 due to worsening L sided weakness and dysarthria. CT showed an acute R thalamus lacunar infarct. Pt had MBS during recent admission that revealed flash penetration of thin and nectar thick liquids, recommending Dys 1 diet and thin liquids. PMH includes dementia, HTN, CVA, anxiety, dysphagia Type of Study: Bedside Swallow Evaluation Previous Swallow Assessment: see HPI Diet Prior to this Study: NPO Temperature Spikes Noted: No Respiratory Status: Room air History of Recent Intubation: No Behavior/Cognition: Alert;Cooperative;Pleasant mood;Requires cueing Oral Cavity Assessment: Dry Oral Cavity - Dentition: Adequate natural dentition Self-Feeding Abilities: Total assist Patient Positioning: Upright in bed Baseline Vocal Quality: Low vocal intensity Volitional Cough: Weak Volitional Swallow: Able to elicit    Oral/Motor/Sensory Function Overall Oral Motor/Sensory Function: Moderate impairment Facial ROM: Reduced left;Suspected CN VII (facial) dysfunction Facial Symmetry: Abnormal symmetry left;Suspected CN VII (facial) dysfunction Facial Strength: Reduced left;Suspected CN VII (facial) dysfunction   Ice Chips Ice chips: Impaired Presentation: Spoon Oral Phase Impairments: Reduced labial seal Oral Phase Functional Implications: Prolonged oral transit Pharyngeal Phase Impairments: Suspected delayed Swallow   Thin Liquid Thin Liquid: Impaired Presentation: Spoon Oral Phase Impairments: Reduced labial seal Oral Phase Functional Implications: Prolonged oral transit;Left anterior spillage Pharyngeal  Phase Impairments: Cough - Immediate    Nectar Thick Nectar Thick Liquid: Impaired Presentation: Spoon Oral Phase Impairments: Reduced labial seal Oral  phase functional implications: Left anterior spillage;Prolonged oral transit Pharyngeal Phase Impairments: Cough - Delayed   Honey Thick Honey Thick Liquid: Not tested   Puree Puree: Impaired Presentation: Spoon Oral Phase Impairments: Reduced labial seal;Poor awareness of bolus Oral Phase Functional Implications: Oral holding;Other (comment) (SLP removed)   Solid   GO     Solid: Not tested        Functional Assessment Tool Used: skilled clinical judgment Functional Limitations: Swallowing Swallow Current Status ([id number]): At least 80 percent but less than 100 percent impaired, limited or restricted Swallow Goal Status [phone]): At least 60 percent but less than 80 percent impaired, limited or restricted   Germain Osgood 10/22/2016,11:43 AM  Germain Osgood, M.A. CCC-SLP [phone]

## 2016-10-22 NOTE — H&P (Signed)
Internal Medicine Attending Admission Note Date: 10/22/2016  Patient name: Rebecca Knight Medical record number: [id number] Date of birth: Sep 07, 1922 Age: 83 y.o. Gender: female  I saw and evaluated the patient. I reviewed the resident's note and I agree with the resident's findings and plan as documented in the resident's note.  Chief Complaint(s): Flaccid paralysis of the left side of her body.  History - key components related to admission:  Rebecca Knight is a 83 year old woman with a history of hypertension, hyperlipidemia, abdominal aortic aneurysm, and recent right thalamus CVA who was discharged from the internal medicine teaching service on 10/17/2016 after assessment for an acute right thalamic stroke. She was doing well at the skilled nursing facility until the morning of admission when she was noted to have acute flaccid paralysis of the left hemibody with dysarthric speech. She was transported to the emergency department and underwent a head CT which demonstrated the right thalamic stroke. She was seen by neurology who felt this was an extension of the recent stroke. She was comfort measures only and the decision was made with the physicians and the patient's son not to progress to an invasive evaluation or interventions. She was admitted to the internal medicine teaching service for further clarification of goals of care and fine tuning of basic management to assure comfort.  Physical Exam - key components related to admission:  Vitals:   10/22/16 0534 10/22/16 0600 10/22/16 0938 10/22/16 1331  BP: (!) 168/98  139/81 (!) 158/92  Pulse: 82  (!) 102 98  Resp: 20  16 18   Temp: 97.8 F (36.6 C)  98 F (36.7 C) 98.7 F (37.1 C)  TempSrc: Oral  Oral Oral  SpO2: 99%  98% 99%  Weight:  104 lb 1.6 oz (47.2 kg)    Height:  5\' 1"  (1.549 m)     Gen.: Well-developed, well-nourished, woman lying comfortably in bed in no acute distress. Neuro: Her speech is dysarthric and soft. She is able to  follow commands without difficulty. Cranial nerve II through XII were intact. Strength was 0/5 in the left upper extremity and left lower extremity. Strength was intact on the right side. She did not have left sided neglect.  Lab results:  Basic Metabolic Panel:  Recent Labs  10/21/16 1420 10/21/16 1427  NA 147* 146*  K 3.8 3.8  CL 106 105  CO2 30  --   GLUCOSE 126* 124*  BUN 36* 37*  CREATININE 0.88 0.90  CALCIUM 10.7*  --    Liver Function Tests:  Recent Labs  10/21/16 1420  AST 22  ALT 10*  ALKPHOS 58  BILITOT 1.0  PROT 6.8  ALBUMIN 3.9   CBC:  Recent Labs  10/21/16 1420 10/21/16 1427 10/22/16 0315  WBC 11.9*  --  11.3*  NEUTROABS 8.8*  --   --   HGB 13.2 13.9 12.9  HCT 43.2 41.0 42.2  MCV 83.9  --  83.9  PLT 203  --  196   Coagulation:  Recent Labs  10/21/16 1420  INR 1.14   Alcohol Level:  Recent Labs  10/21/16 1415  ETH <5   Urinalysis:  Clear, yellow, specific gravity 1.017, pH 6.0, glucose 50, ketones 5, nitrite negative, leukocytes moderate, 0-5 red blood cells per high-power field, 6-30 white blood cells per high-power field.  Imaging results:  Ct Head Code Stroke W/o Cm  Result Date: 10/21/2016 CLINICAL DATA:  Code stroke. LEFT-sided weakness, slurred speech. History of stroke 1 week ago. EXAM:  CT HEAD WITHOUT CONTRAST TECHNIQUE: Contiguous axial images were obtained from the base of the skull through the vertex without intravenous contrast. COMPARISON:  MRI of the brain October 16, 2016 and CT HEAD October 15, 2016 FINDINGS: BRAIN: No intraparenchymal hemorrhage, mass effect nor midline shift. New RIGHT thalamus lacunar infarct. Bilateral basal ganglia lacunar infarcts and patchy LEFT thalamus hypodensity are similar. Old small RIGHT cerebellar infarct. Moderate ventriculomegaly, mild ex vacuo dilatation LEFT frontal horn of lateral ventricle. No hydrocephalus. No acute large vascular territory infarct. Confluent supratentorial and patchy pontine  white matter hypodensities. No abnormal extra-axial fluid collections. VASCULAR: Moderate to severe calcific atherosclerosis of the carotid siphons and included vertebral arteries. SKULL: No skull fracture. No significant scalp soft tissue swelling. SINUSES/ORBITS: The mastoid air-cells and included paranasal sinuses are well-aerated.The included ocular globes and orbital contents are non-suspicious. OTHER: None. ASPECTS [facility] Stroke Program Early CT Score) - Ganglionic level infarction (caudate, lentiform nuclei, internal capsule, insula, M1-M3 cortex): 7 - Supraganglionic infarction (M4-M6 cortex): 3 Total score (0-10 with 10 being normal): 10 IMPRESSION: 1. Acute RIGHT thalamus lacunar infarct. 2. Old basal ganglia lacunar infarcts. Old RIGHT cerebellar infarct. 3. Severe chronic small vessel ischemic disease. 4. ASPECTS is 10. 5. Critical Value/emergent results were called by telephone at the time of interpretation on 10/21/2016 at 2:39 pm to Dr. Leonel Ramsay Neurology, who verbally acknowledged these results. Electronically Signed   By: Elon Alas M.D.   On: 10/21/2016 14:41  The above study was not personally reviewed, but the radiologist's interpretation was noted.  Other results:  EKG: Personally reviewed. Normal sinus rhythm at 100 bpm, left axis deviation, left anterior hemiblock, normal intervals, no significant Q waves, no LVH by voltage, good R wave progression, no ST segment changes, inferolateral T wave flattening. No change from the previous ECG on 10/15/2016.  Assessment & Plan by Problem:  [name] is a 83 year old woman with a history of hypertension, hyperlipidemia, abdominal aortic aneurysm, and recent right thalamus CVA who was discharged from the internal medicine teaching service on 10/17/2016 after assessment for an acute right thalamic stroke. She developed acute worsening of her weakness on the left side and the concern is she had extension of her acute right thalamic  stroke. Examination today reveals continued flaccid paralysis and a soft voice with some dysarthria and a failed swallowing examination. This puts her at considerable risk for aspiration moving forward and prognostically bodes very poorly given the CVA extension and her age. At this point she is comfort care only and we are further refining her goals of care with her son.  1) Extension of her right thalamic CVA now with left-sided flaccid paralysis, dysarthria, and swallowing difficulties: We will continue with the aspirin suppository, provide her with sponges to keep her mouth moist, address any comfort issues that we can, and continue to discuss goals of care with the focus at this point being comfort. She has a free water deficit on admission of 870 mls and we will try to replete some of this free water deficit with quarter normal saline avoiding any dextrose in the acute CVA setting. We are working closely with her son Soley Harriss as he processes the downturn in his mother's health status.  2) Disposition: If she remains stable overnight and we help Mr. Underberg through the acute grieving process I anticipate she will be ready for transfer back to the skilled nursing facility with clear instructions on how to proceed moving forward.

## 2016-10-22 NOTE — Progress Notes (Signed)
Paged by RN for BP 173/114, requesting prn medication. Patient with recent CVA. Evaluated by neruo this morning and recommended continuing permissive HTN <220/120. Patient is also comfort care. Will continue to allow permissive HTN.

## 2016-10-22 NOTE — Progress Notes (Signed)
Internal Medicine Attending  Date: 10/22/2016  Patient name: Rebecca Knight Medical record number: [id number] Date of birth: February 04, 1923 Age: 83 y.o. Gender: female  I saw and evaluated the patient. I reviewed the resident's note by Dr. Tarri Abernethy and I agree with the resident's findings and plans as documented in his progress note.  Please see my H&P dated 10/22/2016 for the specifics my evaluation, assessment, and plan from earlier today.

## 2016-10-22 NOTE — Progress Notes (Signed)
STROKE TEAM PROGRESS NOTE   HISTORY OF PRESENT ILLNESS (per record) Rebecca Knight a 83 y.o.femalewith a hx of AAA, hypertension, hyperlipidemia, heart murmur, rectal and colon cancer (in remission), and prior strokes, who presented from her SNF on 10/21/2016 with left hemiplegia, time of onset between 11:00am and 12:30pm.  While the patient was eating she noticed that she could not move her left side at all.  She was admitted 10/15/2016 for subcortical infarct on the right causing mild left hemiparesis. She did well and was discharged with 3/5 strength.  At baseline cannot she walk due to R hip fracture.     LKN: 1100 on 10/21/2016.  CT Head with acute right thalamic lacunar infarct, old basal ganglia lacunar infarcts, and old right cerebellar infarct.  Patient was not administered IV t-PA secondary to recent infarct.  She was admitted to General Neurology for further evaluation and treatment.   SUBJECTIVE (INTERVAL HISTORY) She is awake but lethargic and severely dysarthric.  She follows commands inconsistently.  She appears to be neglecting her left side, and is inconsistently responsive to left visual field confrontation.   OBJECTIVE Temp:  [97.4 F (36.3 C)-98.2 F (36.8 C)] 98 F (36.7 C) (07/03 0938) Pulse Rate:  [81-104] 102 (07/03 0938) Resp:  [16-30] 16 (07/03 0938) BP: (139-198)/(81-121) 139/81 (07/03 0938) SpO2:  [95 %-99 %] 98 % (07/03 0938) Weight:  [43.6 kg (96 lb 1.6 oz)-47.2 kg (104 lb 1.6 oz)] 47.2 kg (104 lb 1.6 oz) (07/03 0600)  CBC:  Recent Labs Lab 10/15/16 1159  10/21/16 1420 10/21/16 1427 10/22/16 0315  WBC 9.3  --  11.9*  --  11.3*  NEUTROABS 6.8  --  8.8*  --   --   HGB 12.5  < > 13.2 13.9 12.9  HCT 41.0  < > 43.2 41.0 42.2  MCV 82.3  --  83.9  --  83.9  PLT 206  --  203  --  196  < > = values in this interval not displayed.  Basic Metabolic Panel:  Recent Labs Lab 10/17/16 0502 10/21/16 1420 10/21/16 1427  NA 141 147* 146*  K 3.1* 3.8 3.8   CL 103 106 105  CO2 28 30  --   GLUCOSE 112* 126* 124*  BUN 15 36* 37*  CREATININE 0.70 0.88 0.90  CALCIUM 10.3 10.7*  --     Lipid Panel:    Component Value Date/Time   CHOL 141 10/16/2016 0618   TRIG 116 10/16/2016 0618   HDL 45 10/16/2016 0618   CHOLHDL 3.1 10/16/2016 0618   VLDL 23 10/16/2016 0618   LDLCALC 73 10/16/2016 0618   HgbA1c:  Lab Results  Component Value Date   HGBA1C 5.3 10/16/2016   Urine Drug Screen:    Component Value Date/Time   LABOPIA NONE DETECTED 12/23/2014 1251   COCAINSCRNUR NONE DETECTED 12/23/2014 1251   LABBENZ NONE DETECTED 12/23/2014 1251   AMPHETMU NONE DETECTED 12/23/2014 1251   THCU NONE DETECTED 12/23/2014 1251   LABBARB NONE DETECTED 12/23/2014 1251    Alcohol Level     Component Value Date/Time   ETH <5 10/21/2016 1415    IMAGING I have personally reviewed the radiological images below and agree with the radiology interpretations.  Ct Head Code Stroke W/o Cm 10/21/2016 IMPRESSION: 1. Acute RIGHT thalamus lacunar infarct. 2. Old basal ganglia lacunar infarcts. Old RIGHT cerebellar infarct. 3. Severe chronic small vessel ischemic disease. 4. ASPECTS is 10.   Ct Angio Head and neck W  and Wo Contrast 10/15/2016 IMPRESSION: 1. Negative for emergent large vessel occlusion. 2. Positive for moderate to severe distal right ICA stenosis, due to calcified plaque at the distal supraclinoid segment. The right ICA terminus and right anterior circulation remain patent. 3. Mild to moderate right PCA atherosclerotic stenosis. 4. Extracranial arteries are tortuous, but with minimal atherosclerosis and there is no significant stenosis in the neck. 5. Ectatic aortic arch and proximal descending thoracic aorta with mild to moderate calcified atherosclerosis.  Mr Brain Wo Contrast 10/16/2016 IMPRESSION: Acute infarction in the right external capsule/ radiating white matter tracts. Extensive chronic small vessel ischemic changes elsewhere throughout the  brain as outlined above.    PHYSICAL EXAM  Temp:  [97.4 F (36.3 C)-98.7 F (37.1 C)] 98.7 F (37.1 C) (07/03 1331) Pulse Rate:  [81-104] 98 (07/03 1331) Resp:  [16-30] 18 (07/03 1331) BP: (139-198)/(81-121) 158/92 (07/03 1331) SpO2:  [95 %-99 %] 99 % (07/03 1331) Weight:  [96 lb 1.6 oz (43.6 kg)-104 lb 1.6 oz (47.2 kg)] 104 lb 1.6 oz (47.2 kg) (07/03 0600)  General - Well nourished, well developed, lethargic and severely dysarthric.  Ophthalmologic - Fundi not visualized due to noncooperation.  Cardiovascular - Regular rate and rhythm.  Neuro - lethargic, severely dysarthric, able to most follow simple commands, however, psychomotor slowing. Possibility of speech, able to name and repeat, however with great difficulty. PERRL, EOMI, blinking to visual threat on the right side, however inconsistently on the left, concerning for left neglect versus hemianopsia. Left mild facial asymmetry, tongue protrusion towards left. Left hemiplegia, 0/5 on pain stimulation. Right upper and lower extremity spontaneous movement, at least 4/5. DTR 1+, Babinski negative. Sensation, coordination and gait not tested.   ASSESSMENT/PLAN [name] is a 83 y.o. female with history of AAA, hypertension, hyperlipidemia, heart murmur, rectal and colon cancer (in remission), and prior strokes presenting with left hemiplegia, time of onset between 11:00am and 12:30pm. She did not receive IV t-PA due to recent infarct.   Stroke: Acute RIGHT thalamus lacunar infarct in the setting of recent right BG/CR infarcts and extensive chronic small vessel ischemic changes.  Resultant  left hemiplegia, severe dysarthria  CT head:  1. Acute RIGHT thalamus lacunar infarct. 2. Old basal ganglia lacunar infarcts. Old RIGHT cerebellar infarct. 3. Severe chronic small vessel ischemic disease.   MRI head 10/16/16 right external capsule/CR acute infarct  CTA head and neck 10/15/16 right ICA cavernous stenosis  2D Echo  not  ordered  LDL 72  HgbA1c 5.3 on 10/16/2016  SCDs for VTE prophylaxis  Diet NPO time specified  aspirin 81 mg daily and clopidogrel 75 mg daily prior to admission, now on aspirin 325mg  or suppository 300mg  daily  Patient counseled to be compliant with her antithrombotic medications  Ongoing aggressive stroke risk factor management  Therapy recommendations:  pending  Disposition:  Pending. Due to advanced age and severe neurological deficit with dysphagia, likely poor prognosis, recommend family discussion about palliative care.  Dysphasia  Severe dysarthria  Did not pass swallow  NPO currently  Further management per family discussion  History of stroke  12/2014 - MRI brain left external capsule infarct, MRA negative, carotid Doppler are negative, EF 60-65%, LDL 56 and A1c 5.5 - patient put on aspirin, no residual deficit  10/17/16 - left hemiparesis with dysarthria - MRI showed right external capsule/CR infarct, LDL 73, A1c 6.5, CTA head and neck right ICA cavernous stenosis - put on plavix - residue 3/5 left hemiparesis  Hypertension  Stable  Permissive hypertension (OK if < 220/120) but gradually normalize in 5-7 days  Long-term BP goal normotensive  Hyperlipidemia  Home meds:  Pravastatin 20mg  PO daily  LDL 72, goal < 70  Currently NPO  Other Stroke Risk Factors  Advanced age  Family hx stroke (father)  Other Problems  AAA  History of a colon rectal cancer  History of the herpes zoster in 2002 with postherpetic neuralgia  Hospital day # 0  Due to advanced age and severe neurological deficit with dysphagia, likely poor prognosis, recommend family discussion about palliative care.  Rosalin Hawking, MD PhD Stroke Neurology 10/22/2016 3:00 PM    To contact Stroke Continuity provider, please refer to [email or web address] After hours, contact General Neurology

## 2016-10-22 NOTE — Evaluation (Signed)
Speech Language Pathology Evaluation Patient Details Name: ANNALEE MEYERHOFF MRN: [id number] DOB: [birth year] Today's Date: 10/22/2016 Time: 5329-9242 SLP Time Calculation (min) (ACUTE ONLY): 8 min  Problem List:  Patient Active Problem List   Diagnosis Date Noted  . Pressure ulcer 10/16/2016  . CVA (cerebral vascular accident) ([place]) 10/15/2016  . Sepsis secondary to UTI ([place])   . Dysphagia   . Metabolic encephalopathy   . Dyslipidemia   . Hypokalemia 05/24/2016  . Hypernatremia   . Pressure injury of skin 05/22/2016  . Sepsis ([place]) 05/21/2016  . UTI (urinary tract infection) 05/21/2016  . Acute kidney injury ([place]) 05/21/2016  . Dehydration, severe 05/21/2016  . History of right hip hemiarthroplasty 04/19/16 05/21/2016  . Dementia 05/21/2016  . Complicated UTI (urinary tract infection)   . Displaced intertrochanteric fracture of right femur, initial encounter for closed fracture ([place]) 04/30/2016  . Displaced fracture of greater trochanter of right femur, initial encounter for closed fracture ([place])   . Closed displaced fracture of right femoral neck ([place]) 04/18/2016  . Confusion   . Stroke ([place])   . Syncope and collapse   . Cerebral thrombosis with cerebral infarction ([place]) 12/25/2014  . Acute encephalopathy 12/23/2014  . Urinary incontinence 02/10/2012  . Aneurysm of abdominal vessel ([place]) 04/13/2010  . CHOLELITHIASIS 04/13/2010  . NONSPEC ELEVATION OF LEVELS OF TRANSAMINASE/LDH 04/13/2010  . DIVERTICULOSIS, COLON 06/14/2008  . COLON CANCER, HX OF 06/14/2008  . THYROID NODULE, RIGHT 01/26/2007  . HYPERLIPIDEMIA 01/26/2007  . OSTEOPOROSIS NOS 01/26/2007  . POSTHERPETIC NEURALGIA 11/20/2006  . Essential hypertension 11/20/2006   Past Medical History:  Past Medical History:  Diagnosis Date  . AAA (abdominal aortic aneurysm) (HCC)    [name]  . Anxiety   . Arthritis   . Calculus of gallbladder without mention of cholecystitis or obstruction   . Cancer [facility])    Colon and  Rectal  . CVA (cerebral vascular accident) ([place]) 09/2016  . Diverticulosis of colon   . Heart murmur   . Herpes zoster of eye 2002   Dr. Herbert Deaner  . History of colon cancer 1995   Dr Lennie Hummer  . Hyperlipidemia   . Hypertension   . Osteoporosis   . Post herpetic neuralgia   . Varicose veins    Past Surgical History:  Past Surgical History:  Procedure Laterality Date  . COLON SURGERY     RESECTION in 1990s  . ENDOVASCULAR STENT INSERTION  08/08/2011   Procedure: ENDOVASCULAR STENT GRAFT INSERTION;  Surgeon: [name], MD;  Location: [facility] OR;  Service: Vascular;  Laterality: N/A;  Endo Vascular Aortic stent placement with gore  . EYE SURGERY     CAT EXT  OU  . HIP ARTHROPLASTY Right 04/19/2016   Procedure: ARTHROPLASTY BIPOLAR HIP (HEMIARTHROPLASTY);  Surgeon: [name], MD;  Location: [place];  Service: Orthopedics;  Laterality: Right;  . HIP ARTHROPLASTY Right 04/20/2016   Procedure: REVISION ARTHROPLASTY BIPOLAR HIP (HEMIARTHROPLASTY);  Surgeon: [name], MD;  Location: [place];  Service: Orthopedics;  Laterality: Right;  . HIP CLOSED REDUCTION Right 04/19/2016   Procedure: CLOSED MANIPULATION HIP;  Surgeon: [name], MD;  Location: [place];  Service: Orthopedics;  Laterality: Right;  . TONSILLECTOMY     HPI:  Pt is a 83 y.o. female who was recently admitted for acute CVA before d/c to SNF on 6/28. She returned on 7/2 due to worsening L sided weakness and dysarthria. CT showed an acute R thalamus lacunar infarct. Pt  had MBS during recent admission that revealed flash penetration of thin and nectar thick liquids, recommending Dys 1 diet and thin liquids. PMH includes dementia, HTN, CVA, anxiety, dysphagia   Assessment / Plan / Recommendation Clinical Impression  Pt's cognitive and communicative function is reduced from recent hospitalization, at which time she was oriented x3, conversant, and able to follow commands well. Today she is oriented to person only and shows  moderate deficits with sustained attention, command following, and overall awareness. Her attempts to communicate are limited and marked by a moderate-severe dysarthria due to low vocal intensity and imprecise articulation that reduced intelligibility at the word and phrase level. She does respond to simple, biographical/environmental questions accurately, making this a more reliable comunication method for her at this time given the severity of her dysarthria. Will continue to follow to maximize functional communication and safety as it remains in line with pt/family GOC.    SLP Assessment  SLP Recommendation/Assessment: Patient needs continued Speech Lanaguage Pathology Services SLP Visit Diagnosis: Dysarthria and anarthria (R47.1);Cognitive communication deficit (R41.841)    Follow Up Recommendations   (tba)    Frequency and Duration min 2x/week  2 weeks      SLP Evaluation Cognition  Overall Cognitive Status: Impaired/Different from baseline Arousal/Alertness: Awake/alert Orientation Level: Oriented to person Attention: Sustained Sustained Attention: Impaired Sustained Attention Impairment: Functional basic Awareness: Impaired Awareness Impairment: Intellectual impairment;Emergent impairment;Anticipatory impairment Problem Solving: Impaired Problem Solving Impairment: Functional basic       Comprehension  Auditory Comprehension Overall Auditory Comprehension: Impaired Yes/No Questions: Within Functional Limits (for basic, biographical /environmental questions) Commands: Impaired One Step Basic Commands: 50-74% accurate    Expression Expression Primary Mode of Expression: Verbal Verbal Expression Overall Verbal Expression: Other (comment) (difficult to assess given level of dysarthria)   Oral / Motor  Oral Motor/Sensory Function Overall Oral Motor/Sensory Function: Moderate impairment Facial ROM: Reduced left;Suspected CN VII (facial) dysfunction Facial Symmetry: Abnormal  symmetry left;Suspected CN VII (facial) dysfunction Facial Strength: Reduced left;Suspected CN VII (facial) dysfunction Motor Speech Overall Motor Speech: Impaired Respiration: Impaired Level of Impairment: Word Phonation: Low vocal intensity Articulation: Impaired Level of Impairment: Word Intelligibility: Intelligibility reduced Word: 25-49% accurate   GO          Functional Assessment Tool Used: skilled clinical judgment Functional Limitations: Motor speech Swallow Current Status [phone]): At least 80 percent but less than 100 percent impaired, limited or restricted Swallow Goal Status [phone]): At least 60 percent but less than 80 percent impaired, limited or restricted Motor Speech Current Status [phone]): At least 80 percent but less than 100 percent impaired, limited or restricted Motor Speech Goal Status [phone]): At least 60 percent but less than 80 percent impaired, limited or restricted         Germain Osgood 10/22/2016, 11:53 AM  Germain Osgood, M.A. CCC-SLP [phone]

## 2016-10-22 NOTE — Progress Notes (Signed)
Pt's BP read 173/114, HR 115, on call DR Frederico Hamman paged and notified, said to be okay for now, no new medication ordered, will however continue to monitor. Obasogie-Asidi, Talesha Ellithorpe Efe

## 2016-10-22 NOTE — Progress Notes (Signed)
   Subjective: Rebecca Knight's communication this morning was minimally improved. She was able to follow simple commands and attempted to respond to questions. ROS was difficult to obtain due to communication barrier, but did deny any pain.   Her son Rebecca Knight - [phone]) was contacted via phone. We had a thorough discussion about Rebecca Knight's care. He confirmed that he still only wants IV fluids and antibiotics if needed. He would prefer he to continue antiplatelet therapy. It was discussed that she had a speech evaluation and is at serve risk of aspiration. He does not want a feeding tube placed. He is unconformable with discharge today. We will keep her overnight with plans on an in-person discussion tomorrow.   Objective:  Vital signs in last 24 hours: Vitals:   10/22/16 0534 10/22/16 0600 10/22/16 0938 10/22/16 1331  BP: (!) 168/98  139/81 (!) 158/92  Pulse: 82  (!) 102 98  Resp: 20  16 18   Temp: 97.8 F (36.6 C)  98 F (36.7 C) 98.7 F (37.1 C)  TempSrc: Oral  Oral Oral  SpO2: 99%  98% 99%  Weight:  104 lb 1.6 oz (47.2 kg)    Height:  5\' 1"  (1.549 m)     Physical Exam  HENT:  Head: Normocephalic and atraumatic.  Eyes: Conjunctivae are normal. Pupils are equal, round, and reactive to light.  Cardiovascular: Normal rate, regular rhythm, normal heart sounds and intact distal pulses.   Pulmonary/Chest: Effort normal and breath sounds normal.  Abdominal: Soft. She exhibits no distension. There is no tenderness.  Neurological: She displays facial asymmetry and abnormal speech. A cranial nerve deficit (Cranial nerves VII and XII) is present.  Unable to move her left side extremities. Able to turn her head to the left. No neglect noted. Touch sensation intact on the left side.   Skin: Skin is warm and dry.    Assessment/Plan:  Principal Problem:   CVA (cerebral vascular accident) [facility])  Rebecca Knight is a 83 y.o. Female with a PMHx of CVA previously discharged on 10/17/2016 on Plavix and ASA  presenting on 10/21/2016 with worsening left sided weakness and dysarthria.   CVA. Recently discharged on 10/17/2016 on ASA and Plavix. Presented with worsening left sided weakness. Neurology consulted discussed option of pursuing aggressive work-up including CT angio head and neck, continue antiplatelet therapy, and pursue PT and OT.  - Discussion with son Rebecca Knight) and reviewed Most form. No aggressive measures, no feeding tubes, no further imaging. Focus quality of life. At this point he is not comfortable with her being discharged. Will have family meeting tomorrow at noon.  - Speech evaluation completed. Severe risk of aspiration. Risks and benefits discussed with family. Appear to understand that if Rebecca Knight attempts to eat she may cough and food/fluid may go into her lungs.  - Will continue with ASA rectal for now. Will plan thorough discussion with family tomorrow to determine next step.  - Comfort care, vitals per unit standards, no telemetry, neuro assessments q4hr  Hypertension. Allow permissive HTN.  - Gentle hydration with IV Fluids - PRN IV Labetalol if SBP > 220 or DBP > 120  Dispo: Anticipated discharge in approximately 1 day(s).   Ina Homes, MD 10/22/2016, 2:50 PM Pager: My Pager: [phone]

## 2016-10-23 DIAGNOSIS — R1312 Dysphagia, oropharyngeal phase: Secondary | ICD-10-CM

## 2016-10-23 DIAGNOSIS — Z8673 Personal history of transient ischemic attack (TIA), and cerebral infarction without residual deficits: Secondary | ICD-10-CM

## 2016-10-23 MED ORDER — WHITE PETROLATUM GEL
Status: AC
  Administered 2016-10-23: 1
  Filled 2016-10-23: qty 1

## 2016-10-23 NOTE — NC FL2 (Signed)
[place] MEDICAID FL2 LEVEL OF CARE SCREENING TOOL     IDENTIFICATION  Patient Name: Rebecca Knight Birthdate: 29-Apr-1922 Sex: female Admission Date (Current Location): 10/21/2016  [facility] and [place] Number:  Herbalist and Address:  [facility], [place] [address], [place], [place] [address]      Provider Number: [id number]  Attending Physician Name and Address:  Oval Linsey, MD  Relative Name and Phone Number:       Current Level of Care: Hospital Recommended Level of Care: [place] Prior Approval Number:    Date Approved/Denied:   PASRR Number: [id number]  Discharge Plan: SNF    Current Diagnoses: Patient Active Problem List   Diagnosis Date Noted  . Acute ischemic vertebrobasilar artery thalamic stroke, right ([place])   . Pressure ulcer 10/16/2016  . CVA (cerebral vascular accident) ([place]) 10/15/2016  . Sepsis secondary to UTI ([place])   . Dysphagia   . Metabolic encephalopathy   . Dyslipidemia   . Hypokalemia 05/24/2016  . Hypernatremia   . Pressure injury of skin 05/22/2016  . Sepsis ([place]) 05/21/2016  . UTI (urinary tract infection) 05/21/2016  . Acute kidney injury ([place]) 05/21/2016  . Dehydration, severe 05/21/2016  . History of right hip hemiarthroplasty 04/19/16 05/21/2016  . Dementia 05/21/2016  . Complicated UTI (urinary tract infection)   . Displaced intertrochanteric fracture of right femur, initial encounter for closed fracture ([place]) 04/30/2016  . Displaced fracture of greater trochanter of right femur, initial encounter for closed fracture ([place])   . Closed displaced fracture of right femoral neck ([place]) 04/18/2016  . Confusion   . Stroke ([place])   . Syncope and collapse   . Cerebral thrombosis with cerebral infarction ([place]) 12/25/2014  . Acute encephalopathy 12/23/2014  . Urinary incontinence 02/10/2012  . Aneurysm of abdominal vessel ([place]) 04/13/2010  . CHOLELITHIASIS 04/13/2010  . NONSPEC ELEVATION  OF LEVELS OF TRANSAMINASE/LDH 04/13/2010  . DIVERTICULOSIS, COLON 06/14/2008  . COLON CANCER, HX OF 06/14/2008  . THYROID NODULE, RIGHT 01/26/2007  . HYPERLIPIDEMIA 01/26/2007  . OSTEOPOROSIS NOS 01/26/2007  . POSTHERPETIC NEURALGIA 11/20/2006  . Essential hypertension 11/20/2006    Orientation RESPIRATION BLADDER Height & Weight     Self  Normal Incontinent, External catheter Weight: 104 lb 1.6 oz (47.2 kg) Height:  5\' 1"  (154.9 cm)  BEHAVIORAL SYMPTOMS/MOOD NEUROLOGICAL BOWEL NUTRITION STATUS      Incontinent Diet (dysphagia III, comfort)  AMBULATORY STATUS COMMUNICATION OF NEEDS Skin   Extensive Assist Verbally PU Stage and Appropriate Care   PU Stage 2 Dressing:  (PRN)                   Personal Care Assistance Level of Assistance  Bathing, Dressing Bathing Assistance: Maximum assistance   Dressing Assistance: Maximum assistance     Functional Limitations Info             SPECIAL CARE FACTORS FREQUENCY        PT Frequency: will assess OT Frequency: will assess            Contractures      Additional Factors Info  Code Status, Allergies Code Status Info: DNR Allergies Info: Penicillins           Current Medications (10/23/2016):  This is the current hospital active medication list Current Facility-Administered Medications  Medication Dose Route Frequency Provider Last Rate Last Dose  . 0.45 % sodium chloride infusion   Intravenous Continuous Ina Homes, MD 100 mL/hr  at 10/23/16 0334    . acetaminophen (TYLENOL) tablet 650 mg  650 mg Oral Q4H PRN Shela Leff, MD       Or  . acetaminophen (TYLENOL) solution 650 mg  650 mg Per Tube Q4H PRN Shela Leff, MD       Or  . acetaminophen (TYLENOL) suppository 650 mg  650 mg Rectal Q4H PRN Shela Leff, MD      . aspirin EC tablet 325 mg  325 mg Oral Daily Rosalin Hawking, MD       Or  . aspirin suppository 300 mg  300 mg Rectal Daily Rosalin Hawking, MD   300 mg at 10/23/16 1104  .  chlorhexidine (PERIDEX) 0.12 % solution 15 mL  15 mL Mouth Rinse BID Oval Linsey, MD   15 mL at 10/23/16 1103  . hydroxypropyl methylcellulose / hypromellose (ISOPTO TEARS / GONIOVISC) 2.5 % ophthalmic solution 1 drop  1 drop Both Eyes QID Oval Linsey, MD   1 drop at 10/23/16 1104  . labetalol (NORMODYNE,TRANDATE) injection 5 mg  5 mg Intravenous Q10 min PRN Oval Linsey, MD      . lip balm (BLISTEX) ointment   Topical PRN Oval Linsey, MD      . MEDLINE mouth rinse  15 mL Mouth Rinse q12n4p Oval Linsey, MD   15 mL at 10/23/16 1000  . pravastatin (PRAVACHOL) tablet 20 mg  20 mg Oral q1800 Patteson, Arlan Organ, NP         Discharge Medications: Please see discharge summary for a list of discharge medications.  Relevant Imaging Results:  Relevant Lab Results:   Additional Information SS#: [id number]  Geralynn Ochs, LCSW

## 2016-10-23 NOTE — Progress Notes (Signed)
   Subjective: Elantra was resting comfortably this am. Communication was again an issue. Able to speak a little more than yesterday. Followed commands. ROS was difficult to obtain due to limited communication. Denies any pain, thirst, hunger, or being hot/cold.   Both Dr. Virgina Jock and myself had an in-depth conversation with Lateya's son, Truman Hayward, yesterday. Discussed the goals of care which essentially boil down to comfort. Still comfortable with an IV and requesting be continue to the ASA suppository. Discussed attempting to eat and the severe risk of aspiration. Truman Hayward agreed that we would initiate a dysphagia diet for comfort only. Mirka would only attempt to eat if she elected. A feeding tube was discussed, Truman Hayward elected not to do a feeding tube. Explained the risk of aspiration is still present and discussed the benefits versus the harm.   Objective:  Vital signs in last 24 hours: Vitals:   10/22/16 1731 10/22/16 2158 10/23/16 0044 10/23/16 0432  BP: (!) 146/88 (!) 173/114 (!) 195/117 (!) 187/110  Pulse: 94 (!) 115 71 92  Resp: 16 18 20 20   Temp: 98.4 F (36.9 C) 98.6 F (37 C) 97.7 F (36.5 C) (!) 97.4 F (36.3 C)  TempSrc: Oral Oral Axillary Axillary  SpO2: 99% 99% 95% 96%  Weight:      Height:       Physical Exam  Constitutional:  Sleeping in bed comfortably. Attempts to talk/answer questions   HENT:  Head: Normocephalic and atraumatic.  Cardiovascular: Normal rate, regular rhythm, normal heart sounds and intact distal pulses.   Pulmonary/Chest: Effort normal. No respiratory distress. She exhibits no tenderness.  Abdominal: Soft. Bowel sounds are normal.  Musculoskeletal: She exhibits no edema.  Neurological: She is alert.  Left hemiplegia (arm and leg equally affected), touch sensation intact. Left facial droop.   Skin: Skin is warm and dry.    Assessment/Plan:  Principal Problem:   CVA (cerebral vascular accident) [facility]) Active Problems:   Acute ischemic vertebrobasilar  artery thalamic stroke, right [facility])  Nadiya is a 83 y.o. Female with a PMHx of CVA previously discharged on 10/17/2016 on Plavix and ASA presenting on 10/21/2016 with worsening left sided weakness and dysarthria.   Goals of Care. Both Dr. Virgina Jock and myself had an in-depth conversation with Ahmaya's son, Truman Hayward, yesterday. Discussed the goals of care which essentially boil down to comfort. Still comfortable with IV fluids and requesting we continue the ASA suppository. Discussed attempting to eat and the severe risk of aspiration. Truman Hayward agreed that we would initiate a dysphagia diet for comfort only. Jasmarie would only attempt to eat, with assistance, if she elected. A feeding tube was discussed, Truman Hayward elected not to do a feeding tube. Explained the risk of aspiration is still present and discussed the benefits versus the harm.  - Will try to discharge back to [facility] today. - Social work onboard. Appreciate your help  CVA.Recently discharged on 10/17/2016 on ASA and Plavix. Presented with worsening left sided weakness. Neurology consulted discussed option of pursuing aggressive work-up including CT angio head and neck, continue antiplatelet therapy, and pursue PT and OT.  - Speech evaluation completed. Severe risk of aspiration. Risks and benefits discussed with family.  - Will continue with ASA suppository.   Hypertension.Allow permissive HTN.  - Gentle hydration with IV Fluids - PRN IV Labetalol if SBP > 220 or DBP > 120  Dispo: Anticipated discharge in approximately 0-1 day(s).   Ina Homes, MD 10/23/2016, 8:56 AM Pager: My Pager: [phone]

## 2016-10-23 NOTE — Progress Notes (Signed)
Internal Medicine Attending  Date: 10/23/2016  Patient name: Rebecca Knight Medical record number: [id number] Date of birth: February 12, 1923 Age: 83 y.o. Gender: female  I saw and evaluated the patient. I reviewed the resident's note by Dr. Tarri Abernethy and I agree with the resident's findings and plans as documented in his progress note.  When seen on rounds today her neuro examination was essentially unchanged from yesterday. She does have some upper respiratory sounds on inspiration that are not consistent with stridor. Airway protraction remains a significant concern but comfort feeding is the plan at this point after determining the importance of comfort over length of life with the family yesterday. Her son Truman Hayward has elected to defer placement of the feeding tube allowing more time to assess for improvement in swallowing. He was again reminded yesterday during our conversation that the feeding tube would not change the course of her disease or correct any deficits that are currently present. In addition, she will always remain at risk for aspiration with or without the feeding tube. He needs more time to process the significant change in his mother's clinical status. The plan is to transfer her back to the skilled nursing facility today.

## 2016-10-23 NOTE — Discharge Summary (Signed)
Name: NIJA KOOPMAN MRN: [id number] DOB: May 17, 1922 83 y.o. PCP: [name], DO  Date of Admission: 10/21/2016  2:20 PM Date of Discharge: 10/24/2016 Attending Physician: Oval Linsey, MD  Discharge Diagnosis: 1. CVA 2. Essential Hypertension   Principal Problem:   CVA (cerebral vascular accident) [facility]) Active Problems:   Essential hypertension   Acute ischemic vertebrobasilar artery thalamic stroke, right ([place])   History of stroke   Discharge Medications: Allergies as of 10/24/2016      Reactions   Penicillins Rash   Has patient had a PCN reaction causing immediate rash, facial/tongue/throat swelling, SOB or lightheadedness with hypotension: Yes Has patient had a PCN reaction causing severe rash involving mucus membranes or skin necrosis: Yes Has patient had a PCN reaction that required hospitalization No Has patient had a PCN reaction occurring within the last 10 years: No If all of the above answers are "NO", then may proceed with Cephalosporin use.      Medication List    TAKE these medications   acetaminophen 325 MG tablet Commonly known as:  TYLENOL Take 2 tablets (650 mg total) by mouth every 6 (six) hours as needed for mild pain (or Fever >/= 101).   amLODipine 2.5 MG tablet Commonly known as:  NORVASC Take 1 tablet (2.5 mg total) by mouth daily.   aspirin EC 81 MG tablet Take 81 mg by mouth daily.   clopidogrel 75 MG tablet Commonly known as:  PLAVIX Take 1 tablet (75 mg total) by mouth daily.   gabapentin 100 MG capsule Commonly known as:  NEURONTIN Take 100 mg by mouth 2 (two) times daily. 100mg  twice daily, 300mg  at bedtime   gabapentin 300 MG capsule Commonly known as:  NEURONTIN Take 300 mg by mouth at bedtime.   losartan 100 MG tablet Commonly known as:  COZAAR Take 1 tablet by mouth daily   magnesium hydroxide 400 MG/5ML suspension Commonly known as:  MILK OF MAGNESIA Take 30 mLs by mouth daily as needed for mild constipation.     metoprolol succinate 25 MG 24 hr tablet Commonly known as:  TOPROL-XL Take 1 tablet (25 mg total) by mouth daily.   mirtazapine 15 MG disintegrating tablet Commonly known as:  REMERON SOL-TAB Take 7.5 mg by mouth at bedtime.   multivitamins ther. w/minerals Tabs tablet Take 1 tablet by mouth daily. Thera-M Caplet   pravastatin 20 MG tablet Commonly known as:  PRAVACHOL 1 BY MOUTH DAILY   senna 8.6 MG tablet Commonly known as:  SENOKOT Take 1 tablet (8.6 mg total) by mouth 2 (two) times daily as needed for constipation.   SYSTANE 0.4-0.3 % Soln Generic drug:  Polyethyl Glycol-Propyl Glycol Place 1 drop into both eyes 4 (four) times daily. Reported on 05/13/2015   vitamin C 500 MG tablet Commonly known as:  ASCORBIC ACID Take 500 mg by mouth 2 (two) times daily.       Disposition and follow-up:   Ms.Marelin G Pattillo was discharged from [facility] in Stable condition.  At the hospital follow up visit please address:  1.  No follow-up questions. Ensure she is comfortable.   2.  Labs / imaging needed at time of follow-up: N/A  3.  Pending labs/ test needing follow-up: N/A  Follow-up Appointments: N/A  Hospital Course by problem list: Principal Problem:   CVA (cerebral vascular accident) [facility]) Active Problems:   Essential hypertension   Acute ischemic vertebrobasilar artery thalamic stroke, right ([place])   History of stroke  1. CVA Mrs. Quetzaly Ebner is a 83 y.o female with a past medical history significant for CVA, hypertension, and dyslipidemia who presented to the ED on 10/21/2016 for worsening left sided weakness, dysphagia, and dysarthria. She was recently discharged from the hospital on 10/17/2016 for CVA and placed on Aspirin and Plavix. MRI on 10/16/2016 showed acute infarct affecting the right external capsule and radiating white matter tracts, along with diffuse chronic small vessel ischemia. CT on 10/21/2016 illustrated acute right thalamus  lacunar infarct. Neurology was consulted. Recommended that if aggressive work-up is desired by family they suggest work-up including CT angio of the head and neck, continue antiplatelet therapy, and pursue PT/OT evaluation. Options were thoroughly discussed with Kitti's son Truman Hayward. He elected to not pursue aggressive measures and instead focus on comfort. Truman Hayward reviewed the MOST form, Tedi was made DNR, and elected to continue antiplatelet therapy. Speech consult for swallow study reveal severe risk of aspiration. Options were discussed with Truman Hayward, who elected not to place a feeding tube and instead initiate a Grade 1 Dysphagia diet for comfort only. Social work was consulted on 10/23/2016 in order to get Anushka back to [place]. Continue ASA suppository. Discharged to [place] on 10/24/2016.   2. Essential Hypertension.  Due to the CVA, we allowed permissive hypertension. She was given a prn Labetalol order if her SBP >220 or DBP >120. She did not receive any. Due to her severe dysphagia it may be difficult to resume her home medication. However we do recommend a trial of oral medications.   Discharge Vitals:   BP (!) 174/95 (BP Location: Left Arm)   Pulse (!) 103   Temp 97.7 F (36.5 C) (Oral)   Resp 20   Ht 5\' 1"  (1.549 m)   Wt 104 lb 1.6 oz (47.2 kg)   SpO2 97%   BMI 19.67 kg/m   Pertinent Labs, Studies, and Procedures:  CT Head w/out Contrast IMPRESSION: 1. Acute RIGHT thalamus lacunar infarct. 2. Old basal ganglia lacunar infarcts. Old RIGHT cerebellar infarct. 3. Severe chronic small vessel ischemic disease. 4. ASPECTS is 10. 5. Critical Value/emergent results were called by telephone at the time of interpretation on 10/21/2016 at 2:39 pm to Dr. Leonel Ramsay Neurology, who verbally acknowledged these results.  Discharge Instructions: Discharge Instructions    Diet - low sodium heart healthy    Complete by:  As directed    Increase activity slowly    Complete by:  As directed         Signed: Ina Homes, MD 10/24/2016, 12:32 PM   Pager: My Pager: [phone]

## 2016-10-23 NOTE — Evaluation (Signed)
Physical Therapy Evaluation Patient Details Name: Rebecca Knight MRN: [id number] DOB: March 22, 1923 Today's Date: 10/23/2016   History of Present Illness  Rebecca Knight is a 83 year old woman with a history of hypertension, hyperlipidemia, abdominal aortic aneurysm, and recent right thalamus CVA who was discharged from the internal medicine teaching service on 10/17/2016 after assessment for an acute right thalamic stroke. She was doing well at the skilled nursing facility until the morning of admission when she was noted to have acute flaccid paralysis of the left hemibody with dysarthric speech. Head CT demonstrated a right thalamic stroke which felt to be an extension of the recent stroke. She is currently comfort measures only.  Clinical Impression  Patient presents severe dependencies in all mobility due to hemiplegia and weakness.  Patient currently for comfort care measures.  Feel patient will benefit from continued PT in SNF for positioning, family education, and ROM.  No acute care PT needs identified and will sign off.      Follow Up Recommendations SNF    Equipment Recommendations  None recommended by PT    Recommendations for Other Services       Precautions / Restrictions Precautions Precautions: Fall      Mobility  Bed Mobility Overal bed mobility: Needs Assistance Bed Mobility: Rolling;Supine to Sit;Sit to Supine Rolling: Mod assist   Supine to sit: Total assist Sit to supine: Total assist      Transfers                    Ambulation/Gait                Stairs            Wheelchair Mobility    Modified Rankin (Stroke Patients Only)       Balance Overall balance assessment: Needs assistance Sitting-balance support: Single extremity supported;Feet supported Sitting balance-Leahy Scale: Zero                                       Pertinent Vitals/Pain Pain Assessment: Faces Faces Pain Scale: No hurt    Home Living  Family/patient expects to be discharged to:: Skilled nursing facility     Type of Home: [place]                Prior Function Level of Independence: Needs assistance   Gait / Transfers Assistance Needed: per last admission, prior to that admission pt used a w/c for mobility and required assistance for transfers.  At last admission, patient was +2 for transfers and bed mobility.  ADL's / Homemaking Assistance Needed: per last admission:  Pt is dependent in all ADL with the exception of feeding, Pt was able to feed self        Hand Dominance        Extremity/Trunk Assessment   Upper Extremity Assessment Upper Extremity Assessment: RUE deficits/detail;LUE deficits/detail RUE Deficits / Details: generalized weakness but movement in all planes LUE Deficits / Details: flaccid, no movement noted    Lower Extremity Assessment Lower Extremity Assessment: RLE deficits/detail;LLE deficits/detail RLE Deficits / Details: able to raise LE off bed and DF/PF ankle, generalized weakness.  Unable to reach neutral for dorsiflexion, unable to reach full knee extension LLE Deficits / Details: flaccid, no movement noted       Communication   Communication: Expressive difficulties  Cognition Arousal/Alertness: Awake/alert Behavior During Therapy: [facility] for  tasks assessed/performed                                   General Comments: pt with dementia at baseline, but following one step commands appropriately throughout      General Comments      Exercises     Assessment/Plan    PT Assessment All further PT needs can be met in the next venue of care  PT Problem List Decreased strength;Decreased range of motion;Decreased activity tolerance;Decreased balance;Decreased mobility;Decreased cognition       PT Treatment Interventions      PT Goals (Current goals can be found in the Care Plan section)  Acute Rehab PT Goals Patient Stated Goal: none  stated PT Goal Formulation: All assessment and education complete, DC therapy (signing off, patient will benefit from continued services at [facility])    Frequency     Barriers to discharge        Co-evaluation               AM-PAC PT "6 Clicks" Daily Activity  Outcome Measure Difficulty turning over in bed (including adjusting bedclothes, sheets and blankets)?: Total Difficulty moving from lying on back to sitting on the side of the bed? : Total Difficulty sitting down on and standing up from a chair with arms (e.g., wheelchair, bedside commode, etc,.)?: Total Help needed moving to and from a bed to chair (including a wheelchair)?: Total Help needed walking in hospital room?: Total Help needed climbing 3-5 steps with a railing? : Total 6 Click Score: 6    End of Session   Activity Tolerance: Patient tolerated treatment well Patient left: in bed;with bed alarm set;with call bell/phone within reach   PT Visit Diagnosis: Hemiplegia and hemiparesis Hemiplegia - Right/Left: Left Hemiplegia - dominant/non-dominant: Non-dominant Hemiplegia - caused by: Cerebral infarction    Time: 3943-2003 PT Time Calculation (min) (ACUTE ONLY): 17 min   Charges:   PT Evaluation $PT Eval Low Complexity: 1 Procedure     PT G Codes:        10-24-2016 [place], PT [phone]    Shanna Cisco 2016/10/24, 1:52 PM

## 2016-10-23 NOTE — Progress Notes (Signed)
STROKE TEAM PROGRESS NOTE   SUBJECTIVE (INTERVAL HISTORY) She is awake but lethargic and severely dysarthric. No significant change from yesterday. She still has left hemiplegia with seems left visual field deficit.   OBJECTIVE Temp:  [97.4 F (36.3 C)-98.9 F (37.2 C)] 98.9 F (37.2 C) (07/04 0943) Pulse Rate:  [71-115] 111 (07/04 0943) Resp:  [16-20] 20 (07/04 0432) BP: (146-195)/(88-117) 166/104 (07/04 0943) SpO2:  [95 %-99 %] 97 % (07/04 0943)  CBC:   Recent Labs Lab 10/21/16 1420 10/21/16 1427 10/22/16 0315  WBC 11.9*  --  11.3*  NEUTROABS 8.8*  --   --   HGB 13.2 13.9 12.9  HCT 43.2 41.0 42.2  MCV 83.9  --  83.9  PLT 203  --  009    Basic Metabolic Panel:   Recent Labs Lab 10/17/16 0502 10/21/16 1420 10/21/16 1427  NA 141 147* 146*  K 3.1* 3.8 3.8  CL 103 106 105  CO2 28 30  --   GLUCOSE 112* 126* 124*  BUN 15 36* 37*  CREATININE 0.70 0.88 0.90  CALCIUM 10.3 10.7*  --     Lipid Panel:     Component Value Date/Time   CHOL 141 10/16/2016 0618   TRIG 116 10/16/2016 0618   HDL 45 10/16/2016 0618   CHOLHDL 3.1 10/16/2016 0618   VLDL 23 10/16/2016 0618   LDLCALC 73 10/16/2016 0618   HgbA1c:  Lab Results  Component Value Date   HGBA1C 5.3 10/16/2016   Urine Drug Screen:     Component Value Date/Time   LABOPIA NONE DETECTED 12/23/2014 1251   COCAINSCRNUR NONE DETECTED 12/23/2014 1251   LABBENZ NONE DETECTED 12/23/2014 1251   AMPHETMU NONE DETECTED 12/23/2014 1251   THCU NONE DETECTED 12/23/2014 1251   LABBARB NONE DETECTED 12/23/2014 1251    Alcohol Level     Component Value Date/Time   ETH <5 10/21/2016 1415    IMAGING I have personally reviewed the radiological images below and agree with the radiology interpretations.  Ct Head Code Stroke W/o Cm 10/21/2016 IMPRESSION: 1. Acute RIGHT thalamus lacunar infarct. 2. Old basal ganglia lacunar infarcts. Old RIGHT cerebellar infarct. 3. Severe chronic small vessel ischemic disease. 4. ASPECTS  is 10.   Ct Angio Head and neck W and Wo Contrast 10/15/2016 IMPRESSION: 1. Negative for emergent large vessel occlusion. 2. Positive for moderate to severe distal right ICA stenosis, due to calcified plaque at the distal supraclinoid segment. The right ICA terminus and right anterior circulation remain patent. 3. Mild to moderate right PCA atherosclerotic stenosis. 4. Extracranial arteries are tortuous, but with minimal atherosclerosis and there is no significant stenosis in the neck. 5. Ectatic aortic arch and proximal descending thoracic aorta with mild to moderate calcified atherosclerosis.  Mr Brain Wo Contrast 10/16/2016 IMPRESSION: Acute infarction in the right external capsule/ radiating white matter tracts. Extensive chronic small vessel ischemic changes elsewhere throughout the brain as outlined above.    PHYSICAL EXAM  Temp:  [97.4 F (36.3 C)-98.9 F (37.2 C)] 98.9 F (37.2 C) (07/04 0943) Pulse Rate:  [71-115] 111 (07/04 0943) Resp:  [16-20] 20 (07/04 0432) BP: (146-195)/(88-117) 166/104 (07/04 0943) SpO2:  [95 %-99 %] 97 % (07/04 0943)  General - Well nourished, well developed, lethargic and severely dysarthric.  Ophthalmologic - Fundi not visualized due to noncooperation.  Cardiovascular - Regular rate and rhythm.  Neuro - lethargic, severely dysarthric, able to most follow simple commands, however, psychomotor slowing. Possibility of speech, able to name and  repeat, however with great difficulty. PERRL, EOMI, blinking to visual threat on the right side, however inconsistently on the left, concerning for left neglect versus hemianopsia. Left mild facial asymmetry, tongue protrusion towards left. Left hemiplegia, 0/5 on pain stimulation. Right upper and lower extremity spontaneous movement, at least 4/5. DTR 1+, Babinski negative. Sensation, coordination and gait not tested.   ASSESSMENT/PLAN Rebecca Knight is a 83 y.o. female with history of AAA, hypertension,  hyperlipidemia, heart murmur, rectal and colon cancer (in remission), and prior strokes presenting with left hemiplegia, time of onset between 11:00am and 12:30pm. She did not receive IV t-PA due to recent infarct.   Stroke: Acute RIGHT thalamus lacunar infarct in the setting of recent right BG/CR infarcts and extensive chronic small vessel ischemic changes.  Resultant  left hemiplegia, severe dysarthria  CT head:  1. Acute RIGHT thalamus lacunar infarct. 2. Old basal ganglia lacunar infarcts. Old RIGHT cerebellar infarct. 3. Severe chronic small vessel ischemic disease.   MRI head 10/16/16 right external capsule/CR acute infarct  CTA head and neck 10/15/16 right ICA cavernous stenosis  2D Echo  not ordered  LDL 72  HgbA1c 5.3 on 10/16/2016  SCDs for VTE prophylaxis DIET - DYS 1 Room service appropriate? Yes with Assist; Fluid consistency: Thin  aspirin 81 mg daily and clopidogrel 75 mg daily prior to admission, now on aspirin 325mg  or suppository 300mg  daily  Patient counseled to be compliant with her antithrombotic medications  Ongoing aggressive stroke risk factor management  Therapy recommendations:  SNF  Disposition:  Back to SNF today likely. Due to advanced age and severe neurological deficit with dysphagia, likely poor prognosis, recommend family discussion about palliative care.  Dysphasia  Severe dysarthria  Did not pass swallow  Family declined feeding tube  Comfort feeding at this time  History of stroke  12/2014 - MRI brain left external capsule infarct, MRA negative, carotid Doppler are negative, EF 60-65%, LDL 56 and A1c 5.5 - patient put on aspirin, no residual deficit  10/17/16 - left hemiparesis with dysarthria - MRI showed right external capsule/CR infarct, LDL 73, A1c 6.5, CTA head and neck right ICA cavernous stenosis - put on plavix - residue 3/5 left hemiparesis  Hypertension  Stable  Permissive hypertension (OK if < 220/120) but gradually normalize  in 5-7 days  Long-term BP goal normotensive  Hyperlipidemia  Home meds:  Pravastatin 20mg  PO daily  LDL 72, goal < 70  Currently NPO  Other Stroke Risk Factors  Advanced age  Family hx stroke (father)  Other Problems  AAA  History of a colon rectal cancer  History of the herpes zoster in 2002 with postherpetic neuralgia  Hospital day # 1  Due to advanced age and severe neurological deficit with dysphagia, likely poor prognosis, recommend family discussion about palliative care.   Neurology will sign off. Please call with questions. No neuro follow up needed at this time. Thanks for the consult.  Rosalin Hawking, MD PhD Stroke Neurology 10/23/2016 11:51 AM    To contact Stroke Continuity provider, please refer to [email or web address] After hours, contact General Neurology

## 2016-10-23 NOTE — Clinical Social Work Note (Signed)
Clinical Social Work Assessment  Patient Details  Name: Rebecca Knight MRN: [id number] Date of Birth: September 28, 1922  Date of referral:  10/23/16               Reason for consult:  Facility Placement, Discharge Planning                Permission sought to share information with:  Facility Sport and exercise psychologist, Family Supports Permission granted to share information::  Yes, Verbal Permission Granted  Name::     Agricultural consultant::  SNF  Relationship::  Son  Sport and exercise psychologist Information:     Housing/Transportation Living arrangements for the past 2 months:  [place] of Information:  Adult Children Patient Interpreter Needed:  None Criminal Activity/Legal Involvement Pertinent to Current Situation/Hospitalization:  No - Comment as needed Significant Relationships:  Adult Children Lives with:  Self, Facility Resident Do you feel safe going back to the place where you live?  Yes Need for family participation in patient care:  Yes (Comment) (patient currently not oriented)  Care giving concerns:  Patient from [facility] long term, and has no concerns about care giving received.   Social Worker assessment / plan:  CSW reintroduced self to patient's son, Rebecca Knight, over the phone, and reminded of role. CSW indicated that she had been coordinating the discharge plan when the patient was at the hospital last week. Patient's son indicated that the patient would be returning to [facility] when ready to discharge. CSW checked in with facility and confirmed bed availability. CSW obtained information to begin insurance authorization; will not receive approval until tomorrow due to holiday. [facility] indicated that the patient's son would have to recomplete paperwork for patient to return; patient's son indicated understanding. CSW will initiate insurance authorization for patient to return to [facility] tomorrow.  Employment status:  Retired Nurse, adult PT  Recommendations:  Not assessed at this time Information / Referral to community resources:  [place]  Patient/Family's Response to care:  Patient's son agreeable to return to SNF.  Patient/Family's Understanding of and Emotional Response to Diagnosis, Current Treatment, and Prognosis:  Patient's son seems to understand patient's current diagnosis and medical condition, even though he is upset about the patient's worsened status. Patient's son remains hopeful that the patient will be able to recover and improve to prior level of functioning. Patient's son indicated understanding of CSW role in discharge planning.  Emotional Assessment Appearance:  Appears stated age Attitude/Demeanor/Rapport:  Unable to Assess Affect (typically observed):  Unable to Assess Orientation:  Oriented to Self Alcohol / Substance use:  Not Applicable Psych involvement (Current and /or in the community):  No (Comment)  Discharge Needs  Concerns to be addressed:  Care Coordination, Discharge Planning Concerns Readmission within the last 30 days:  Yes Current discharge risk:  Physical Impairment, Cognitively Impaired Barriers to Discharge:  Continued Medical Work up, [place], [place] 10/23/2016, 11:44 AM

## 2016-10-24 ENCOUNTER — Encounter: Payer: Self-pay | Admitting: Internal Medicine

## 2016-10-24 NOTE — Progress Notes (Signed)
Pt discharge education and instructions completed. Pt IV removed; pt cleaned and new foam dsg applied to her sacrum pressure ulcer. Pt discharge back to [facility] SNF and report called off to nurse Leveda Anna at the facility. Awaiting on [facility] to pick pt up to transport. Will continue to monitor pt till pick up. P. Angelica Pou RN

## 2016-10-24 NOTE — Progress Notes (Signed)
  Speech Language Pathology Treatment: Dysphagia  Patient Details Name: Rebecca Knight MRN: [id number] DOB: [birth year] Today's Date: 10/24/2016 Time: 6578-4696 SLP Time Calculation (min) (ACUTE ONLY): 15 min  Assessment / Plan / Recommendation Clinical Impression  Pt has been started on comfort feedings, with a Dys 1 diet and thin liquids ordered by MD. She attempted a cup sip of water with the entire bolus spilling from her L side due to inadequate labial seal. SLP provided liquids by spoon with cough response noted that is concerning for aspiration. She tried only one bolus of nectar thick liquid before shaking her head "no" and pushing it away - seeming to indicate that she does not care for the thickened liquids. She consumed several small boluses of pureed solids with >30 seconds spent in oral transit with each trial. I believe that she did swallow miniscule amounts, but it appeared as though the majority of each bolus was pocketed on the L side and then anterior spilled from her lips.   Unfortunately she continues to exhibit signs of a significant dysphagia with concern for inadequate nutrition/hydration as well as aspiration; however, her son has elected for comfort feedings, and with that in mind I think her current diet is likely the most appropriate. F/u would be appropriate at SNF to facilitate safety during eating with emphasis on good oral care and safe feeding/swallowing strategies. SLP to s/o acutely as pt is on comfort feedings and plan is to d/c back to SNF today.   HPI HPI: Pt is a 83 y.o. female who was recently admitted for acute CVA before d/c to SNF on 6/28. She returned on 7/2 due to worsening L sided weakness and dysarthria. CT showed an acute R thalamus lacunar infarct. Pt had MBS during recent admission that revealed flash penetration of thin and nectar thick liquids, recommending Dys 1 diet and thin liquids. PMH includes dementia, HTN, CVA, anxiety, dysphagia      SLP Plan  Continue with current plan of care       Recommendations  Diet recommendations: Other(comment) (comfort feeds) Liquids provided via: Teaspoon Medication Administration: Crushed with puree Supervision: Full supervision/cueing for compensatory strategies Compensations: Minimize environmental distractions;Slow rate;Small sips/bites;Lingual sweep for clearance of pocketing;Monitor for anterior loss Postural Changes and/or Swallow Maneuvers: Seated upright 90 degrees;Upright 30-60 min after meal                Oral Care Recommendations: Oral care QID Follow up Recommendations: Skilled Nursing facility SLP Visit Diagnosis: Dysphagia, oropharyngeal phase (R13.12) Plan: Continue with current plan of care       GO                Germain Osgood 10/24/2016, 1:31 PM  Germain Osgood, M.A. CCC-SLP [phone]

## 2016-10-24 NOTE — Progress Notes (Signed)
Internal Medicine Attending  Date: 10/24/2016  Patient name: Rebecca Knight Medical record number: [id number] Date of birth: December 21, 1922 Age: 83 y.o. Gender: female  I saw and evaluated the patient. I reviewed the resident's note by Dr. Jerrell Mylar and I agree with the resident's findings and plans as documented in his progress note.  When seen on rounds this morning Rebecca Knight was sitting up trying to communicate via paper with one of the hospital administrators. She appeared much more alert and bright. That being said, she still had a left facial droop and flaccid paralysis on the left side. She denied any thirst or hunger but the speech evaluation noted significant dysphasia. We will continue with comfort feedings and transfer to [facility] for further comfort care.

## 2016-10-24 NOTE — Progress Notes (Signed)
Pt picked up by [facility] to be transported off to disposition. Pt transported off unit via stretcher with belongings to the side. P. Amo Shahzaib Azevedo RN 

## 2016-10-24 NOTE — Progress Notes (Signed)
Discharge to: [facility] Anticipated discharge date: 10/24/16 Family notified: Yes, at bedside Transportation by: [facility]  Report #: [phone], Room 307  CSW signing off.  Laveda Abbe LCSW [phone]

## 2016-10-24 NOTE — Progress Notes (Signed)
   Subjective: Rebecca Knight is sitting up in bed this am. She appears to be a little more active than yesterday. Still difficult to communicate with due to dysarthria but follows commands appropriately. Denies being in any pain. Is not thirsty or hungry at this point. Agreeable to being discharged back to [facility] today.   Objective:  Vital signs in last 24 hours: Vitals:   10/23/16 1728 10/23/16 2031 10/24/16 0040 10/24/16 0600  BP: (!) 162/96 (!) 161/90 (!) 170/99 (!) 174/95  Pulse: (!) 113 (!) 102 (!) 104 (!) 103  Resp: 20 (!) 22 20 20   Temp: 98.1 F (36.7 C) 98.2 F (36.8 C) (!) 97.4 F (36.3 C) 97.7 F (36.5 C)  TempSrc: Oral Axillary Oral Oral  SpO2: 99% 98% 97% 97%  Weight:      Height:       Physical Exam  Constitutional:  Sitting up in bed. In no apparent distress.   HENT:  Head: Normocephalic and atraumatic.  Eyes: Conjunctivae are normal. Pupils are equal, round, and reactive to light.  Cardiovascular: Normal rate, regular rhythm and normal heart sounds.   Pulmonary/Chest: Effort normal and breath sounds normal.  Neurological: She is alert.  Attempts to communicate. Still unable to grossly move her left arm and leg. Left facial droop.   Skin: Skin is warm and dry.    Assessment/Plan:  Principal Problem:   CVA (cerebral vascular accident) [facility]) Active Problems:   Essential hypertension   Acute ischemic vertebrobasilar artery thalamic stroke, right [facility])   History of stroke  Rebecca Knight is a 83 y.o. Female with a PMHx of CVA previously discharged on 10/17/2016 on Plavix and ASA presenting on 07/02/2018with worsening left sided weakness and dysarthria.   Goals of Care. Both Dr. Virgina Jock and myself had an in-depth conversation with Teala's son, Rebecca Knight, on 10/22/2016. Discussed the goals of care which essentially boil down to comfort. Still comfortable with IV fluids and requesting we continue the ASA suppository. Discussed attempting to eat and the severe risk of  aspiration. Rebecca Knight agreed that we would initiate a dysphagia diet for comfort only. Rebecca Knight would only attempt to eat, with assistance, if she elected. A feeding tube was discussed, Rebecca Knight elected not to do a feeding tube. Explained the risk of aspiration is still present and discussed the benefits versus the harm.  - Pending peer-to-peer for placement at [facility], discharge today   CVA.Recently discharged on 10/17/2016 on ASA and Plavix. Presented with worsening left sided weakness. Neurology consulted discussed option of pursuing aggressive work-up including CT angio head and neck, continue antiplatelet therapy, and pursue PT and OT.  - Speech evaluation completed. Severe risk of aspiration. Risks and benefits discussed with family.  - Will continue with ASA suppository.   Hypertension.Allow permissive HTN. Will attempt to restart oral medications on discharge; however, unlikely she will be able to swallow them.   Dispo: Anticipated discharge in approximately 0 day(s).   Ina Homes, MD 10/24/2016, 11:12 AM Pager: My Pager: [phone]

## 2016-10-24 NOTE — Progress Notes (Signed)
CSW has submitted information to [facility] for insurance authorization to return to [facility].  CSW will follow to update on insurance and facilitate discharge.  Laveda Abbe, [place] Clinical Social Worker [phone]

## 2016-10-24 NOTE — Progress Notes (Signed)
CSW following for insurance approval to DC to SNF. CSW contacted by [facility], requested peer-to-peer review. CSW contacted Resident MD to discuss, and contacted Apolonio Schneiders with [facility] [employer] to provide call back number for peer-to-peer review.  CSW will continue to follow to obtain insurance authorization for return to SNF.  Laveda Abbe, [place] Clinical Social Worker [phone]

## 2016-10-26 NOTE — Telephone Encounter (Deleted)
A user error has taken place: {error:[id number]}.

## 2016-10-28 NOTE — Telephone Encounter (Signed)
This encounter was created in error - please disregard.

## 2016-11-20 DEATH — deceased

## 2016-12-04 ENCOUNTER — Encounter: Payer: Self-pay | Admitting: Family

## 2016-12-09 ENCOUNTER — Ambulatory Visit: Payer: Medicare Other | Admitting: Family

## 2016-12-09 ENCOUNTER — Inpatient Hospital Stay (HOSPITAL_COMMUNITY): Admission: RE | Admit: 2016-12-09 | Payer: Medicare Other | Source: Ambulatory Visit

## 2016-12-16 ENCOUNTER — Ambulatory Visit: Payer: Medicare Other | Admitting: Family

## 2018-02-01 IMAGING — CR DG CHEST 1V
1 series · 1 of 1 positions shown · non-contrast
Comparison: 05/16/2015.

CLINICAL DATA: Unwitnessed fall

EXAM:
CHEST 1 VIEW

[x chest ap]
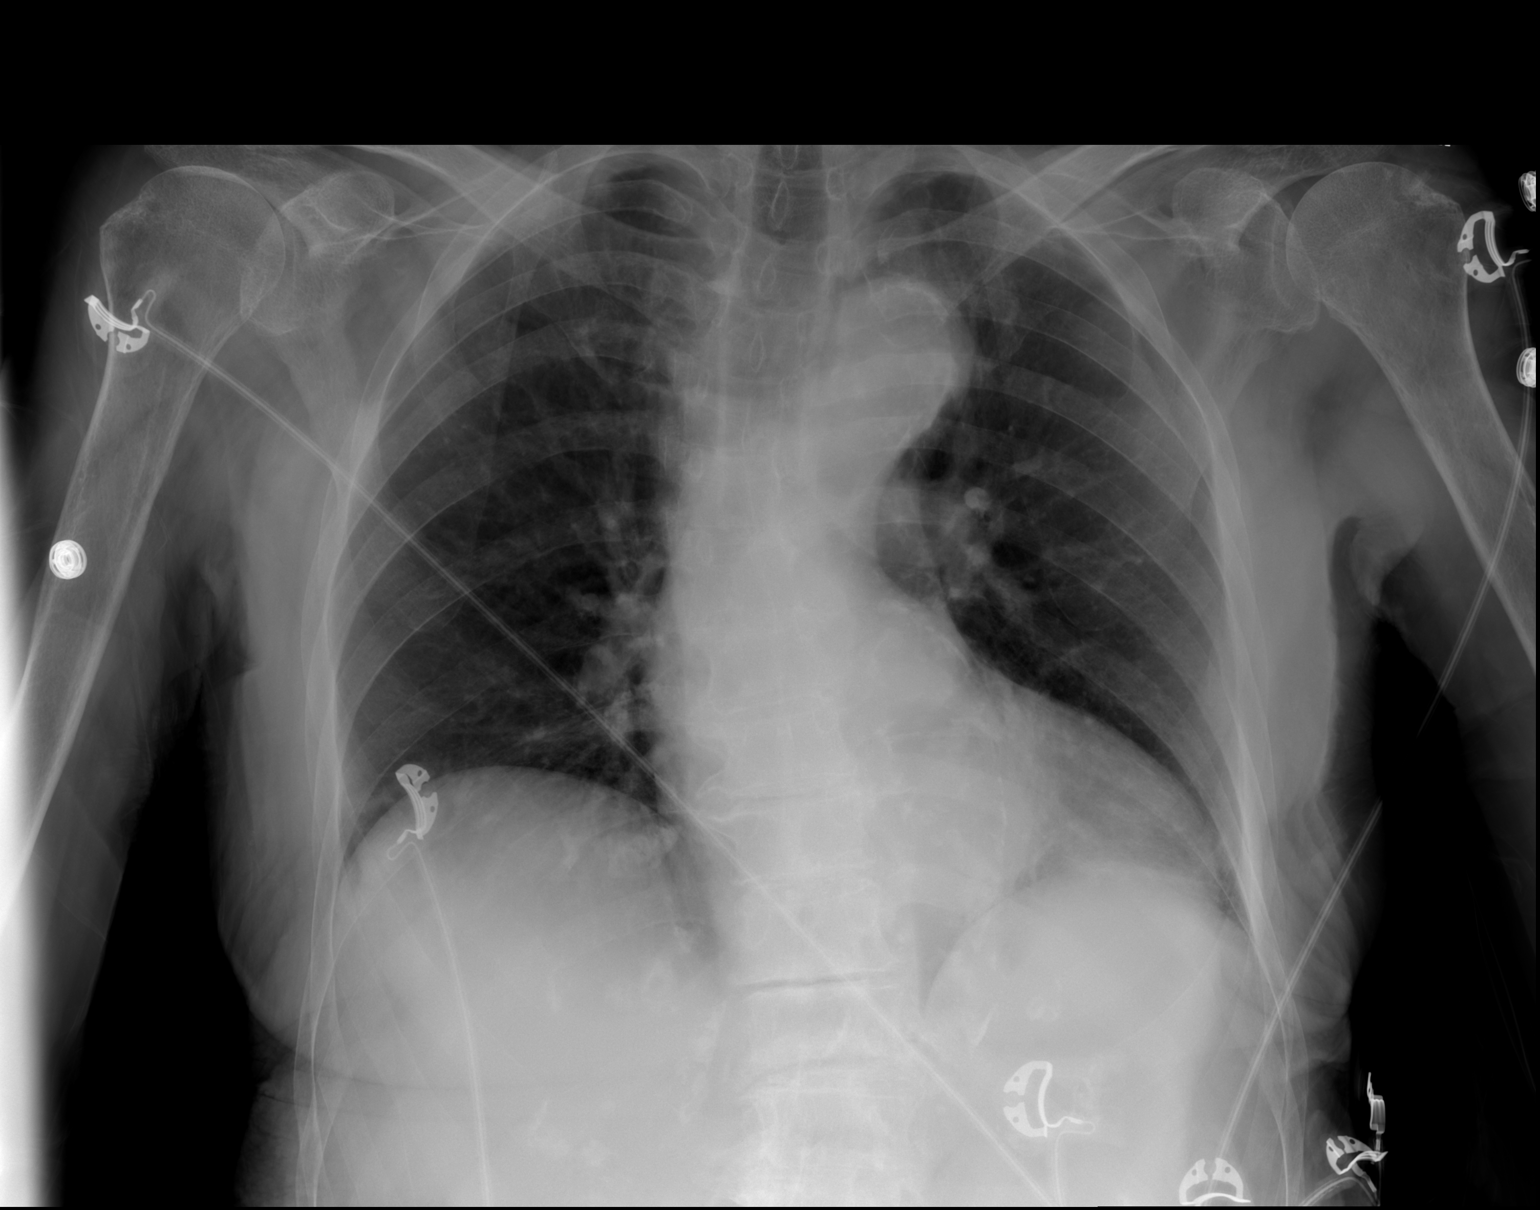

[1 of 1 positions shown; findings below may reference images not displayed]

FINDINGS: The heart size and mediastinal contours are within normal limits.
Aortic tortuosity and atherosclerosis noted. Both lungs are clear.
The visualized skeletal structures are unremarkable.
IMPRESSION: No active disease.

## 2018-02-02 IMAGING — CR DG HIP (WITH OR WITHOUT PELVIS) 1V PORT*R*
1 series · 1 of 1 positions shown · non-contrast
Comparison: None.

CLINICAL DATA: Closed dislocation of right hip

EXAM:
DG HIP (WITH OR WITHOUT PELVIS) 1V PORT RIGHT

[AP]
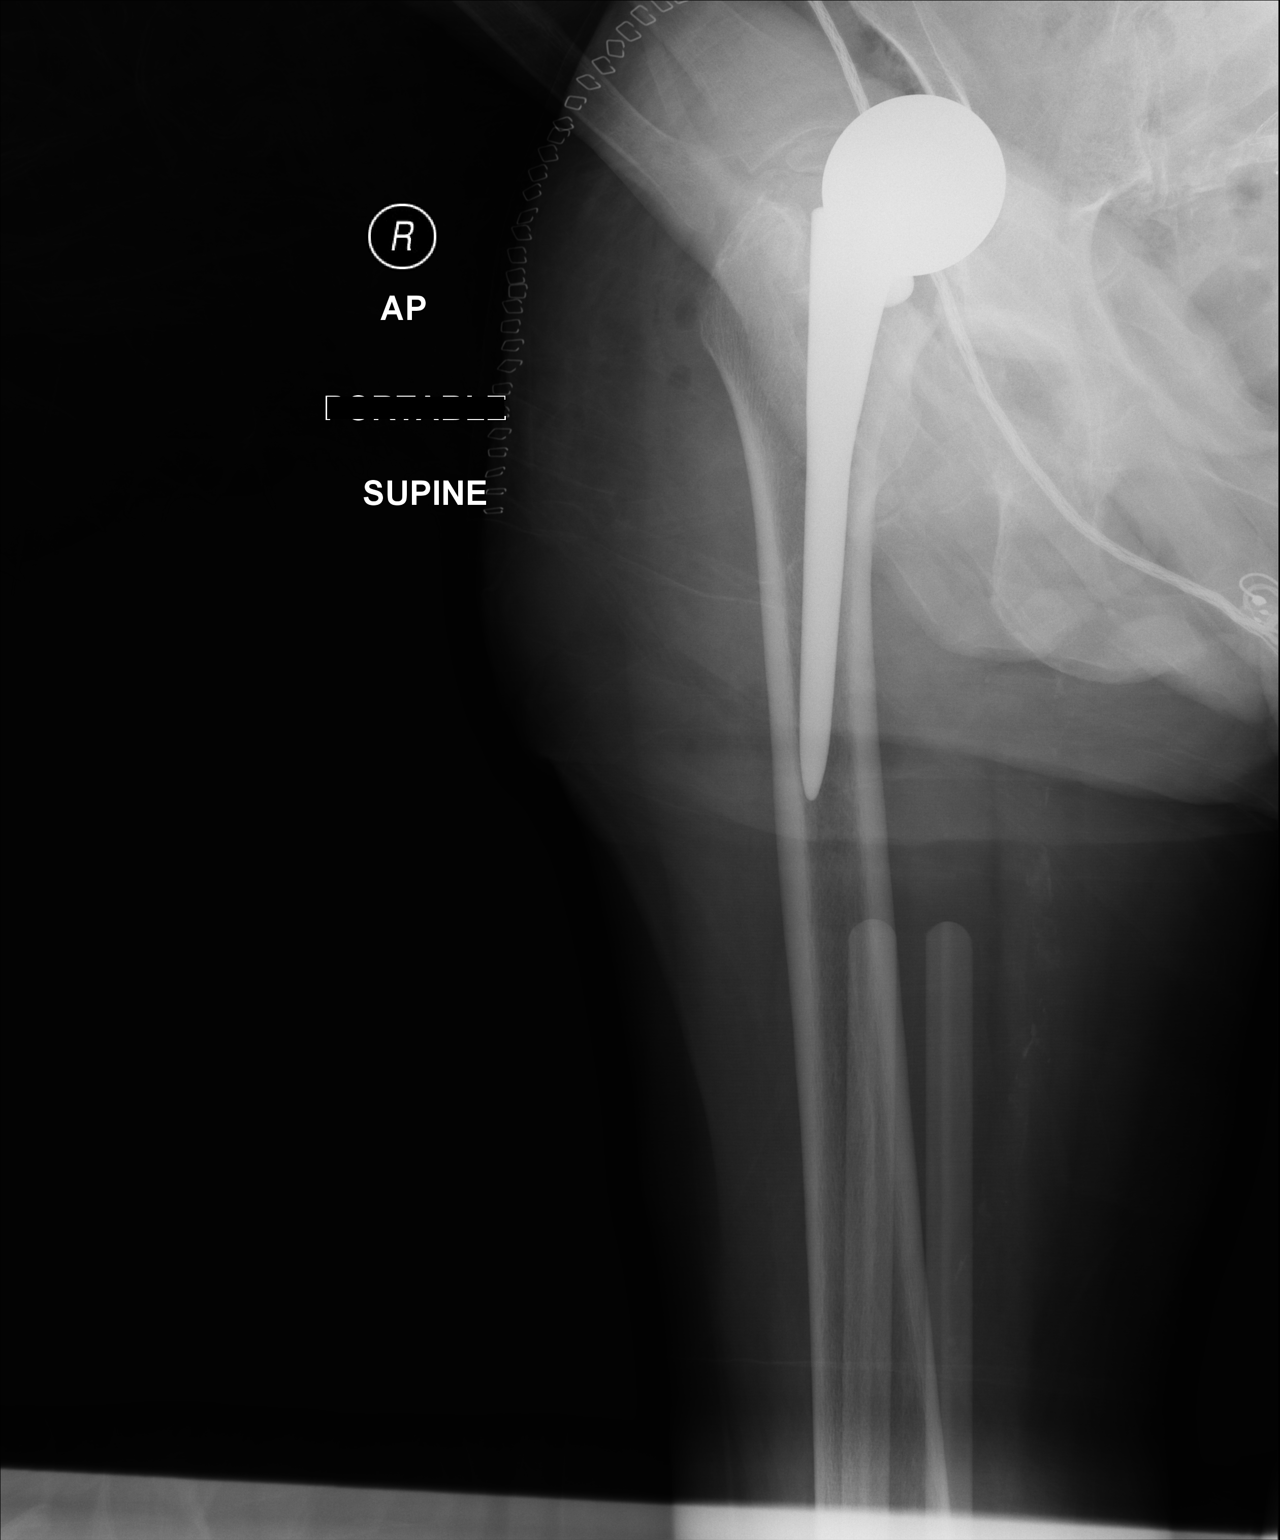

[1 of 1 positions shown; findings below may reference images not displayed]

FINDINGS: Patient has undergone right hip hemiarthroplasty. The femoral head
component is superiorly dislocated relative to the acetabulum.
Detail is limited with the patient's hand overlying the hip. Skin
clips overlie the soft tissues.
IMPRESSION: Persistent superior dislocation of the right hip.
